# Patient Record
Sex: Female | Born: 1965
Health system: Southern US, Community
[De-identification: ages and names within clinical notes are randomized; demographics above are authoritative.]

## PROBLEM LIST (undated history)

## (undated) DIAGNOSIS — M25562 Pain in left knee: Secondary | ICD-10-CM

## (undated) DIAGNOSIS — F32A Depression, unspecified: Secondary | ICD-10-CM

## (undated) DIAGNOSIS — R519 Headache, unspecified: Secondary | ICD-10-CM

## (undated) DIAGNOSIS — R51 Headache: Secondary | ICD-10-CM

## (undated) DIAGNOSIS — E7800 Pure hypercholesterolemia, unspecified: Secondary | ICD-10-CM

## (undated) DIAGNOSIS — M25519 Pain in unspecified shoulder: Secondary | ICD-10-CM

## (undated) DIAGNOSIS — E559 Vitamin D deficiency, unspecified: Secondary | ICD-10-CM

## (undated) DIAGNOSIS — F419 Anxiety disorder, unspecified: Secondary | ICD-10-CM

## (undated) DIAGNOSIS — M797 Fibromyalgia: Secondary | ICD-10-CM

## (undated) DIAGNOSIS — I700 Atherosclerosis of aorta: Secondary | ICD-10-CM

## (undated) DIAGNOSIS — D649 Anemia, unspecified: Secondary | ICD-10-CM

## (undated) DIAGNOSIS — R300 Dysuria: Secondary | ICD-10-CM

## (undated) DIAGNOSIS — F329 Major depressive disorder, single episode, unspecified: Secondary | ICD-10-CM

## (undated) DIAGNOSIS — N946 Dysmenorrhea, unspecified: Secondary | ICD-10-CM

## (undated) DIAGNOSIS — Q210 Ventricular septal defect: Secondary | ICD-10-CM

## (undated) DIAGNOSIS — Z0000 Encounter for general adult medical examination without abnormal findings: Secondary | ICD-10-CM

## (undated) DIAGNOSIS — G43909 Migraine, unspecified, not intractable, without status migrainosus: Secondary | ICD-10-CM

## (undated) DIAGNOSIS — Z79899 Other long term (current) drug therapy: Secondary | ICD-10-CM

## (undated) DIAGNOSIS — Q211 Atrial septal defect: Secondary | ICD-10-CM

## (undated) DIAGNOSIS — R5381 Other malaise: Secondary | ICD-10-CM

## (undated) DIAGNOSIS — E781 Pure hyperglyceridemia: Secondary | ICD-10-CM

## (undated) DIAGNOSIS — R5383 Other fatigue: Principal | ICD-10-CM

## (undated) DIAGNOSIS — R319 Hematuria, unspecified: Secondary | ICD-10-CM

## (undated) HISTORY — DX: Vitamin D deficiency, unspecified: E55.9

## (undated) HISTORY — DX: Migraine, unspecified, not intractable, without status migrainosus: G43.909

## (undated) HISTORY — DX: Dysuria: R30.0

## (undated) HISTORY — DX: Ventricular septal defect: Q21.0

## (undated) HISTORY — DX: Pain in unspecified shoulder: M25.519

## (undated) HISTORY — DX: Depression, unspecified: F32.A

## (undated) HISTORY — DX: Headache, unspecified: R51.9

## (undated) HISTORY — DX: Pain in left knee: M25.562

## (undated) HISTORY — DX: Anemia, unspecified: D64.9

## (undated) HISTORY — DX: Headache: R51

## (undated) HISTORY — PX: OTHER SURGICAL HISTORY: SHX169

## (undated) HISTORY — DX: Other malaise: R53.81

## (undated) HISTORY — PX: CARDIAC SURGERY: SHX584

## (undated) HISTORY — DX: Major depressive disorder, single episode, unspecified: F32.9

## (undated) HISTORY — PX: KNEE ARTHROSCOPY W/ ACL RECONSTRUCTION AND HAMSTRING GRAFT: SHX1860

## (undated) HISTORY — DX: Hematuria, unspecified: R31.9

## (undated) HISTORY — DX: Encounter for general adult medical examination without abnormal findings: Z00.00

## (undated) HISTORY — DX: Dysmenorrhea, unspecified: N94.6

## (undated) HISTORY — DX: Atrial septal defect: Q21.1

## (undated) HISTORY — DX: Other fatigue: R53.83

## (undated) HISTORY — DX: Pure hyperglyceridemia: E78.1

## (undated) HISTORY — DX: Atherosclerosis of aorta: I70.0

## (undated) HISTORY — DX: Other long term (current) drug therapy: Z79.899

## (undated) HISTORY — DX: Anxiety disorder, unspecified: F41.9

---

## 1998-02-22 ENCOUNTER — Other Ambulatory Visit: Admission: RE | Admit: 1998-02-22 | Discharge: 1998-02-22 | Payer: Self-pay | Admitting: Obstetrics and Gynecology

## 1999-06-26 ENCOUNTER — Other Ambulatory Visit: Admission: RE | Admit: 1999-06-26 | Discharge: 1999-06-26 | Payer: Self-pay | Admitting: Obstetrics and Gynecology

## 2000-05-15 ENCOUNTER — Other Ambulatory Visit: Admission: RE | Admit: 2000-05-15 | Discharge: 2000-05-15 | Payer: Self-pay | Admitting: Obstetrics and Gynecology

## 2000-07-23 ENCOUNTER — Ambulatory Visit (HOSPITAL_COMMUNITY): Admission: RE | Admit: 2000-07-23 | Discharge: 2000-07-23 | Payer: Self-pay | Admitting: Obstetrics & Gynecology

## 2000-07-23 ENCOUNTER — Encounter: Payer: Self-pay | Admitting: Obstetrics & Gynecology

## 2000-11-22 ENCOUNTER — Inpatient Hospital Stay (HOSPITAL_COMMUNITY): Admission: AD | Admit: 2000-11-22 | Discharge: 2000-11-22 | Payer: Self-pay | Admitting: Obstetrics and Gynecology

## 2000-12-10 ENCOUNTER — Inpatient Hospital Stay (HOSPITAL_COMMUNITY): Admission: AD | Admit: 2000-12-10 | Discharge: 2000-12-10 | Payer: Self-pay | Admitting: Obstetrics and Gynecology

## 2000-12-13 ENCOUNTER — Inpatient Hospital Stay (HOSPITAL_COMMUNITY): Admission: AD | Admit: 2000-12-13 | Discharge: 2000-12-16 | Payer: Self-pay | Admitting: Obstetrics and Gynecology

## 2001-10-13 ENCOUNTER — Other Ambulatory Visit: Admission: RE | Admit: 2001-10-13 | Discharge: 2001-10-13 | Payer: Self-pay | Admitting: Obstetrics and Gynecology

## 2004-06-13 ENCOUNTER — Other Ambulatory Visit: Admission: RE | Admit: 2004-06-13 | Discharge: 2004-06-13 | Payer: Self-pay | Admitting: Obstetrics and Gynecology

## 2004-06-28 ENCOUNTER — Ambulatory Visit: Payer: Self-pay | Admitting: Family Medicine

## 2006-05-09 ENCOUNTER — Inpatient Hospital Stay (HOSPITAL_COMMUNITY): Admission: AD | Admit: 2006-05-09 | Discharge: 2006-05-09 | Payer: Self-pay | Admitting: Obstetrics and Gynecology

## 2006-05-29 ENCOUNTER — Inpatient Hospital Stay (HOSPITAL_COMMUNITY): Admission: AD | Admit: 2006-05-29 | Discharge: 2006-05-30 | Payer: Self-pay | Admitting: Obstetrics and Gynecology

## 2008-11-11 ENCOUNTER — Encounter: Admission: RE | Admit: 2008-11-11 | Discharge: 2008-11-11 | Payer: Self-pay | Admitting: Family Medicine

## 2009-01-20 ENCOUNTER — Ambulatory Visit (HOSPITAL_COMMUNITY): Admission: RE | Admit: 2009-01-20 | Discharge: 2009-01-20 | Payer: Self-pay | Admitting: Family Medicine

## 2012-12-10 ENCOUNTER — Encounter: Payer: Self-pay | Admitting: Neurology

## 2012-12-10 DIAGNOSIS — R5381 Other malaise: Secondary | ICD-10-CM

## 2012-12-10 DIAGNOSIS — R319 Hematuria, unspecified: Secondary | ICD-10-CM

## 2012-12-10 DIAGNOSIS — M25519 Pain in unspecified shoulder: Secondary | ICD-10-CM | POA: Insufficient documentation

## 2012-12-10 DIAGNOSIS — F419 Anxiety disorder, unspecified: Secondary | ICD-10-CM

## 2012-12-10 DIAGNOSIS — E781 Pure hyperglyceridemia: Secondary | ICD-10-CM

## 2012-12-10 DIAGNOSIS — F329 Major depressive disorder, single episode, unspecified: Secondary | ICD-10-CM | POA: Insufficient documentation

## 2012-12-10 DIAGNOSIS — E559 Vitamin D deficiency, unspecified: Secondary | ICD-10-CM

## 2012-12-10 DIAGNOSIS — Z0000 Encounter for general adult medical examination without abnormal findings: Secondary | ICD-10-CM

## 2012-12-10 DIAGNOSIS — R519 Headache, unspecified: Secondary | ICD-10-CM

## 2012-12-10 DIAGNOSIS — G43909 Migraine, unspecified, not intractable, without status migrainosus: Secondary | ICD-10-CM

## 2012-12-10 DIAGNOSIS — F32A Depression, unspecified: Secondary | ICD-10-CM

## 2012-12-10 DIAGNOSIS — N946 Dysmenorrhea, unspecified: Secondary | ICD-10-CM

## 2012-12-10 DIAGNOSIS — Z79899 Other long term (current) drug therapy: Secondary | ICD-10-CM

## 2012-12-10 DIAGNOSIS — R300 Dysuria: Secondary | ICD-10-CM

## 2012-12-11 ENCOUNTER — Ambulatory Visit (INDEPENDENT_AMBULATORY_CARE_PROVIDER_SITE_OTHER): Payer: 59 | Admitting: Neurology

## 2012-12-11 ENCOUNTER — Encounter: Payer: Self-pay | Admitting: Neurology

## 2012-12-11 VITALS — BP 110/60 | HR 65 | Ht 65.5 in | Wt 152.0 lb

## 2012-12-11 DIAGNOSIS — F419 Anxiety disorder, unspecified: Secondary | ICD-10-CM

## 2012-12-11 DIAGNOSIS — F329 Major depressive disorder, single episode, unspecified: Secondary | ICD-10-CM

## 2012-12-11 DIAGNOSIS — G43909 Migraine, unspecified, not intractable, without status migrainosus: Secondary | ICD-10-CM

## 2012-12-11 DIAGNOSIS — F411 Generalized anxiety disorder: Secondary | ICD-10-CM

## 2012-12-11 MED ORDER — RIZATRIPTAN BENZOATE 10 MG PO TBDP: 10.0000 mg | ORAL_TABLET | ORAL | Status: DC | PRN

## 2012-12-11 MED ORDER — TOPIRAMATE 100 MG PO TABS: 100.0000 mg | ORAL_TABLET | Freq: Two times a day (BID) | ORAL | Status: DC

## 2012-12-11 NOTE — Progress Notes (Signed)
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 She used to have migraine headaches in her twenties, clustered around her menstruation period of time, Imitrex has been very helpful.  She underwent left knee arthroscopic surgery in November 2013, ever since then, she was no longer do same, she began to complain more frequent headaches, at the right parietal region, sometimes at the left side, severe pounding headaches, with associated light noise sensitivity, lasting for days to weeks, almost daily basis,   Over the past few months, she has tried different medications, Prozac, nortriptyline, mood swing, sleepy next morning, she has been taking topiramate 25 mg twice a day since July, which has helped her headache mildly  But she also complains of memory loss, difficulty focusing, 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   Most recent laboratory evaluation normal CMP, CBC, TSH, mild elevated LDL 141, negative HIV, A1c 5.1.   REVIEW OF SYSTEMS: Full 14 system review of systems performed and notable only for fatigue, ringing in ears, spinning sensation, blurred vision, double vision, eye pain, constipation, urination problems, feeling hot, memory loss, confusion, headaches, numbness, slurred speech, dizziness, tremor, insomnia, sleepiness, depression, anxiety, decreased energy.  ALLERGIES: Allergies  Allergen Reactions  . Latex   . Sulfa Antibiotics     HOME MEDICATIONS: Outpatient Prescriptions Prior to Visit  Medication Sig Dispense Refill  . FLUoxetine (PROZAC) 40 MG capsule Take 40 mg by mouth daily.      . SUMAtriptan (IMITREX) 50 MG tablet Take  50 mg by mouth every 2 (two) hours as needed for migraine.      . topiramate (TOPAMAX) 25 MG tablet Take 25 mg by mouth 2 (two) times daily.         PAST MEDICAL HISTORY: Past Medical History  Diagnosis Date  . Other malaise and fatigue   . Shoulder pain   . Dysmenorrhea   . Left knee pain   . Dysuria   . Hematuria   . Anxiety   . Vitamin D deficiency   . Pure hyperglyceridemia   . Vitamin D deficiency   . Headache   . Migraine   . Depression,      PAST SURGICAL HISTORY: Past Surgical History  Procedure Laterality Date  . C seaction    . Vetricular hole open heart surgery      FAMILY HISTORY: Family History  Problem Relation Age of Onset  . Depression Mother   . High blood pressure Mother   . Hyperlipidemia Mother   . Anxiety disorder Mother   . High blood pressure Father   . Hyperlipidemia Father     SOCIAL HISTORY:  History   Social History  . Marital Status: Married    Spouse Name: N/A    Number of Children: N/A  . Years of Education: N/A   Occupational History  . Register Nurse   Social History Main Topics  . Smoking status: Former Games developer  . Smokeless tobacco: Never Used  . Alcohol Use: No  . Drug Use: No  . Sexual Activity: Not on file    PHYSICAL EXAM    Filed Vitals:   12/11/12 0832  BP: 110/60  Pulse:  65  Height: 5' 5.5" (1.664 m)  Weight: 152 lb (68.947 kg)    Body mass index is 24.9 kg/(m^2).   Generalized: In no acute distress  Neck: Supple, no carotid bruits   Cardiac: Regular rate rhythm  Pulmonary: Clear to auscultation bilaterally  Musculoskeletal: No deformity  Neurological examination  Mentation: Alert oriented to time, place, history taking, and causual conversation  Cranial nerve II-XII: Pupils were equal round reactive to light extraocular movements were full, visual field were full on confrontational test. facial sensation and strength were normal. hearing was intact to finger rubbing bilaterally. Uvula  tongue midline.  head turning and shoulder shrug and were normal and symmetric.Tongue protrusion into cheek strength was normal.  Motor: normal tone, bulk and strength.  Sensory: Intact to fine touch, pinprick, preserved vibratory sensation, and proprioception at toes.  Coordination: Normal finger to nose, heel-to-shin bilaterally there was no truncal ataxia  Gait: Rising up from seated position without assistance, normal stance, without trunk ataxia, moderate stride, good arm swing, smooth turning, able to perform tiptoe, and heel walking without difficulty.   Romberg signs: Negative  Deep tendon reflexes: Brachioradialis 2/2, biceps 2/2, triceps 2/2, patellar 2/2, Achilles 2/2, plantar responses were flexor bilaterally.   DIAGNOSTIC DATA (LABS, IMAGING, TESTING) - I reviewed patient records, labs, notes, testing and imaging myself where available.    ASSESSMENT AND PLAN    47 years old right-handed Caucasian female, with past medical history of migraine headaches, now presenting with increased frequency almost daily headaches, and also difficulty concentrating she also complains of depression anxiety, confusion, memory loss, Mini-Mental Status Examination is 30 out of 30, exacerbated gait difficulty on examination, but with excellent balance.  1.  Differentiation diagnosis includes mood disorder, migraine headaches, 2.  Will increase her topiramate 100 mg twice a day 3.  MRI of the brain with and without contrast 4.  Return to clinic with Joanna Anderson in 3 months.  Joanna Anderson, M.D. Ph.D.  [facility] [address] [place], [place] [address] [phone]

## 2012-12-17 ENCOUNTER — Ambulatory Visit: Payer: 59 | Admitting: Neurology

## 2012-12-18 ENCOUNTER — Ambulatory Visit (INDEPENDENT_AMBULATORY_CARE_PROVIDER_SITE_OTHER): Payer: 59

## 2012-12-18 DIAGNOSIS — F419 Anxiety disorder, unspecified: Secondary | ICD-10-CM

## 2012-12-18 DIAGNOSIS — F411 Generalized anxiety disorder: Secondary | ICD-10-CM

## 2012-12-18 DIAGNOSIS — G43909 Migraine, unspecified, not intractable, without status migrainosus: Secondary | ICD-10-CM

## 2012-12-18 DIAGNOSIS — F329 Major depressive disorder, single episode, unspecified: Secondary | ICD-10-CM

## 2012-12-22 ENCOUNTER — Telehealth: Payer: Self-pay | Admitting: Neurology

## 2012-12-22 NOTE — Telephone Encounter (Signed)
Message copied by Demetrios Loll on Mon Dec 22, 2012  2:23 PM ------      Message from: Levert Feinstein      Created: Mon Dec 22, 2012 12:52 PM       Please call patient, mild abnormality related to her history of migraine, otherwise, normal. ------

## 2012-12-22 NOTE — Progress Notes (Signed)
Quick Note:  Please call patient, mild abnormality related to her history of migraine, otherwise, normal. ______

## 2012-12-22 NOTE — Telephone Encounter (Signed)
I have called Joanna Anderson at [phone] twice, trying to explain MRI findings to her, left message.  Mild changes related to migraine.

## 2012-12-22 NOTE — Telephone Encounter (Signed)
Called pt, no answer, left message to call back.

## 2012-12-22 NOTE — Telephone Encounter (Signed)
Pt called back and explain to the pt her results of the MRI. Pt had more questions and concerns. Pt wanted Dr. Terrace Arabia to return her phone call at [phone].

## 2013-04-08 ENCOUNTER — Ambulatory Visit: Payer: 59 | Admitting: Nurse Practitioner

## 2013-12-17 ENCOUNTER — Ambulatory Visit (INDEPENDENT_AMBULATORY_CARE_PROVIDER_SITE_OTHER): Payer: PRIVATE HEALTH INSURANCE | Admitting: Family Medicine

## 2013-12-17 VITALS — BP 142/78 | HR 71 | Temp 98.1°F | Resp 16 | Ht 66.25 in | Wt 159.4 lb

## 2013-12-17 DIAGNOSIS — R109 Unspecified abdominal pain: Secondary | ICD-10-CM

## 2013-12-17 LAB — POCT CBC
Granulocyte percent: 69.6 %G (ref 37–80)
HEMATOCRIT: 41.3 % (ref 37.7–47.9)
HEMOGLOBIN: 13.7 g/dL (ref 12.2–16.2)
LYMPH, POC: 2.9 (ref 0.6–3.4)
MCH, POC: 29.6 pg (ref 27–31.2)
MCHC: 33 g/dL (ref 31.8–35.4)
MCV: 89.6 fL (ref 80–97)
MID (cbc): 0.8 (ref 0–0.9)
MPV: 8.6 fL (ref 0–99.8)
POC GRANULOCYTE: 8.4 — AB (ref 2–6.9)
POC LYMPH PERCENT: 24.1 %L (ref 10–50)
POC MID %: 6.3 % (ref 0–12)
Platelet Count, POC: 186 10*3/uL (ref 142–424)
RBC: 4.61 M/uL (ref 4.04–5.48)
RDW, POC: 12.6 %
WBC: 12 10*3/uL — AB (ref 4.6–10.2)

## 2013-12-17 NOTE — Progress Notes (Addendum)
[facility] [address] [place] [address] [id number]  Date:  12/17/2013   Name:  Joanna Anderson   DOB:  04/10/1965   MRN:  [id number]  PCP:  Dennard Schaumann, MD    Chief Complaint: Abdominal Pain, Diarrhea, Nausea and Chills   History of Present Illness:  Joanna Anderson is a 48 y.o. very pleasant female patient who presents with the following:  2 days ago she felt quite nauseated, bloated, dull pain in her belly.  Yesterday she had more pain, today the pain continues. Her sx "really started about a month ago-" she had a period of "pooping straight mucus" for a couple of days.  She was still eating normally.  Then she had bad diarrhea for 5 days or so, but she was able to work and eat normally.  Since then her belly has not felt right, the mucus never totally resolved.  Over the last 5 days she has noted chills and felt tired. She has noted a low grade fever- she was 99.4 today. She has not noted any blood in her stool.   Yesterday she took miralax.   She has not been able to eat today.  She has drunk a little bit of fluid.     About 5 years ago she had a CT which showed diverticulitis and segmental sigmoid colonic wall thickening.   CT from 2010   IMPRESSION:  No acute intra-abdominal finding. Please see CT pelvis report  below.  CT PELVIS  Findings: There is short segmental sigmoid colonic wall thickening  with surrounding pericolonic stranding and trace fluid tracking  inferiorly to the pelvis. No definite pericolonic abscess  formation is seen. An exophytic nodule on the superior aspect of  this abnormal appearing loop of bowel most likely represents an  inflamed diverticulum. The uterus has a lobulated contour which  may suggest underlying fibroids. Ovaries are normal in appearance.  Small bowel is unremarkable. No acute osseous finding.  IMPRESSION:  Short segmental sigmoid colonic wall thickening, pericolonic  stranding, and free fluid,  likely indicative of diverticulitis,  much less likely mass lesion, without free air or definite  pericolonic abscess formation.  She has not yet seen GI.  She will feel fine and then forget about her sx, but knows that she likely needs to see GI soon.   Her father had maybe Crohns disease?   Patient Active Problem List   Diagnosis Date Noted  . Other malaise and fatigue   . Routine general medical examination at a health care facility   . Shoulder pain   . Dysmenorrhea   . Dysuria   . Hematuria   . Anxiety   . Encounter for long-term (current) use of other medications   . Vitamin D deficiency   . Pure hyperglyceridemia   . Headache   . Migraine   . Depression     Past Medical History  Diagnosis Date  . Other malaise and fatigue   . Routine general medical examination at a health care facility   . Shoulder pain   . Dysmenorrhea   . Left knee pain   . Dysuria   . Hematuria   . Anxiety   . Encounter for long-term (current) use of other medications   . Vitamin D deficiency   . Pure hyperglyceridemia   . Vitamin D deficiency   . Headache   . Migraine   . Depression   . Anemia     Past  Surgical History  Procedure Laterality Date  . C seaction    . Vetricular hole      History  Substance Use Topics  . Smoking status: Former Games developer  . Smokeless tobacco: Never Used  . Alcohol Use: No    Family History  Problem Relation Age of Onset  . Depression Mother   . High blood pressure Mother   . Hyperlipidemia Mother   . Anxiety disorder Mother   . Hypertension Mother   . High blood pressure Father   . Hyperlipidemia Father   . Hypertension Father     Allergies  Allergen Reactions  . Latex   . Sulfa Antibiotics     Medication list has been reviewed and updated.  Current Outpatient Prescriptions on File Prior to Visit  Medication Sig Dispense Refill  . FLUoxetine (PROZAC) 40 MG capsule Take 40 mg by mouth daily.      . rizatriptan (MAXALT-MLT) 10 MG  disintegrating tablet Take 1 tablet (10 mg total) by mouth as needed for migraine. May repeat in 2 hours if needed  16 tablet  12  . SUMAtriptan (IMITREX) 50 MG tablet Take 50 mg by mouth every 2 (two) hours as needed for migraine.      . topiramate (TOPAMAX) 100 MG tablet Take 1 tablet (100 mg total) by mouth 2 (two) times daily.  60 tablet  12   No current facility-administered medications on file prior to visit.    Review of Systems:  As per HPI- otherwise negative.   Physical Examination: Filed Vitals:   12/17/13 1737  BP: 142/78  Pulse: 71  Temp: 98.1 F (36.7 C)  Resp: 16   Filed Vitals:   12/17/13 1737  Height: 5' 6.25" (1.683 m)  Weight: 159 lb 6.4 oz (72.303 kg)   Body mass index is 25.53 kg/(m^2). Ideal Body Weight: Weight in (lb) to have BMI = 25: 155.7  GEN: WDWN, NAD, Non-toxic, A & O x 3 HEENT: Atraumatic, Normocephalic. Neck supple. No masses, No LAD. Ears and Nose: No external deformity. CV: RRR, No M/G/R. No JVD. No thrill. No extra heart sounds. PULM: CTA B, no wheezes, crackles, rhonchi. No retractions. No resp. distress. No accessory muscle use. ABD: S,  ND, +BS. No rebound. No HSM.  Diffuse abdominal tenderness worse over the lower quadrants  EXTR: No c/c/e NEURO Normal gait.  PSYCH: Normally interactive. Conversant. Not depressed or anxious appearing.  Calm demeanor.    Assessment and Plan: Abdominal pain, other specified site - Plan: POCT CBC, POCT UA - Microscopic Only, POCT urinalysis dipstick, POCT urine pregnancy, Comprehensive metabolic panel  As it turns out she is a [employer] employee, she would like to go there as she likely needs a CT scan and this will be less expensive for her.  She will go to the [facility] ED now; she feels able to driver herself. Given a copy of her records and called ahead to ED for her.    Signed Abbe Amsterdam, MD

## 2013-12-18 LAB — COMPREHENSIVE METABOLIC PANEL
ALBUMIN: 4.3 g/dL (ref 3.5–5.2)
ALT: 19 U/L (ref 0–35)
AST: 15 U/L (ref 0–37)
Alkaline Phosphatase: 58 U/L (ref 39–117)
BUN: 9 mg/dL (ref 6–23)
CALCIUM: 9.2 mg/dL (ref 8.4–10.5)
CHLORIDE: 101 meq/L (ref 96–112)
CO2: 27 meq/L (ref 19–32)
Creat: 0.69 mg/dL (ref 0.50–1.10)
GLUCOSE: 83 mg/dL (ref 70–99)
POTASSIUM: 3.8 meq/L (ref 3.5–5.3)
SODIUM: 138 meq/L (ref 135–145)
TOTAL PROTEIN: 7.2 g/dL (ref 6.0–8.3)
Total Bilirubin: 1.1 mg/dL (ref 0.2–1.2)

## 2013-12-20 ENCOUNTER — Encounter: Payer: Self-pay | Admitting: Family Medicine

## 2014-03-11 ENCOUNTER — Ambulatory Visit (INDEPENDENT_AMBULATORY_CARE_PROVIDER_SITE_OTHER): Payer: Commercial Managed Care - PPO | Admitting: Family Medicine

## 2014-03-11 VITALS — BP 120/70 | HR 78 | Temp 98.4°F | Resp 16 | Ht 66.5 in | Wt 164.2 lb

## 2014-03-11 DIAGNOSIS — K279 Peptic ulcer, site unspecified, unspecified as acute or chronic, without hemorrhage or perforation: Secondary | ICD-10-CM

## 2014-03-11 DIAGNOSIS — F411 Generalized anxiety disorder: Secondary | ICD-10-CM

## 2014-03-11 DIAGNOSIS — G43101 Migraine with aura, not intractable, with status migrainosus: Secondary | ICD-10-CM

## 2014-03-11 MED ORDER — CLONAZEPAM 0.5 MG PO TABS: 0.5000 mg | ORAL_TABLET | Freq: Two times a day (BID) | ORAL | Status: DC | PRN

## 2014-03-11 MED ORDER — RIZATRIPTAN BENZOATE 10 MG PO TBDP: 10.0000 mg | ORAL_TABLET | ORAL | Status: DC | PRN

## 2014-03-11 MED ORDER — TIZANIDINE HCL 4 MG PO TABS: 2.0000 mg | ORAL_TABLET | Freq: Three times a day (TID) | ORAL | Status: DC | PRN

## 2014-03-11 NOTE — Patient Instructions (Signed)
Use the Klonopin when needed  Use the Maxalt when an obvious migraine is coming on.  If for a migraine sets in and is persisting, try the tizanidine one half tablet every 8 hours. May cause drowsiness. Use initially when not working.  Return if worse at anytime, otherwise in about 3 months please return so we can review how you are doing.

## 2014-03-11 NOTE — Progress Notes (Signed)
Subjective: 48 year old lady here because of her migraines. She had a bad one last night with a lot of aura associated with it. She took some promethazine today for it. She has a history of having used Maxalt successfully in the past, but has not had any of late. She is seeing a neurologist in the past, but did not feel like the continued increase of various prophylactic medications was helping her. She has a history of chronic GI problems for which she's been evaluated at [facility]. Her reports are available through the care everywhere network. She has a history of some sleep disturbance for which she took some Klonopin, and is using some of it for sleep and anxiety. Her medicine list is in the chart, with the exception of Protonix which did not get listed. She takes 40 mg daily. She has an ulcer, so cannot take NSAIDs. She has had a complete GI workup. H. pylori and stool cultures were negative except first finding a strain of Escherichia coli from her well water which may have been contributing to this. She was treated for that and did better. C. difficile negative.  Currently she takes the Klonopin intermittently, dicyclomine intermittently, Protonix routinely, sucralfate occasionally, and Phenergan fairly often. She has tried tramadol unsuccessfully. Cannot take NSAIDs due to the ulcer.  Objective: Healthy-appearing lady. TMs normal. Eyes PERRLA. EOMs intact. Throat clear. Neck supple without nodes. Chest clear to auscultation. Heart regular without murmurs. Abdomen soft without mass or tenderness.  Assessment: Migraines History of peptic ulcer disease History of anxiety History of sleep disturbance History of chronic intermittent GI distress, in part secondary to a pathogenic Escherichia coli infection  Plan: Discussed her desire to have a regular physician. Explained that she can come in here and ask for me on a routine basis. She can call and find out when I'm here  before coming. We will see how that works for her.  Continue current medications. Give her some Maxalt for when necessary use. Try tizanidine when she has a bad migraine  Return as needed

## 2014-05-17 ENCOUNTER — Ambulatory Visit (INDEPENDENT_AMBULATORY_CARE_PROVIDER_SITE_OTHER): Payer: Commercial Managed Care - PPO | Admitting: Family Medicine

## 2014-05-17 VITALS — BP 82/60 | HR 63 | Temp 98.3°F | Resp 16 | Ht 65.5 in | Wt 162.0 lb

## 2014-05-17 DIAGNOSIS — G43009 Migraine without aura, not intractable, without status migrainosus: Secondary | ICD-10-CM

## 2014-05-17 DIAGNOSIS — R110 Nausea: Secondary | ICD-10-CM

## 2014-05-17 DIAGNOSIS — M1712 Unilateral primary osteoarthritis, left knee: Secondary | ICD-10-CM

## 2014-05-17 DIAGNOSIS — R21 Rash and other nonspecific skin eruption: Secondary | ICD-10-CM

## 2014-05-17 DIAGNOSIS — K279 Peptic ulcer, site unspecified, unspecified as acute or chronic, without hemorrhage or perforation: Secondary | ICD-10-CM

## 2014-05-17 DIAGNOSIS — M25562 Pain in left knee: Secondary | ICD-10-CM

## 2014-05-17 DIAGNOSIS — F411 Generalized anxiety disorder: Secondary | ICD-10-CM

## 2014-05-17 DIAGNOSIS — R1084 Generalized abdominal pain: Secondary | ICD-10-CM

## 2014-05-17 DIAGNOSIS — M179 Osteoarthritis of knee, unspecified: Secondary | ICD-10-CM

## 2014-05-17 DIAGNOSIS — Z0000 Encounter for general adult medical examination without abnormal findings: Secondary | ICD-10-CM

## 2014-05-17 DIAGNOSIS — G8929 Other chronic pain: Secondary | ICD-10-CM

## 2014-05-17 LAB — POCT CBC
GRANULOCYTE PERCENT: 66.1 % (ref 37–80)
HCT, POC: 38.9 % (ref 37.7–47.9)
Hemoglobin: 12.6 g/dL (ref 12.2–16.2)
Lymph, poc: 2.3 (ref 0.6–3.4)
MCH, POC: 29.2 pg (ref 27–31.2)
MCHC: 32.4 g/dL (ref 31.8–35.4)
MCV: 90.2 fL (ref 80–97)
MID (CBC): 0.6 (ref 0–0.9)
MPV: 7.9 fL (ref 0–99.8)
POC GRANULOCYTE: 5.7 (ref 2–6.9)
POC LYMPH PERCENT: 26.8 %L (ref 10–50)
POC MID %: 7.1 %M (ref 0–12)
Platelet Count, POC: 185 10*3/uL (ref 142–424)
RBC: 4.31 M/uL (ref 4.04–5.48)
RDW, POC: 13.6 %
WBC: 8.6 10*3/uL (ref 4.6–10.2)

## 2014-05-17 LAB — POCT GLYCOSYLATED HEMOGLOBIN (HGB A1C): Hemoglobin A1C: 5.2

## 2014-05-17 MED ORDER — CLINDAMYCIN PHOSPHATE 1 % EX GEL: Freq: Two times a day (BID) | CUTANEOUS | Status: DC

## 2014-05-17 MED ORDER — ONDANSETRON 8 MG PO TBDP: 8.0000 mg | ORAL_TABLET | Freq: Three times a day (TID) | ORAL | Status: DC | PRN

## 2014-05-17 MED ORDER — CLONAZEPAM 0.5 MG PO TABS
0.5000 mg | ORAL_TABLET | Freq: Two times a day (BID) | ORAL | Status: DC | PRN
Start: 2014-05-17 — End: 2014-07-19

## 2014-05-17 MED ORDER — DICLOFENAC SODIUM 1 % TD GEL: 2.0000 g | Freq: Four times a day (QID) | TRANSDERMAL | Status: DC

## 2014-05-17 NOTE — Progress Notes (Signed)
Physical examination:  History: Patient is here for a physical examination. She has no major acute complaints  Past medical history: Surgeries: Had surgery for a congenital heart atrial septal ventricular defect when she was a child. Has had surgery left knee  major illnesses: Chronic migraines, peptic ulcer disease, anxiety disorder Medications: See chart Allergies: Sulfa and latex  Family history: No major congenital problems  Social history: Not using substances  Review of systems: HEENT: Unremarkable Constitutional: Unremarkable Does not get a lot of regular physical exercise Cardiovascular: Unremarkable, no problems from her heart, has been followed up with a cardiologist a couple of years ago Respiratory: Unremarkable Gastrointestinal: Chronic abdominal pain, has the ulcer. Consideration was given to getting a HIDA scan but it was never done Genitourinary: Unremarkable Muscular skeletal: Unremarkable Endocrine: Unremarkable Dermatologic: Unremarkable Neurologic: Migraines as discussed above  Physical exam: Healthy-appearing lady in no major distress. TMs normal. Eyes PERRLA. Throat clear. Neck supple without nodes. Chest clear. Heart regular without murmurs. Abdomen soft without mass or tenderness. Breasts are symmetrical without any nodules. Small breast. No axillary nodes. Extremities normal. Skin normal.  Assessment: Normal physical examination Migraines, chronic Peptic ulcer disease Chronic abdominal pain History of congenital heart disease Rash on forehead, DJD and chronic pain left knee  Plan: Diclofenac gel for the knee Clindamycin gel for the forehead See someone at the [facility] for evaluation of the migraines. She did have some mild abnormality on the MRI she needs to discuss with them also Probably does not need a mammogram until she is age 49. No family history Wait another year or 2 for a Pap smear     Results for orders placed or  performed in visit on 05/17/14  POCT CBC  Result Value Ref Range   WBC 8.6 4.6 - 10.2 K/uL   Lymph, poc 2.3 0.6 - 3.4   POC LYMPH PERCENT 26.8 10 - 50 %L   MID (cbc) 0.6 0 - 0.9   POC MID % 7.1 0 - 12 %M   POC Granulocyte 5.7 2 - 6.9   Granulocyte percent 66.1 37 - 80 %G   RBC 4.31 4.04 - 5.48 M/uL   Hemoglobin 12.6 12.2 - 16.2 g/dL   HCT, POC 40.938.9 81.137.7 - 47.9 %   MCV 90.2 80 - 97 fL   MCH, POC 29.2 27 - 31.2 pg   MCHC 32.4 31.8 - 35.4 g/dL   RDW, POC 91.413.6 %   Platelet Count, POC 185 142 - 424 K/uL   MPV 7.9 0 - 99.8 fL  POCT glycosylated hemoglobin (Hb A1C)  Result Value Ref Range   Hemoglobin A1C 5.2    Other labs are all pending

## 2014-05-17 NOTE — Patient Instructions (Addendum)
Use the diclofenac gel 2-4 times daily rubbed into the knee  Tried to get the exercise that you safely can without impacting your knee badly  Use the clindamycin gel on your face once or twice daily after washing face  Referral is being made to a neurologist  Return back to your gastroenterologist for further assessment of the ulcer and possible additional workup  Return in one year for physical

## 2014-05-18 LAB — LIPID PANEL
Cholesterol: 207 mg/dL — ABNORMAL HIGH (ref 0–200)
HDL: 46 mg/dL (ref >39)
LDL Cholesterol: 133 mg/dL — ABNORMAL HIGH (ref 0–99)
TRIGLYCERIDES: 141 mg/dL (ref <150)
Total CHOL/HDL Ratio: 4.5 Ratio
VLDL: 28 mg/dL (ref 0–40)

## 2014-05-18 LAB — TSH: TSH: 1.012 u[IU]/mL (ref 0.350–4.500)

## 2014-05-28 ENCOUNTER — Telehealth: Payer: Self-pay

## 2014-05-28 DIAGNOSIS — G43009 Migraine without aura, not intractable, without status migrainosus: Secondary | ICD-10-CM

## 2014-05-28 NOTE — Telephone Encounter (Signed)
Pt is thinking that r hopper was referring her to the neurologist that his wife goes to   Pt is having headaches daily

## 2014-05-28 NOTE — Telephone Encounter (Signed)
Can we refer for headaches? Please advise.

## 2014-05-29 NOTE — Telephone Encounter (Signed)
Call I had intended for her to be referred to [facility] re: headaches.  Please do so for me. Apologize to her that this did not get done last week.

## 2014-05-31 NOTE — Telephone Encounter (Signed)
Referral placed. Called to let her know. Left message.

## 2014-06-03 ENCOUNTER — Telehealth: Payer: Self-pay | Admitting: Family Medicine

## 2014-06-03 NOTE — Telephone Encounter (Signed)
Patient is requesting Tramadol to help with her knee and back pain. States that she and Dr. Alwyn RenHopper discussed her getting on this medication to help ease her pain. [facility].  [phone]

## 2014-06-04 NOTE — Telephone Encounter (Signed)
Patient is calling back asking for pain relief.  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   [facility]   [phone]  Call patient when it is called in.    [facility] [address]

## 2014-06-04 NOTE — Telephone Encounter (Signed)
Please send in a prescription for her for Tramadol 50 mg  One bid prn severe pain only   #40   One refill.  She needs to come in after that for further refills.  Recommended for only when needed for bad pain, should try to minimize use.

## 2014-06-05 ENCOUNTER — Emergency Department (HOSPITAL_COMMUNITY)
Admission: EM | Admit: 2014-06-05 | Discharge: 2014-06-05 | Disposition: A | Discharge status: Home / Self Care | Payer: 59 | Attending: Emergency Medicine | Admitting: Emergency Medicine

## 2014-06-05 ENCOUNTER — Emergency Department (HOSPITAL_COMMUNITY): Payer: 59

## 2014-06-05 ENCOUNTER — Encounter (HOSPITAL_COMMUNITY): Payer: Self-pay | Admitting: *Deleted

## 2014-06-05 DIAGNOSIS — R079 Chest pain, unspecified: Secondary | ICD-10-CM | POA: Diagnosis present

## 2014-06-05 DIAGNOSIS — Z87891 Personal history of nicotine dependence: Secondary | ICD-10-CM | POA: Diagnosis not present

## 2014-06-05 DIAGNOSIS — Z8742 Personal history of other diseases of the female genital tract: Secondary | ICD-10-CM | POA: Insufficient documentation

## 2014-06-05 DIAGNOSIS — M542 Cervicalgia: Secondary | ICD-10-CM | POA: Insufficient documentation

## 2014-06-05 DIAGNOSIS — F419 Anxiety disorder, unspecified: Secondary | ICD-10-CM | POA: Diagnosis not present

## 2014-06-05 DIAGNOSIS — Z791 Long term (current) use of non-steroidal anti-inflammatories (NSAID): Secondary | ICD-10-CM | POA: Diagnosis not present

## 2014-06-05 DIAGNOSIS — R011 Cardiac murmur, unspecified: Secondary | ICD-10-CM | POA: Diagnosis not present

## 2014-06-05 DIAGNOSIS — G43909 Migraine, unspecified, not intractable, without status migrainosus: Secondary | ICD-10-CM | POA: Diagnosis not present

## 2014-06-05 DIAGNOSIS — Z79899 Other long term (current) drug therapy: Secondary | ICD-10-CM | POA: Insufficient documentation

## 2014-06-05 DIAGNOSIS — Z792 Long term (current) use of antibiotics: Secondary | ICD-10-CM | POA: Insufficient documentation

## 2014-06-05 DIAGNOSIS — Z862 Personal history of diseases of the blood and blood-forming organs and certain disorders involving the immune mechanism: Secondary | ICD-10-CM | POA: Insufficient documentation

## 2014-06-05 DIAGNOSIS — Z8639 Personal history of other endocrine, nutritional and metabolic disease: Secondary | ICD-10-CM | POA: Diagnosis not present

## 2014-06-05 HISTORY — DX: Pure hypercholesterolemia, unspecified: E78.00

## 2014-06-05 LAB — CBC WITH DIFFERENTIAL/PLATELET
BASOS ABS: 0 10*3/uL (ref 0.0–0.1)
BASOS PCT: 0 % (ref 0–1)
EOS ABS: 0.2 10*3/uL (ref 0.0–0.7)
EOS PCT: 3 % (ref 0–5)
HCT: 40.5 % (ref 36.0–46.0)
Hemoglobin: 13.9 g/dL (ref 12.0–15.0)
LYMPHS ABS: 2.5 10*3/uL (ref 0.7–4.0)
Lymphocytes Relative: 40 % (ref 12–46)
MCH: 30.1 pg (ref 26.0–34.0)
MCHC: 34.3 g/dL (ref 30.0–36.0)
MCV: 87.7 fL (ref 78.0–100.0)
Monocytes Absolute: 0.5 10*3/uL (ref 0.1–1.0)
Monocytes Relative: 8 % (ref 3–12)
NEUTROS PCT: 49 % (ref 43–77)
Neutro Abs: 3.1 10*3/uL (ref 1.7–7.7)
PLATELETS: 195 10*3/uL (ref 150–400)
RBC: 4.62 MIL/uL (ref 3.87–5.11)
RDW: 12.4 % (ref 11.5–15.5)
WBC: 6.2 10*3/uL (ref 4.0–10.5)

## 2014-06-05 LAB — BASIC METABOLIC PANEL
Anion gap: 9 (ref 5–15)
BUN: 9 mg/dL (ref 6–23)
CO2: 28 mmol/L (ref 19–32)
Calcium: 8.8 mg/dL (ref 8.4–10.5)
Chloride: 102 mmol/L (ref 96–112)
Creatinine, Ser: 0.63 mg/dL (ref 0.50–1.10)
GFR calc Af Amer: >90 mL/min (ref >90)
GFR calc non Af Amer: >90 mL/min (ref >90)
GLUCOSE: 89 mg/dL (ref 70–99)
Potassium: 4 mmol/L (ref 3.5–5.1)
Sodium: 139 mmol/L (ref 135–145)

## 2014-06-05 LAB — I-STAT TROPONIN, ED: Troponin i, poc: 0 ng/mL (ref 0.00–0.08)

## 2014-06-05 MED ORDER — SODIUM CHLORIDE 0.9 % IV SOLN
Freq: Once | INTRAVENOUS | Status: AC
  Administered 2014-06-05: 11:00:00 via INTRAVENOUS

## 2014-06-05 MED ORDER — TRAMADOL HCL 50 MG PO TABS: 50.0000 mg | ORAL_TABLET | Freq: Two times a day (BID) | ORAL | Status: DC | PRN

## 2014-06-05 MED ORDER — IOHEXOL 350 MG/ML SOLN
100.0000 mL | Freq: Once | INTRAVENOUS | Status: AC | PRN
  Administered 2014-06-05: 100 mL via INTRAVENOUS

## 2014-06-05 NOTE — Discharge Instructions (Signed)
Chest Pain (Nonspecific) It is often hard to give a specific diagnosis for the cause of chest pain. There is always a chance that your pain could be related to something serious, such as a heart attack or a blood clot in the lungs. You need to follow up with your health care provider for further evaluation. CAUSES   Heartburn.  Pneumonia or bronchitis.  Anxiety or stress.  Inflammation around your heart (pericarditis) or lung (pleuritis or pleurisy).  A blood clot in the lung.  A collapsed lung (pneumothorax). It can develop suddenly on its own (spontaneous pneumothorax) or from trauma to the chest.  Shingles infection (herpes zoster virus). The chest wall is composed of bones, muscles, and cartilage. Any of these can be the source of the pain.  The bones can be bruised by injury.  The muscles or cartilage can be strained by coughing or overwork.  The cartilage can be affected by inflammation and become sore (costochondritis). DIAGNOSIS  Lab tests or other studies may be needed to find the cause of your pain. Your health care provider may have you take a test called an ambulatory electrocardiogram (ECG). An ECG records your heartbeat patterns over a 24-hour period. You may also have other tests, such as:  Transthoracic echocardiogram (TTE). During echocardiography, sound waves are used to evaluate how blood flows through your heart.  Transesophageal echocardiogram (TEE).  Cardiac monitoring. This allows your health care provider to monitor your heart rate and rhythm in real time.  Holter monitor. This is a portable device that records your heartbeat and can help diagnose heart arrhythmias. It allows your health care provider to track your heart activity for several days, if needed.  Stress tests by exercise or by giving medicine that makes the heart beat faster. TREATMENT   Treatment depends on what may be causing your chest pain. Treatment may include:  Acid blockers for  heartburn.  Anti-inflammatory medicine.  Pain medicine for inflammatory conditions.  Antibiotics if an infection is present.  You may be advised to change lifestyle habits. This includes stopping smoking and avoiding alcohol, caffeine, and chocolate.  You may be advised to keep your head raised (elevated) when sleeping. This reduces the chance of acid going backward from your stomach into your esophagus. Most of the time, nonspecific chest pain will improve within 2-3 days with rest and mild pain medicine.  HOME CARE INSTRUCTIONS   If antibiotics were prescribed, take them as directed. Finish them even if you start to feel better.  For the next few days, avoid physical activities that bring on chest pain. Continue physical activities as directed.  Do not use any tobacco products, including cigarettes, chewing tobacco, or electronic cigarettes.  Avoid drinking alcohol.  Only take medicine as directed by your health care provider.  Follow your health care provider's suggestions for further testing if your chest pain does not go away.  Keep any follow-up appointments you made. If you do not go to an appointment, you could develop lasting (chronic) problems with pain. If there is any problem keeping an appointment, call to reschedule. SEEK MEDICAL CARE IF:   Your chest pain does not go away, even after treatment.  You have a rash with blisters on your chest.  You have a fever. SEEK IMMEDIATE MEDICAL CARE IF:   You have increased chest pain or pain that spreads to your arm, neck, jaw, back, or abdomen.  You have shortness of breath.  You have an increasing cough, or you cough   up blood.  You have severe back or abdominal pain.  You feel nauseous or vomit.  You have severe weakness.  You faint.  You have chills. This is an emergency. Do not wait to see if the pain will go away. Get medical help at once. Call your local emergency services (911 in [place] Do not drive  yourself to the hospital. MAKE SURE YOU:   Understand these instructions.  Will watch your condition.  Will get help right away if you are not doing well or get worse. Document Released: 12/27/2004 Document Revised: 03/24/2013 Document Reviewed: 10/23/2007 Exit[employer] Patient Information 2015 [employer]. This information is not intended to replace advice given to you by your health care provider. Make sure you discuss any questions you have with your health care provider. Heart Murmur A heart murmur is an extra sound heard by your health care provider when listening to your heart with a device called a stethoscope. The sound comes from turbulence when blood flows through the heart and may be a "hum" or "whoosh" sound heard when the heart beats. There are two types of heart murmurs:  Innocent murmurs. Most people with this type of heart murmur do not have a heart problem. Many children have innocent heart murmurs. Your health care provider may suggest some basic testing to know whether your murmur is an innocent murmur. If an innocent heart murmur is found, there is no need for further tests or treatment and no need to restrict activities or stop playing sports.  Abnormal murmurs. These types of murmurs can occur in children and adults. In children, abnormal heart murmurs are typically caused from heart defects that are present at birth (congenital). In adults, abnormal murmurs are usually from heart valve problems caused by disease, infection, or aging. CAUSES  All heart murmurs are a result of an issue with your heart valves. Normally, these valves open to let blood flow through or out of your heart and then shut to keep it from flowing backward. If they do not work properly, you could have:  Regurgitation--When blood leaks back through the valve in the wrong direction.  Mitral valve prolapse--When the mitral valve of the heart has a loose flap and does not close tightly.  Stenosis--When  the valve does not open enough and blocks blood flow. SIGNS AND SYMPTOMS  Innocent murmurs do not cause symptoms, and many people with abnormal murmurs may or may not have symptoms. If symptoms do develop, they may include:  Shortness of breath.  Blue coloring of the skin, especially on the fingertips.  Chest pain.  Palpitations, or feeling a fluttering or skipped heartbeat.  Fainting.  Persistent cough.  Getting tired much faster than expected. DIAGNOSIS  A heart murmur might be heard during a sports physical or during any type of examination. When a murmur is heard, it may suggest a possible problem. When this happens, your health care provider may ask you to see a heart specialist (cardiologist). You may also be asked to have one or more heart tests. In these cases, testing may vary depending on what your health care provider heard. Tests for a heart murmur may include:  Electrocardiogram.  Echocardiogram.  MRI. For children and adults who have an abnormal heart murmur and want to play sports, it is important to complete testing, review test results, and receive recommendations from your health care provider. If heart disease is present, it may not be safe to play. TREATMENT  Innocent murmurs require no treatment or   activity restriction. If an abnormal murmur represents a problem with the heart, treatment will depend on the exact nature of the problem. In these cases, medicine or surgery may be needed to treat the problem. HOME CARE INSTRUCTIONS If you want to participate in sports or other types of strenuous physical activity, it is important to discuss this first with your health care provider. If the murmur represents a problem with the heart and you choose to participate in sports, there is a small chance that a serious problem (including sudden death) could result.  SEEK MEDICAL CARE IF:   You feel that your symptoms are slowly worsening.  You develop any new symptoms that  cause concern.  You feel that you are having side effects from any medicines prescribed. SEEK IMMEDIATE MEDICAL CARE IF:   You develop chest pain.  You have shortness of breath.  You notice that your heart beats irregularly often enough to cause you to worry.  You have fainting spells.  Your symptoms suddenly get worse. Document Released: 04/26/2004 Document Revised: 03/24/2013 Document Reviewed: 11/24/2012 Exit[employer] Patient Information 2015 [employer]. This information is not intended to replace advice given to you by your health care provider. Make sure you discuss any questions you have with your health care provider.  

## 2014-06-05 NOTE — Telephone Encounter (Signed)
Pt notified and rx faxed 

## 2014-06-05 NOTE — ED Notes (Signed)
Pt reports onset this am of left side chest pain that radiates into her back. Described as tightness, denies sob or n/v.

## 2014-06-05 NOTE — ED Provider Notes (Addendum)
CSN: [id number]     Arrival date & time 06/05/14  1033 History   First MD Initiated Contact with Patient 06/05/14 1046     Chief Complaint  Patient presents with  . Chest Pain      HPI  Patient presents evaluation of "chest pain". States she awakened early this morning and had pain in her back. He was to the right of her mid back near her right scapula. Has some pain in her right arm as well. Then states that she developed some pain in the left side of her neck. This is all been intermittent and only a few minutes at a time. Is not short of breath. Pain is not pleuretic.  She is a nonsmoker. No history of hypertension diabetes or hypercholesterolemia. He does get occasional heartburn symptoms but takes Protonix and Carafate for this. This does not feel similar. Has not had a cough or shortness of breath or recent illness.  No history of heart disease or cardiac risk factors no family history of heart disease or PE. No recent prolonged immobilization cast splints fractures surgeries malignancies or leg swelling.  History of atrial/ventricular septal defect treated with patch repair at age 62. No known murmur to her report.  No exertional pain or symptoms recently.  Past Medical History  Diagnosis Date  . Other malaise and fatigue   . Routine general medical examination at a health care facility   . Shoulder pain   . Dysmenorrhea   . Left knee pain   . Dysuria   . Hematuria   . Anxiety   . Encounter for long-term (current) use of other medications   . Vitamin D deficiency   . Pure hyperglyceridemia   . Vitamin D deficiency   . Headache   . Migraine   . Depression   . Anemia   . High cholesterol    Past Surgical History  Procedure Laterality Date  . C seaction    . Vetricular hole    . Cesarean section    . Knee arthroscopy w/ acl reconstruction and hamstring graft Left    Family History  Problem Relation Age of Onset  . Depression Mother   . High blood pressure Mother     . Hyperlipidemia Mother   . Anxiety disorder Mother   . Hypertension Mother   . High blood pressure Father   . Hyperlipidemia Father   . Hypertension Father   . Crohn's disease Father   . Kidney disease Father    History  Substance Use Topics  . Smoking status: Former Games developer  . Smokeless tobacco: Never Used  . Alcohol Use: No   OB History    No data available     Review of Systems  Constitutional: Negative for fever, chills, diaphoresis, appetite change and fatigue.  HENT: Negative for mouth sores, sore throat and trouble swallowing.   Eyes: Negative for visual disturbance.  Respiratory: Negative for cough, chest tightness, shortness of breath and wheezing.   Cardiovascular: Positive for chest pain.  Gastrointestinal: Negative for nausea, vomiting, abdominal pain, diarrhea and abdominal distention.  Endocrine: Negative for polydipsia, polyphagia and polyuria.  Genitourinary: Negative for dysuria, frequency and hematuria.  Musculoskeletal: Positive for neck pain. Negative for gait problem.  Skin: Negative for color change, pallor and rash.  Neurological: Negative for dizziness, syncope, light-headedness and headaches.  Hematological: Does not bruise/bleed easily.  Psychiatric/Behavioral: Negative for behavioral problems and confusion.      Allergies  Sulfa antibiotics  Home Medications  Prior to Admission medications   Medication Sig Start Date End Date Taking? Authorizing Provider  clindamycin (CLINDAGEL) 1 % gel Apply topically 2 (two) times daily. 05/17/14  Yes Peyton Najjaravid H Hopper, MD  clonazePAM (KLONOPIN) 0.5 MG tablet Take 1 tablet (0.5 mg total) by mouth 2 (two) times daily as needed for anxiety. 05/17/14  Yes Peyton Najjaravid H Hopper, MD  diclofenac sodium (VOLTAREN) 1 % GEL Apply 2 g topically 4 (four) times daily. 05/17/14  Yes Peyton Najjaravid H Hopper, MD  dicyclomine (BENTYL) 20 MG tablet Take 20 mg by mouth 4 (four) times daily as needed for spasms.   Yes Historical Provider, MD   ondansetron (ZOFRAN-ODT) 8 MG disintegrating tablet Take 1 tablet (8 mg total) by mouth every 8 (eight) hours as needed for nausea or vomiting. 05/17/14  Yes Peyton Najjaravid H Hopper, MD  pantoprazole (PROTONIX) 40 MG tablet Take 40 mg by mouth daily.   Yes Historical Provider, MD  promethazine (PHENERGAN) 25 MG tablet Take 25 mg by mouth every 6 (six) hours as needed for nausea or vomiting.   Yes Historical Provider, MD  rizatriptan (MAXALT-MLT) 10 MG disintegrating tablet Take 1 tablet (10 mg total) by mouth as needed for migraine. May repeat in 2 hours if needed 03/11/14  Yes Peyton Najjaravid H Hopper, MD  sucralfate (CARAFATE) 1 G tablet Take 1 g by mouth 4 (four) times daily -  with meals and at bedtime.   Yes Historical Provider, MD  tiZANidine (ZANAFLEX) 4 MG tablet Take 0.5 tablets (2 mg total) by mouth every 8 (eight) hours as needed for muscle spasms. 03/11/14  Yes Peyton Najjaravid H Hopper, MD  traMADol (ULTRAM) 50 MG tablet Take 1 tablet (50 mg total) by mouth 2 (two) times daily as needed. 06/05/14   Peyton Najjaravid H Hopper, MD   BP 130/56 mmHg  Pulse 72  Temp(Src) 98.2 F (36.8 C) (Oral)  Resp 18  SpO2 98%  LMP 06/02/2014 Physical Exam  Constitutional: She is oriented to person, place, and time. She appears well-developed and well-nourished. No distress.  HENT:  Head: Normocephalic.  Eyes: Conjunctivae are normal. Pupils are equal, round, and reactive to light. No scleral icterus.  Neck: Normal range of motion. Neck supple. No thyromegaly present.  Cardiovascular: Normal rate and regular rhythm.  Exam reveals no gallop and no friction rub.   No murmur heard. Pulmonary/Chest: Effort normal and breath sounds normal. No respiratory distress. She has no wheezes. She has no rales.    Abdominal: Soft. Bowel sounds are normal. She exhibits no distension. There is no tenderness. There is no rebound.  Musculoskeletal: Normal range of motion.  Neurological: She is alert and oriented to person, place, and time.  Skin: Skin is  warm and dry. No rash noted.  Psychiatric: She has a normal mood and affect. Her behavior is normal.    ED Course  Procedures (including critical care time) Labs Review Labs Reviewed  CBC WITH DIFFERENTIAL/PLATELET  BASIC METABOLIC PANEL  I-STAT TROPOININ, ED    Imaging Review Ct Angio Chest Aorta W/cm &/or Wo/cm  06/05/2014   CLINICAL DATA:  Left chest pain for 2 days. Extension into the neck and right scapula. Pediatric ventricular septal defect repair.  EXAM: CT ANGIOGRAPHY CHEST WITH CONTRAST  TECHNIQUE: Multidetector CT imaging of the chest was performed using the standard protocol during bolus administration of intravenous contrast. Multiplanar CT image reconstructions and MIPs were obtained to evaluate the vascular anatomy.  CONTRAST:  100mL OMNIPAQUE IOHEXOL 350 MG/ML SOLN  COMPARISON:  None.  FINDINGS: Mediastinum/Nodes: Normal appearance of the great vessels. Normal thoracic aorta, without dissection or aneurysm. Mild cardiomegaly, without pericardial effusion. No pulmonary embolism, on this nondedicated study. No mediastinal or definite hilar adenopathy, given limitations of unenhanced CT.  Lungs/Pleura: No pleural fluid. Mild motion degradation. Mosaic attenuation, favored to be related to low lung volumes and mild air trapping.  4 mm subpleural right lower lobe pulmonary nodule on image 83 of series 5.  Upper abdomen: Normal imaged portions of the liver, spleen, stomach, pancreas, gallbladder, biliary tract, adrenal glands, kidneys. Normal abdominal aortic caliber without dissection in its imaged portions. There is atherosclerosis in the infrarenal aorta which is age advanced.  Musculoskeletal: S-shaped thoracolumbar spine curvature.  Review of the MIP images confirms the above findings.  IMPRESSION: 1. No evidence of aortic dissection or aneurysm. Mild but age advanced infrarenal aortic atherosclerosis. 2. No other explanation for chest pain. 3. 4 mm right middle lobe lung nodule. If the  patient is at high risk for bronchogenic carcinoma, follow-up chest CT at 1 year is recommended. If the patient is at low risk, no follow-up is needed. This recommendation follows the consensus statement: "Guidelines for Management of Small Pulmonary Nodules Detected on CT Scans: A Statement from the Fleischner Society" as published in Radiology 2005; 237:395-400. Available online at: DietDisorder.cz. 4. Mosaic attenuation throughout the lungs, likely related to diminished volumes and air trapping. 5. Mild motion degradation.   Electronically Signed   By: Jeronimo Greaves M.D.   On: 06/05/2014 14:20     EKG Interpretation   Date/Time:  Saturday June 05 2014 10:34:50 EST Ventricular Rate:  63 PR Interval:  140 QRS Duration: 98 QT Interval:  428 QTC Calculation: 437 R Axis:   104 Text Interpretation:  Normal sinus rhythm Rightward axis Incomplete right  bundle branch block No old tracing to compare Confirmed by Fayrene Fearing  MD, Chena Chohan  [phone]) on 06/05/2014 10:47:07 AM      MDM   Final diagnoses:  Chest pain    PERC negative.  Heart score of 2.  EKG without acute changes. Troponin negative. CT angiogram of the aorta shows no sign of dissection. Does have a pulmonary nodule. Is a nonsmoker low risk for cancer, thus no follow-up required. This point I think she is appropriate for discharge home. Tylenol. Recheck additional symptoms. Regarding her new murmur I have given her referral to cardiology as an outpatient.    Rolland Porter, MD 06/05/14 1435  Rolland Porter, MD 06/05/14 [phone]

## 2014-06-07 ENCOUNTER — Telehealth (HOSPITAL_COMMUNITY): Payer: Self-pay

## 2014-06-11 ENCOUNTER — Ambulatory Visit (INDEPENDENT_AMBULATORY_CARE_PROVIDER_SITE_OTHER): Payer: 59 | Admitting: Cardiology

## 2014-06-11 ENCOUNTER — Encounter: Payer: Self-pay | Admitting: Cardiology

## 2014-06-11 VITALS — BP 110/62 | HR 66 | Ht 66.0 in | Wt 162.8 lb

## 2014-06-11 DIAGNOSIS — Q210 Ventricular septal defect: Secondary | ICD-10-CM

## 2014-06-11 DIAGNOSIS — I700 Atherosclerosis of aorta: Secondary | ICD-10-CM

## 2014-06-11 DIAGNOSIS — Q211 Atrial septal defect, unspecified: Secondary | ICD-10-CM

## 2014-06-11 DIAGNOSIS — R079 Chest pain, unspecified: Secondary | ICD-10-CM

## 2014-06-11 DIAGNOSIS — E781 Pure hyperglyceridemia: Secondary | ICD-10-CM

## 2014-06-11 HISTORY — DX: Atrial septal defect, unspecified: Q21.10

## 2014-06-11 HISTORY — DX: Ventricular septal defect: Q21.0

## 2014-06-11 HISTORY — DX: Atherosclerosis of aorta: I70.0

## 2014-06-11 HISTORY — DX: Atrial septal defect: Q21.1

## 2014-06-11 NOTE — Progress Notes (Addendum)
Cardiology Office Note   Date:  06/11/2014   ID:  Joanna Anderson, DOB 1966/03/25, MRN [id number]  PCP:  Quentin MullingHOOPER,JEFFREY C, MD  Cardiologist:   Quintella ReichertURNER,TRACI R, MD   No chief complaint on file.     History of Present Illness: Joanna Anderson is a 49 y.o. female with history of  ASD and 2 VSDs s/p patch repairs at age 89 [facility] who presents for evaluation of chest pain.  She recently was in the ER on 3/5 with complaints of chest pain. She apparently awakened in the am and had pain in her back on the right side and right arm.  She then developed pain on the left side of her neck and chest and was intermittent lasting a few minutes at a time for several hours.   In the ER her workup was normal and she was sent home.  She described it as a pressure.  There was no associated SOB but did have diaphoresis and nausea.   She denied any SOB and pain was not pleuritic.  She is a nonsmoker.  She does have a history of GERD and had PUD.  She has frequent nausea with her ulcer and takes Protonix. Chest CT angio showed no evidence of PE but a pulmonary nodule was noted.  EKG was normal other than an IRBBB.  She was told that she has a heart murmur in the ER.  Since the ER visit she has not had any further chest discomfort since the ER visit.      Past Medical History  Diagnosis Date  . Other malaise and fatigue   . Routine general medical examination at a health care facility   . Shoulder pain   . Dysmenorrhea   . Left knee pain   . Dysuria   . Hematuria   . Anxiety   . Encounter for long-term (current) use of other medications   . Vitamin D deficiency   . Pure hyperglyceridemia   . Vitamin D deficiency   . Headache   . Migraine   . Depression   . Anemia   . High cholesterol   . ASD (atrial septal defect) 06/11/2014    S/p patch repair  . VSD (ventricular septal defect), multiple 06/11/2014    S/p patch repair  . Atherosclerosis of abdominal aorta 06/11/2014    Age advanced  atherosclerosis of the infrarenal aorta    Past Surgical History  Procedure Laterality Date  . C seaction    . Vetricular hole    . Cesarean section    . Knee arthroscopy w/ acl reconstruction and hamstring graft Left      Current Outpatient Prescriptions  Medication Sig Dispense Refill  . clindamycin (CLINDAGEL) 1 % gel Apply topically 2 (two) times daily. 30 g 0  . clonazePAM (KLONOPIN) 0.5 MG tablet Take 1 tablet (0.5 mg total) by mouth 2 (two) times daily as needed for anxiety. 60 tablet 1  . diclofenac sodium (VOLTAREN) 1 % GEL Apply 2 g topically 4 (four) times daily. 100 g 3  . dicyclomine (BENTYL) 20 MG tablet Take 20 mg by mouth 4 (four) times daily as needed for spasms.    . ondansetron (ZOFRAN-ODT) 8 MG disintegrating tablet Take 1 tablet (8 mg total) by mouth every 8 (eight) hours as needed for nausea or vomiting. 20 tablet 2  . pantoprazole (PROTONIX) 40 MG tablet Take 40 mg by mouth daily.    . promethazine (PHENERGAN) 25 MG  tablet Take 25 mg by mouth every 6 (six) hours as needed for nausea or vomiting.    . rizatriptan (MAXALT-MLT) 10 MG disintegrating tablet Take 1 tablet (10 mg total) by mouth as needed for migraine. May repeat in 2 hours if needed 16 tablet 12  . sucralfate (CARAFATE) 1 G tablet Take 1 g by mouth 4 (four) times daily -  with meals and at bedtime.    Marland Kitchen tiZANidine (ZANAFLEX) 4 MG tablet Take 0.5 tablets (2 mg total) by mouth every 8 (eight) hours as needed for muscle spasms. 30 tablet 0  . traMADol (ULTRAM) 50 MG tablet Take 1 tablet (50 mg total) by mouth 2 (two) times daily as needed. 40 tablet 1   No current facility-administered medications for this visit.    Allergies:   Sulfa antibiotics    Social History:  The patient  reports that she has quit smoking. She has never used smokeless tobacco. She reports that she does not drink alcohol or use illicit drugs.   Family History:  The patient's family history includes Anxiety disorder in her mother;  Crohn's disease in her father; Depression in her mother; High blood pressure in her father and mother; Hyperlipidemia in her father and mother; Hypertension in her father and mother; Kidney disease in her father; Peripheral vascular disease in her father and paternal grandfather.    ROS:  Please see the history of present illness.   Otherwise, review of systems are positive for nausea and headaches.   All other systems are reviewed and negative.    PHYSICAL EXAM: VS:  BP 110/62 mmHg  Pulse 66  Ht  (1.676 m)  Wt 162 lb 12.8 oz (73.846 kg)  BMI 26.29 kg/m2  LMP 06/02/2014 , BMI Body mass index is 26.29 kg/(m^2). GEN: Well nourished, well developed, in no acute distress HEENT: normal Neck: no JVD, carotid bruits, or masses Cardiac: RRR; no murmurs, rubs, or gallops,no edema  Respiratory:  clear to auscultation bilaterally, normal work of breathing GI: soft, nontender, nondistended, + BS MS: no deformity or atrophy Skin: warm and dry, no rash Neuro:  Strength and sensation are intact Psych: euthymic mood, full affect   EKG:  EKG is not ordered today.    Recent Labs: 12/17/2013: ALT 19 05/17/2014: TSH 1.012 06/05/2014: BUN 9; Creatinine 0.63; Hemoglobin 13.9; Platelets 195; Potassium 4.0; Sodium 139    Lipid Panel    Component Value Date/Time   CHOL 207* 05/17/2014 1144   TRIG 141 05/17/2014 1144   HDL 46 05/17/2014 1144   CHOLHDL 4.5 05/17/2014 1144   VLDL 28 05/17/2014 1144   LDLCALC 133* 05/17/2014 1144      Wt Readings from Last 3 Encounters:  06/11/14 162 lb 12.8 oz (73.846 kg)  05/17/14 162 lb (73.483 kg)  03/11/14 164 lb 3.2 oz (74.481 kg)      Other studies Reviewed: Additional studies/ records that were reviewed today include: ER records and EKG and labs. Review of the above records demonstrates: normal labs, NSR with IRBBB with nonspecific T wave changes    ASSESSMENT AND PLAN:  1.  Chest pain - atypical in nature and may be GI in etiology given her  history of PUD and GERD.  Her EKG is nonischemic.  I will get an ETT to assess.   2.  Age advanced infra renal atherosclerosis by CT scan.  I have recommended that she follow a strict diet and exercise once we know that her ETT is ok and  recheck lipids in 6 months.  She needs aggressive risk factor modification.  She has a strong family history of PVF on her Dad's side of the family. 3.  History ASD and 2 VSDs s/p patch repair at age 26. I will get a 2D echo to assess.     Current medicines are reviewed at length with the patient today.  The patient does not have concerns regarding medicines.  The following changes have been made:  no change  Labs/ tests ordered today include: 2D echo, ETT   Orders Placed This Encounter  Procedures  . Hepatic function panel  . Lipid Profile  . EKG 12-Lead  . Exercise tolerance test  . 2D Echocardiogram without contrast     Disposition:   FU with me in 6 months   Signed, Quintella Reichert, MD  06/11/2014 4:33 PM    [facility] [address] [place], [place], [place]  [address] Phone: [phone]; Fax: [phone]

## 2014-06-11 NOTE — Patient Instructions (Addendum)
Your physician recommends that you continue on your current medications as directed. Please refer to the Current Medication list given to you today.   Your physician wants you to follow-up in: 6 month Dr. Mayford Knifeurner.  You will receive a reminder letter in the mail two months in advance. If you don't receive a letter, please call our office to schedule the follow-up appointment @ [phone]    Your physician recommends that you return for lab work 6 months/ lft/lipid   Your physician has requested that you have an echocardiogram. DX : Ventricular Septal Defect. Echocardiography is a painless test that uses sound waves to create images of your heart. It provides your doctor with information about the size and shape of your heart and how well your heart's chambers and valves are working. This procedure takes approximately one hour. There are no restrictions for this procedure.   Your physician has requested that you have an exercise tolerance test.DX: CHEST PAIN For further information please visit [email or web address]. Please also follow instruction sheet, as given.

## 2014-06-14 ENCOUNTER — Ambulatory Visit (HOSPITAL_COMMUNITY): Payer: 59 | Attending: Cardiology | Admitting: Radiology

## 2014-06-14 DIAGNOSIS — Q211 Atrial septal defect, unspecified: Secondary | ICD-10-CM

## 2014-06-14 DIAGNOSIS — R079 Chest pain, unspecified: Secondary | ICD-10-CM | POA: Diagnosis not present

## 2014-06-14 DIAGNOSIS — E781 Pure hyperglyceridemia: Secondary | ICD-10-CM | POA: Insufficient documentation

## 2014-06-14 DIAGNOSIS — Q210 Ventricular septal defect: Secondary | ICD-10-CM | POA: Diagnosis not present

## 2014-06-14 DIAGNOSIS — I700 Atherosclerosis of aorta: Secondary | ICD-10-CM | POA: Diagnosis not present

## 2014-06-14 NOTE — Progress Notes (Signed)
Echocardiogram performed.  

## 2014-06-17 ENCOUNTER — Ambulatory Visit (HOSPITAL_COMMUNITY)
Admission: RE | Admit: 2014-06-17 | Discharge: 2014-06-17 | Disposition: A | Discharge status: Home / Self Care | Payer: 59 | Source: Ambulatory Visit | Attending: Cardiology | Admitting: Cardiology

## 2014-06-17 DIAGNOSIS — R079 Chest pain, unspecified: Secondary | ICD-10-CM

## 2014-06-17 DIAGNOSIS — Q211 Atrial septal defect, unspecified: Secondary | ICD-10-CM

## 2014-06-17 DIAGNOSIS — R0789 Other chest pain: Secondary | ICD-10-CM | POA: Diagnosis not present

## 2014-06-17 DIAGNOSIS — E781 Pure hyperglyceridemia: Secondary | ICD-10-CM

## 2014-06-17 DIAGNOSIS — I700 Atherosclerosis of aorta: Secondary | ICD-10-CM

## 2014-06-17 DIAGNOSIS — Q210 Ventricular septal defect: Secondary | ICD-10-CM

## 2014-06-23 ENCOUNTER — Ambulatory Visit: Payer: 59 | Admitting: Internal Medicine

## 2014-06-25 ENCOUNTER — Encounter (HOSPITAL_COMMUNITY): Payer: 59

## 2014-07-14 ENCOUNTER — Ambulatory Visit: Payer: 59 | Admitting: Cardiology

## 2014-07-19 ENCOUNTER — Ambulatory Visit (INDEPENDENT_AMBULATORY_CARE_PROVIDER_SITE_OTHER): Payer: 59 | Admitting: Internal Medicine

## 2014-07-19 VITALS — BP 122/84 | HR 73 | Temp 98.3°F | Resp 16 | Ht 67.0 in | Wt 160.0 lb

## 2014-07-19 DIAGNOSIS — IMO0001 Reserved for inherently not codable concepts without codable children: Secondary | ICD-10-CM

## 2014-07-19 DIAGNOSIS — F411 Generalized anxiety disorder: Secondary | ICD-10-CM

## 2014-07-19 DIAGNOSIS — R300 Dysuria: Secondary | ICD-10-CM

## 2014-07-19 DIAGNOSIS — R350 Frequency of micturition: Secondary | ICD-10-CM

## 2014-07-19 LAB — POCT UA - MICROSCOPIC ONLY
BACTERIA, U MICROSCOPIC: NEGATIVE
CASTS, UR, LPF, POC: NEGATIVE
Crystals, Ur, HPF, POC: NEGATIVE
Mucus, UA: NEGATIVE
YEAST UA: NEGATIVE

## 2014-07-19 LAB — POCT URINALYSIS DIPSTICK
Bilirubin, UA: NEGATIVE
Glucose, UA: NEGATIVE
Ketones, UA: NEGATIVE
Leukocytes, UA: NEGATIVE
NITRITE UA: NEGATIVE
PH UA: 6
Protein, UA: NEGATIVE
Urobilinogen, UA: 0.2

## 2014-07-19 LAB — POCT URINE PREGNANCY: Preg Test, Ur: NEGATIVE

## 2014-07-19 MED ORDER — CLONAZEPAM 0.5 MG PO TABS: 0.5000 mg | ORAL_TABLET | Freq: Two times a day (BID) | ORAL | Status: DC | PRN

## 2014-07-19 MED ORDER — CIPROFLOXACIN HCL 250 MG PO TABS: 250.0000 mg | ORAL_TABLET | Freq: Two times a day (BID) | ORAL | Status: DC

## 2014-07-19 NOTE — Progress Notes (Signed)
Subjective:  This chart was scribed for Tonye Pearsonobert P Doolittle, MD by Charline BillsEssence Howell, ED Scribe. The patient was seen in room 10. Patient's care was started at 9:57 AM.   Patient ID: Joanna Buffyeborah S Tipping, female    DOB: [birth year], 49 y.o.   MRN: [id number]  Chief Complaint  Patient presents with  . Dysuria    x 2 weeks   HPI HPI Comments: Joanna BuffyDeborah S Anderson is a 49 y.o. female, with a h/o ASD, VSD, hyperglyceridemia, high cholesterol, anemia, anxiety, depression, who presents to the [facility] complaining of persistent dysuria for the past 2 weeks. Pt states that she went to the beach 2.5 weeks ago in which she went swimming and got in the hot tub several times. She noticed intermittent itching and dysuria after returning from the beach. She describes dysuria as a burning sensation towards the end of her stream. Pt reports associated urinary frequency, urinary retention, nausea, lower abdominal pressure, mild back pain, chills, night sweats. She denies rash, vaginal discharge, urinary urgency, loss of appetite, vomiting, constipation, genitalia swelling. Pt also denies sexual intercourse in the past 3 weeks; she does not use contraception. Her LNMP was on 06/28/14; she uses tampons and pads without irritation. Pt reports similar symptoms that she has seen an urologist for in the past. She has been treating with Lotrimin spray, switched from Dial to [employer] soap, drinking plenty fluids and Tylenol with minimal relief.   She also would like to refill her Klonopin which issues for her generalized anxiety disorder. She mainly has symptoms at bedtime with her mind racing. This is been a lifelong problem and she probably inherited this from the mother who needs daily Xanax and a father who was an alcoholic and had an anxiety disorder. She has been in therapy and has had psychiatry visits in the past although it was never thought that she needed another medicine that a benzodiazepine. Married happily  to a Education officer, environmentalpastor with 4 kids ages 8-22. 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 Also administration was not good. No history of depression other than briefly postpartum one time. Happy in general but with frequent issues feeling stressed out.  Past Medical History  Diagnosis Date  . Other malaise and fatigue   . Routine general medical examination at a health care facility   . Shoulder pain   . Dysmenorrhea   . Left knee pain   . Dysuria   . Hematuria   . Anxiety   . Encounter for long-term (current) use of other medications   . Vitamin D deficiency   . Pure hyperglyceridemia   . Vitamin D deficiency   . Headache   . Migraine   . Depression   . Anemia   . High cholesterol   . ASD (atrial septal defect) 06/11/2014    S/p patch repair  . VSD (ventricular septal defect), multiple 06/11/2014    S/p patch repair  . Atherosclerosis of abdominal aorta 06/11/2014    Age advanced atherosclerosis of the infrarenal aorta   Current Outpatient Prescriptions on File Prior to Visit  Medication Sig Dispense Refill  . clindamycin (CLINDAGEL) 1 % gel Apply topically 2 (two) times daily. 30 g 0  . clonazePAM (KLONOPIN) 0.5 MG tablet Take 1 tablet (0.5 mg total) by mouth 2 (two) times daily as needed for anxiety. 60 tablet 1  . diclofenac sodium (  VOLTAREN) 1 % GEL Apply 2 g topically 4 (four) times daily. 100 g 3  . pantoprazole (PROTONIX) 40 MG tablet Take 40 mg by mouth daily.    . rizatriptan (MAXALT-MLT) 10 MG disintegrating tablet Take 1 tablet (10 mg total) by mouth as needed for migraine. May repeat in 2 hours if needed 16 tablet 12  . tiZANidine (ZANAFLEX) 4 MG tablet Take 0.5 tablets (2 mg total) by mouth every 8 (eight) hours as needed for muscle spasms. 30 tablet 0  . traMADol (ULTRAM) 50 MG tablet Take 1 tablet (50 mg total) by mouth 2 (two) times daily as needed. 40 tablet 1  . dicyclomine (BENTYL) 20  MG tablet Take 20 mg by mouth 4 (four) times daily as needed for spasms.    . ondansetron (ZOFRAN-ODT) 8 MG disintegrating tablet Take 1 tablet (8 mg total) by mouth every 8 (eight) hours as needed for nausea or vomiting. (Patient not taking: Reported on 07/19/2014) 20 tablet 2  . promethazine (PHENERGAN) 25 MG tablet Take 25 mg by mouth every 6 (six) hours as needed for nausea or vomiting.    . sucralfate (CARAFATE) 1 G tablet Take 1 g by mouth 4 (four) times daily -  with meals and at bedtime.     No current facility-administered medications on file prior to visit.   Allergies  Allergen Reactions  . Sulfa Antibiotics Hives    Review of Systems  Constitutional: Positive for chills and diaphoresis. Negative for appetite change.  Gastrointestinal: Positive for nausea and abdominal pain. Negative for vomiting and constipation.  Genitourinary: Positive for dysuria and frequency. Negative for urgency and vaginal discharge.  Musculoskeletal: Positive for back pain.  Skin: Negative for rash.   BP 122/84 mmHg  Pulse 73  Temp(Src) 98.3 F (36.8 C) (Oral)  Resp 16  Ht  (1.702 m)  Wt 160 lb (72.576 kg)  BMI 25.05 kg/m2  SpO2 97%  LMP 06/28/2014    Objective:   Physical Exam  Constitutional: She is oriented to person, place, and time. She appears well-developed and well-nourished. No distress.  HENT:  Head: Normocephalic and atraumatic.  Eyes: Conjunctivae and EOM are normal.  Neck: Neck supple. No tracheal deviation present.  Cardiovascular: Normal rate.   Pulmonary/Chest: Effort normal. No respiratory distress.  Abdominal: There is tenderness (mild; to palpation) in the suprapubic area. There is no CVA tenderness (to percussion).  Musculoskeletal: Normal range of motion.  Neurological: She is alert and oriented to person, place, and time.  Skin: Skin is warm and dry.  Psychiatric: She has a normal mood and affect. Her behavior is normal.  Nursing note and vitals  reviewed.  Results for orders placed or performed in visit on 07/19/14  POCT UA - Microscopic Only  Result Value Ref Range   WBC, Ur, HPF, POC 0-2    RBC, urine, microscopic 0-2    Bacteria, U Microscopic neg    Mucus, UA neg    Epithelial cells, urine per micros 0-5    Crystals, Ur, HPF, POC neg    Casts, Ur, LPF, POC neg    Yeast, UA neg   POCT urinalysis dipstick  Result Value Ref Range   Color, UA light yellow    Clarity, UA clear    Glucose, UA neg    Bilirubin, UA neg    Ketones, UA neg    Spec Grav, UA <=1.005    Blood, UA tr-intact    pH, UA 6.0    Protein,  UA neg    Urobilinogen, UA 0.2    Nitrite, UA neg    Leukocytes, UA Negative   POCT urine pregnancy  Result Value Ref Range   Preg Test, Ur Negative        Assessment & Plan:   I have completed the patient encounter in its entirety as documented by the scribe, with editing by me where necessary. Robert P. Merla Riches, M.D.  Dysuria - Frequency with suprapubic pressure   Plan:  Urine culture--- start Cipro----close follow-up   Generalized anxiety disorder- We discussed cognitive behavioral therapy as a way of solving or at least discovering the issues that prompted her symptoms the most Okay for refill of Klonopin use mainly for sleeping

## 2014-07-20 LAB — URINE CULTURE
COLONY COUNT: NO GROWTH
Organism ID, Bacteria: NO GROWTH

## 2014-07-23 ENCOUNTER — Telehealth: Payer: Self-pay

## 2014-07-23 NOTE — Telephone Encounter (Signed)
With negative culture and no response to antibiotics we don't have an etiology that we can depend on here and I think we need to do a follow-up exam with more testing before deciding what else to do

## 2014-07-23 NOTE — Telephone Encounter (Signed)
Pt called about urine culture results. Says she is still very symptomatic, dysuria, frequency, chills. Wants to know what the next step would be and if she needs to continue the abx. Please advise. Thanks

## 2014-07-29 NOTE — Telephone Encounter (Signed)
See labs 

## 2014-08-03 ENCOUNTER — Ambulatory Visit (INDEPENDENT_AMBULATORY_CARE_PROVIDER_SITE_OTHER): Payer: 59 | Admitting: Internal Medicine

## 2014-08-03 VITALS — BP 132/76 | HR 86 | Temp 97.9°F | Resp 16 | Ht 65.0 in | Wt 156.1 lb

## 2014-08-03 DIAGNOSIS — R350 Frequency of micturition: Secondary | ICD-10-CM | POA: Diagnosis not present

## 2014-08-03 DIAGNOSIS — Z124 Encounter for screening for malignant neoplasm of cervix: Secondary | ICD-10-CM

## 2014-08-03 DIAGNOSIS — R300 Dysuria: Secondary | ICD-10-CM

## 2014-08-03 DIAGNOSIS — Z113 Encounter for screening for infections with a predominantly sexual mode of transmission: Secondary | ICD-10-CM | POA: Diagnosis not present

## 2014-08-03 LAB — POCT URINALYSIS DIPSTICK
Bilirubin, UA: NEGATIVE
Glucose, UA: NEGATIVE
KETONES UA: 15
Leukocytes, UA: NEGATIVE
Nitrite, UA: NEGATIVE
PH UA: 7
Protein, UA: NEGATIVE
Spec Grav, UA: 1.02
Urobilinogen, UA: 0.2

## 2014-08-03 LAB — POCT UA - MICROSCOPIC ONLY
CASTS, UR, LPF, POC: NEGATIVE
Crystals, Ur, HPF, POC: NEGATIVE
MUCUS UA: NEGATIVE
WBC, UR, HPF, POC: NEGATIVE
YEAST UA: NEGATIVE

## 2014-08-03 LAB — POCT WET PREP WITH KOH
Clue Cells Wet Prep HPF POC: NEGATIVE
KOH Prep POC: NEGATIVE
TRICHOMONAS UA: NEGATIVE
Yeast Wet Prep HPF POC: NEGATIVE

## 2014-08-03 NOTE — Patient Instructions (Signed)
I will call you with the results of your lab tests. If all negative, well refer you to urology for further evaluation. Drink plenty of water in the meantime. AZO could possible help your symptoms.

## 2014-08-03 NOTE — Progress Notes (Signed)
Subjective:    Patient ID: Joanna Anderson, female    DOB: 1965-06-09, 49 y.o.   MRN: [id number]  HPI  This is a 49 year old female who is presenting for follow up of UTI symptoms. She was seen two weeks ago - UA was negative but she was started on cipro d/t symptoms. Culture produced no growth. She was instructed to stop the cipro after 7 days d/t no growth on culture. She states her symptoms went away completely while on the cipro but came back shortly after stopping. She continues to have dysuria, frequency and chills. Having some mild lower abdominal pressure. No vaginal discharge, back pain, N/V, hematuria. LMP 07/26/14. She is sexually active with her husband. She states she is worried that he has been "unfaithful" by the "things he has said and done lately". She reports he has more free-time than her and that worries her. She reports they split up 2 years ago and they both had other sexual partners at that time. Since being back together, she has only had her husband as her sexual partner. She has not had STD testing since that time. She is very worried about the STD testing, she has agreed to do G/C testing but refused all others. She reports it has been over 5 years since she had a pap. She has had one abnormal pap 18 years ago but no abnormal since. She reports 10 years ago she saw a urologist for symptoms similar to now. She reports they had to dilate her bladder neck d/t urinary retention and incomplete voidance. She has not been back to a urologist since.  Review of Systems  Constitutional: Positive for chills. Negative for fever.  Gastrointestinal: Positive for abdominal pain. Negative for nausea and vomiting.  Genitourinary: Positive for dysuria and frequency. Negative for hematuria, vaginal bleeding and vaginal discharge.  Musculoskeletal: Negative for back pain.  Skin: Negative for rash.  Hematological: Negative for adenopathy.   Patient Active Problem List   Diagnosis Date Noted    . Chest pain 06/11/2014  . ASD (atrial septal defect) 06/11/2014  . VSD (ventricular septal defect), multiple 06/11/2014  . Atherosclerosis of abdominal aorta 06/11/2014  . Other malaise and fatigue   . Shoulder pain   . Dysmenorrhea   . Dysuria   . Hematuria   . Anxiety   . Encounter for long-term (current) use of other medications   . Vitamin D deficiency   . Pure hyperglyceridemia   . Migraine   . Depression    Prior to Admission medications   Medication Sig Start Date End Date Taking? Authorizing Provider  baclofen (LIORESAL) 10 MG tablet Take 10 mg by mouth 2 (two) times daily. Limit use to 1 to 2 days per week   Yes Historical Provider, MD  clindamycin (CLINDAGEL) 1 % gel Apply topically 2 (two) times daily. 05/17/14  Yes Peyton Najjaravid H Hopper, MD  clonazePAM (KLONOPIN) 0.5 MG tablet Take 1 tablet (0.5 mg total) by mouth 2 (two) times daily as needed for anxiety. 07/19/14  Yes Tonye Pearsonobert P Doolittle, MD  diclofenac sodium (VOLTAREN) 1 % GEL Apply 2 g topically 4 (four) times daily. 05/17/14  Yes Peyton Najjaravid H Hopper, MD  dicyclomine (BENTYL) 20 MG tablet Take 20 mg by mouth 4 (four) times daily as needed for spasms.   Yes Historical Provider, MD  imipramine (TOFRANIL) 25 MG tablet Take 50 mg by mouth at bedtime.   Yes Historical Provider, MD  ondansetron (ZOFRAN-ODT) 8 MG disintegrating tablet  Take 1 tablet (8 mg total) by mouth every 8 (eight) hours as needed for nausea or vomiting. 05/17/14  Yes Peyton Najjar, MD  pantoprazole (PROTONIX) 40 MG tablet Take 40 mg by mouth daily.   Yes Historical Provider, MD  promethazine (PHENERGAN) 25 MG tablet Take 25 mg by mouth every 6 (six) hours as needed for nausea or vomiting.   Yes Historical Provider, MD  sucralfate (CARAFATE) 1 G tablet Take 1 g by mouth 4 (four) times daily -  with meals and at bedtime.   Yes Historical Provider, MD  tiZANidine (ZANAFLEX) 4 MG tablet Take 0.5 tablets (2 mg total) by mouth every 8 (eight) hours as needed for muscle  spasms. 03/11/14  Yes Peyton Najjar, MD                        Allergies  Allergen Reactions  . Sulfa Antibiotics Hives   Patient's social and family history were reviewed.     Objective:   Physical Exam  Constitutional: She is oriented to person, place, and time. She appears well-developed and well-nourished. No distress.  HENT:  Head: Normocephalic and atraumatic.  Right Ear: Hearing normal.  Left Ear: Hearing normal.  Nose: Nose normal.  Eyes: Conjunctivae and lids are normal. Right eye exhibits no discharge. Left eye exhibits no discharge. No scleral icterus.  Cardiovascular: Normal rate, regular rhythm, intact distal pulses and normal pulses.   Pulmonary/Chest: Effort normal and breath sounds normal. No respiratory distress.  Abdominal: Soft. Normal appearance. There is no tenderness. There is no CVA tenderness.  Genitourinary: Vagina normal. There is no lesion on the right labia. There is no lesion on the left labia. Uterus is not tender. Cervix exhibits no motion tenderness, no discharge and no friability. Right adnexum displays no tenderness and no fullness. Left adnexum displays no tenderness and no fullness. No vaginal discharge found.  Cervix anteflexed, uterus retroverted and unable to be palpated.  Musculoskeletal: Normal range of motion.  Neurological: She is alert and oriented to person, place, and time.  Skin: Skin is warm, dry and intact. No lesion and no rash noted.  Psychiatric: She has a normal mood and affect. Her speech is normal and behavior is normal. Thought content normal.   BP 132/76 mmHg  Pulse 86  Temp(Src) 97.9 F (36.6 C) (Oral)  Resp 16  Ht  (1.651 m)  Wt 156 lb 2 oz (70.818 kg)  BMI 25.98 kg/m2  SpO2 97%  LMP 07/26/2014  Results for orders placed or performed in visit on 08/03/14  POCT UA - Microscopic Only  Result Value Ref Range   WBC, Ur, HPF, POC neg    RBC, urine, microscopic 0-1    Bacteria, U Microscopic trace    Mucus, UA  neg    Epithelial cells, urine per micros 0-4    Crystals, Ur, HPF, POC neg    Casts, Ur, LPF, POC neg    Yeast, UA neg   POCT urinalysis dipstick  Result Value Ref Range   Color, UA yellow    Clarity, UA clear    Glucose, UA neg    Bilirubin, UA neg    Ketones, UA 15    Spec Grav, UA 1.020    Blood, UA trace-lysed    pH, UA 7.0    Protein, UA neg    Urobilinogen, UA 0.2    Nitrite, UA neg    Leukocytes, UA Negative   POCT Wet  Prep with KOH  Result Value Ref Range   Trichomonas, UA Negative    Clue Cells Wet Prep HPF POC neg    Epithelial Wet Prep HPF POC 3-5    Yeast Wet Prep HPF POC neg    Bacteria Wet Prep HPF POC trace    RBC Wet Prep HPF POC 0-1    WBC Wet Prep HPF POC 0-1    KOH Prep POC Negative       Assessment & Plan:  1. Dysuria 2. Urinary frequency 3. Screen for STD UA again negative. Wet prep negative. G/C, CBC, CMP and urine culture pending. Advised in the meantime she can take AZO OTC for her symptoms. If all negative, will refer to urology. Possible that she is having a similar problem to 10 years ago when she was having incomplete voidance. Interstitial cystitis also a possibility. - CBC - POCT UA - Microscopic Only - POCT urinalysis dipstick - POCT Wet Prep with KOH - Comprehensive metabolic panel - Urine culture - GC/Chlamydia Probe Amp  4. Cervical cancer screening - Pap IG w/ reflex to HPV when ASC-U   [place]. Dyke Brackett, MHS [facility] I have participated in the care of this patient with the Advanced Practice Provider and agree with Diagnosis and Plan as documented. Robert P. Merla Riches, M.D.  08/05/2014

## 2014-08-04 ENCOUNTER — Telehealth: Payer: Self-pay

## 2014-08-04 LAB — COMPREHENSIVE METABOLIC PANEL
ALBUMIN: 4.4 g/dL (ref 3.5–5.2)
ALT: 14 U/L (ref 0–35)
AST: 16 U/L (ref 0–37)
Alkaline Phosphatase: 58 U/L (ref 39–117)
BUN: 12 mg/dL (ref 6–23)
CALCIUM: 9.3 mg/dL (ref 8.4–10.5)
CHLORIDE: 100 meq/L (ref 96–112)
CO2: 30 mEq/L (ref 19–32)
Creat: 0.53 mg/dL (ref 0.50–1.10)
Glucose, Bld: 88 mg/dL (ref 70–99)
Potassium: 4.5 mEq/L (ref 3.5–5.3)
SODIUM: 138 meq/L (ref 135–145)
TOTAL PROTEIN: 7.3 g/dL (ref 6.0–8.3)
Total Bilirubin: 0.8 mg/dL (ref 0.2–1.2)

## 2014-08-04 LAB — CBC
HCT: 39 % (ref 36.0–46.0)
Hemoglobin: 13.5 g/dL (ref 12.0–15.0)
MCH: 29.9 pg (ref 26.0–34.0)
MCHC: 34.6 g/dL (ref 30.0–36.0)
MCV: 86.3 fL (ref 78.0–100.0)
MPV: 10.7 fL (ref 8.6–12.4)
Platelets: 218 10*3/uL (ref 150–400)
RBC: 4.52 MIL/uL (ref 3.87–5.11)
RDW: 13.1 % (ref 11.5–15.5)
WBC: 10.4 10*3/uL (ref 4.0–10.5)

## 2014-08-04 NOTE — Telephone Encounter (Signed)
Pt would like to know her lab results asap.  She doesn't care who looks at the results as long as she can find out.  Says she feels like she has a bladder infection. [phone]

## 2014-08-05 LAB — GC/CHLAMYDIA PROBE AMP
CT PROBE, AMP APTIMA: NEGATIVE
GC Probe RNA: NEGATIVE

## 2014-08-05 LAB — URINE CULTURE
COLONY COUNT: NO GROWTH
ORGANISM ID, BACTERIA: NO GROWTH

## 2014-08-05 NOTE — Telephone Encounter (Signed)
Gave pt. results

## 2014-08-05 NOTE — Telephone Encounter (Signed)
Pt calling on status of this message. Please advise at [phone]

## 2014-08-05 NOTE — Telephone Encounter (Signed)
Pt is anxious to get labs reviewed. Dr Merla Richesdoolittle won't be back until tomorrow. Can someone else review them?

## 2014-08-05 NOTE — Telephone Encounter (Signed)
GC/CT both negative. Kidney and liver tests are NORMAL. Pap test and Urine Culture remain in process.

## 2014-08-06 ENCOUNTER — Other Ambulatory Visit: Payer: Self-pay | Admitting: Physician Assistant

## 2014-08-06 ENCOUNTER — Telehealth: Payer: Self-pay

## 2014-08-06 DIAGNOSIS — R300 Dysuria: Secondary | ICD-10-CM

## 2014-08-06 LAB — PAP IG W/ RFLX HPV ASCU

## 2014-08-06 NOTE — Telephone Encounter (Signed)
Patient called in regards to labs. Please contact as soon as possible. She's concerned with all the pain she's having and she's also having chills now.

## 2014-08-10 LAB — HUMAN PAPILLOMAVIRUS, HIGH RISK: HPV DNA High Risk: NOT DETECTED

## 2014-09-10 ENCOUNTER — Encounter: Payer: Self-pay | Admitting: Cardiology

## 2014-09-20 ENCOUNTER — Telehealth: Payer: Self-pay | Admitting: Radiology

## 2014-09-20 NOTE — Telephone Encounter (Signed)
Patient called to report that she is feeling better now, does not need urology referral.  Saw gynecologist and diagnosed with endometritis.   Sick for 10 weeks; came to [place] twice.   Patient is disappointed that we did not catch this.

## 2014-09-29 ENCOUNTER — Other Ambulatory Visit: Payer: Self-pay | Admitting: Family Medicine

## 2014-09-30 ENCOUNTER — Telehealth: Payer: Self-pay

## 2014-09-30 NOTE — Telephone Encounter (Signed)
Patient wants to know why she can't get a refill for tramadol if she's been seen several time. Please call patient! [phone]

## 2014-09-30 NOTE — Telephone Encounter (Signed)
Left message for pt to call back.   Peyton Najjaravid H Hopper, MD at 06/04/2014 9:24 PM     Status: Signed       Expand All Collapse All   Please send in a prescription for her for Tramadol 50 mg One bid prn severe pain only #40 One refill.  She needs to come in after that for further refills. Recommended for only when needed for bad pain, should try to minimize use.

## 2014-09-30 NOTE — Telephone Encounter (Signed)
I cannot refill.  Needs re-visit and assessment.

## 2014-09-30 NOTE — Telephone Encounter (Signed)
Spoke with pt, advised message below. She states she does not take Ibuprofen because of her stomach ulcers. She states she cannot take Tylenol because it doesn't work. She states she does use this with caution and it has been over 4 months since she asked for a refill. She is limited to what she can take but if you have any other ideas, she is open to them. Please advise.

## 2014-10-02 ENCOUNTER — Ambulatory Visit (INDEPENDENT_AMBULATORY_CARE_PROVIDER_SITE_OTHER): Payer: 59 | Admitting: Emergency Medicine

## 2014-10-02 VITALS — BP 122/68 | HR 84 | Temp 98.6°F | Resp 17 | Ht 65.5 in | Wt 154.0 lb

## 2014-10-02 DIAGNOSIS — R102 Pelvic and perineal pain: Secondary | ICD-10-CM | POA: Diagnosis not present

## 2014-10-02 DIAGNOSIS — R6883 Chills (without fever): Secondary | ICD-10-CM | POA: Diagnosis not present

## 2014-10-02 DIAGNOSIS — R5381 Other malaise: Secondary | ICD-10-CM | POA: Diagnosis not present

## 2014-10-02 DIAGNOSIS — R5383 Other fatigue: Secondary | ICD-10-CM | POA: Diagnosis not present

## 2014-10-02 LAB — POCT CBC
GRANULOCYTE PERCENT: 63.6 % (ref 37–80)
HCT, POC: 39.1 % (ref 37.7–47.9)
Hemoglobin: 12.9 g/dL (ref 12.2–16.2)
Lymph, poc: 2.3 (ref 0.6–3.4)
MCH, POC: 29 pg (ref 27–31.2)
MCHC: 33 g/dL (ref 31.8–35.4)
MCV: 87.9 fL (ref 80–97)
MID (cbc): 0.5 (ref 0–0.9)
MPV: 8.1 fL (ref 0–99.8)
POC Granulocyte: 4.9 (ref 2–6.9)
POC LYMPH PERCENT: 30.1 %L (ref 10–50)
POC MID %: 6.3 %M (ref 0–12)
Platelet Count, POC: 206 10*3/uL (ref 142–424)
RBC: 4.44 M/uL (ref 4.04–5.48)
RDW, POC: 12.8 %
WBC: 7.7 10*3/uL (ref 4.6–10.2)

## 2014-10-02 LAB — POCT UA - MICROSCOPIC ONLY
CASTS, UR, LPF, POC: NEGATIVE
Crystals, Ur, HPF, POC: NEGATIVE
YEAST UA: NEGATIVE

## 2014-10-02 LAB — POCT URINALYSIS DIPSTICK
Bilirubin, UA: NEGATIVE
Blood, UA: NEGATIVE
Glucose, UA: NEGATIVE
Ketones, UA: NEGATIVE
LEUKOCYTES UA: NEGATIVE
NITRITE UA: NEGATIVE
PH UA: 6
Protein, UA: NEGATIVE
Spec Grav, UA: 1.025
UROBILINOGEN UA: 0.2

## 2014-10-02 MED ORDER — MELOXICAM 7.5 MG PO TABS: 7.5000 mg | ORAL_TABLET | Freq: Every day | ORAL | Status: DC

## 2014-10-02 MED ORDER — IMIPRAMINE HCL 25 MG PO TABS: 50.0000 mg | ORAL_TABLET | Freq: Every day | ORAL | Status: DC

## 2014-10-02 NOTE — Progress Notes (Signed)
MRN: [id number] DOB: 05/09/1965  Subjective:   Joanna Anderson is a 49 y.o. female presenting for chief complaint of Chills; Abdominal Pain; and Excessive Sweating  Reports 3 month history of intermittent chills, lower abdominal pain, pelvic pain, mood swings, malaise. Patient has been seen here, [facility], by multiple providers, has been treated with Cipro for possible UTI. Her gynecologist also cleared her and prescribed doxycycline for an "uterus infection". Her gynecologist also told patient she had 2 cysts on left and 1 on right ovary, pelvic pain might be due to broad ligament. Patient was concerned that she may be undergoing hormonal changes/menopause so her gynecologist started her on Nuva Ring. She has also seen her cardiologist who cleared her. Her primary concern today is that she continues to have intermittent chills and malaise. She denies fevers, weight loss, decreased appetite, bloody stool, constipation, dysuria, hematuria, urinary frequency, urgency, abdominal pain. Denies any other aggravating or relieving factors, no other questions or concerns.  Khloie has a current medication list which includes the following prescription(s): baclofen, clindamycin, clonazepam, diclofenac sodium, imipramine, ondansetron, promethazine, tizanidine, and tramadol. She is allergic to sulfa antibiotics.  Raeana  has a past medical history of Other malaise and fatigue; Routine general medical examination at a health care facility; Shoulder pain; Dysmenorrhea; Left knee pain; Dysuria; Hematuria; Anxiety; Encounter for long-term (current) use of other medications; Vitamin D deficiency; Pure hyperglyceridemia; Vitamin D deficiency; Headache; Migraine; Depression; Anemia; High cholesterol; ASD (atrial septal defect) (06/11/2014); VSD (ventricular septal defect), multiple (06/11/2014); and Atherosclerosis of abdominal aorta (06/11/2014). Also  has past surgical history that includes c  seaction; vetricular hole; Cesarean section; and Knee arthroscopy w/ ACL reconstruction and hamstring graft (Left).  ROS As in subjective.  Objective:   Vitals: BP 122/68 mmHg  Pulse 84  Temp(Src) 98.6 F (37 C) (Oral)  Resp 17  Ht 5' 5.5" (1.664 m)  Wt 154 lb (69.854 kg)  BMI 25.23 kg/m2  SpO2 99%  LMP 09/20/2014  Physical Exam  Constitutional: She is oriented to person, place, and time. She appears well-developed and well-nourished.  HENT:  Mouth/Throat: Oropharynx is clear and moist.  Eyes: Conjunctivae are normal. Pupils are equal, round, and reactive to light. Right eye exhibits no discharge. Left eye exhibits no discharge. No scleral icterus.  Neck: Normal range of motion. Neck supple. No thyromegaly present.  Cardiovascular: Normal rate, regular rhythm and intact distal pulses.  Exam reveals no gallop and no friction rub.   No murmur heard. Pulmonary/Chest: No stridor. No respiratory distress. She has no wheezes. She has no rales.  Abdominal: Soft. Bowel sounds are normal. She exhibits no distension and no mass. There is tenderness (lower abdominal/pelvic).  Musculoskeletal: She exhibits no edema.  Lymphadenopathy:    She has no cervical adenopathy.  Neurological: She is alert and oriented to person, place, and time.  Skin: Skin is warm and dry. No rash noted. No erythema. No pallor.  Psychiatric: Her mood appears anxious (especially when discussing differential).   Results for orders placed or performed in visit on 10/02/14 (from the past 24 hour(s))  POCT CBC     Status: None   Collection Time: 10/02/14  3:53 PM  Result Value Ref Range   WBC 7.7 4.6 - 10.2 K/uL   Lymph, poc 2.3 0.6 - 3.4   POC LYMPH PERCENT 30.1 10 - 50 %L   MID (cbc) 0.5 0 - 0.9   POC MID % 6.3 0 - 12 %M  POC Granulocyte 4.9 2 - 6.9   Granulocyte percent 63.6 37 - 80 %G   RBC 4.44 4.04 - 5.48 M/uL   Hemoglobin 12.9 12.2 - 16.2 g/dL   HCT, POC 16.139.1 09.637.7 - 47.9 %   MCV 87.9 80 - 97 fL    MCH, POC 29.0 27 - 31.2 pg   MCHC 33.0 31.8 - 35.4 g/dL   RDW, POC 04.512.8 %   Platelet Count, POC 206 142 - 424 K/uL   MPV 8.1 0 - 99.8 fL  POCT UA - Microscopic Only     Status: None   Collection Time: 10/02/14  3:53 PM  Result Value Ref Range   WBC, Ur, HPF, POC 0-1    RBC, urine, microscopic 1-3    Bacteria, U Microscopic trace    Mucus, UA trace    Epithelial cells, urine per micros 0-2    Crystals, Ur, HPF, POC neg    Casts, Ur, LPF, POC neg    Yeast, UA neg   POCT urinalysis dipstick     Status: None   Collection Time: 10/02/14  3:53 PM  Result Value Ref Range   Color, UA yellow    Clarity, UA clear    Glucose, UA neg    Bilirubin, UA neg    Ketones, UA neg    Spec Grav, UA 1.025    Blood, UA neg    pH, UA 6.0    Protein, UA neg    Urobilinogen, UA 0.2    Nitrite, UA neg    Leukocytes, UA Negative Negative   Assessment and Plan :   1. Chills (without fever) 2. Pelvic pain in female 3. Malaise and fatigue - Labs pending, unclear etiology, may have interstitial cystitis, will start referral to Urologist. Also, upon further questioning, patient admitted that her symptoms were closely associated with starting Nuva Ring. Discussed possibility of peri-menopausal symptoms, follow up with gynecologist to discuss this possibility. This does not appear to be infectious process. Will follow up with patient regarding results.  Wallis BambergMario Ry Moody, PA-C [facility] [phone] 10/02/2014 2:31 PM

## 2014-10-03 LAB — C-REACTIVE PROTEIN: CRP: 0.6 mg/dL — ABNORMAL HIGH (ref <0.60)

## 2014-10-03 LAB — SEDIMENTATION RATE: Sed Rate: 18 mm/hr (ref 0–20)

## 2014-10-07 NOTE — Telephone Encounter (Signed)
I have not seen her since February, and do not recall details on her enough to make further suggestions. She needs to come back in and be reexamined and talk with one of us about her options.

## 2014-10-08 NOTE — Telephone Encounter (Signed)
Pt came in after this message on 10/02/2014.

## 2014-12-04 ENCOUNTER — Encounter: Payer: Self-pay | Admitting: Family Medicine

## 2014-12-04 ENCOUNTER — Ambulatory Visit (INDEPENDENT_AMBULATORY_CARE_PROVIDER_SITE_OTHER): Payer: 59 | Admitting: Family Medicine

## 2014-12-04 VITALS — BP 128/84 | HR 81 | Temp 98.7°F | Resp 16 | Ht 65.5 in | Wt 155.0 lb

## 2014-12-04 DIAGNOSIS — G8929 Other chronic pain: Secondary | ICD-10-CM

## 2014-12-04 DIAGNOSIS — G43901 Migraine, unspecified, not intractable, with status migrainosus: Secondary | ICD-10-CM | POA: Diagnosis not present

## 2014-12-04 DIAGNOSIS — M791 Myalgia: Secondary | ICD-10-CM | POA: Diagnosis not present

## 2014-12-04 DIAGNOSIS — F411 Generalized anxiety disorder: Secondary | ICD-10-CM | POA: Diagnosis not present

## 2014-12-04 DIAGNOSIS — M7918 Myalgia, other site: Secondary | ICD-10-CM

## 2014-12-04 MED ORDER — CLONAZEPAM 0.5 MG PO TABS: 0.5000 mg | ORAL_TABLET | Freq: Two times a day (BID) | ORAL | Status: DC | PRN

## 2014-12-04 MED ORDER — DULOXETINE HCL 30 MG PO CPEP: ORAL_CAPSULE | ORAL | Status: DC

## 2014-12-04 MED ORDER — BACLOFEN 10 MG PO TABS: 10.0000 mg | ORAL_TABLET | Freq: Two times a day (BID) | ORAL | Status: DC

## 2014-12-04 NOTE — Patient Instructions (Signed)
Takes Cymbalta 30 mg every morning, and after 1 week increase to twice daily  Try to increase exercise  Try to see me back in 3 weeks.  You may feel Cymbalta is making you little bit spacey initially, but give it a couple of weeks. People usually adapt well to it. You will not feel like you're gotten benefit immediately initially.

## 2014-12-04 NOTE — Progress Notes (Addendum)
Musculoskeletal pain Subjective:  Patient ID: Joanna Anderson, female    DOB: 1966-02-14  Age: 49 y.o. MRN: [id number]  Patient well-known to me who is stressed out and frustrated with chronic pain. Over the last 6 months, especially more recently. She has been hurting all over. Actually her pains go back to a couple of years ago when she had a left knee anterior cruciate ligament repair. She used to be active and running before then. She still stays busy and active, working and with family and church. However she just hurts all the time. Especially when she goes home and tries to relax she hurts terribly. It can change positions. Hurts in her shoulders and back and arms and thighs and calves. She always hurts in the left knee. She has been worked up with sedimentation rate which was normal and CPK which was high normal. She says it somewhere, probably in [facility], she had a low vitamin D level. She does not smoke or drink or use substances. She does take her various medications on a when necessary basis. She has a long history of migraine problems and is seen a migraine specialist in the past. She has been tried on imipramine and other medications for the headaches and aches. She's been to multiple doctors. She doesn't like the way she has felt on several medications. She gets feeling so bad she cries easily about this, and feels like people probably think she is going crazy.     Objective:   Healthy-appearing lady. She's got some tenderness of the general muscle groups. Major exam not done today.  Assessment & Plan:   Assessment:  Musculoskeletal pain disorder, chronic intermittent History of migraines Anxiety and depression, much of it situational from the pain.  Plan:  Try Cymbalta. Do a CPK, ANA, and vitamin D level.  Make referral to a rheumatologist for further assessment.  Plan to return after about 3 weeks of taking the Cymbalta.  Patient Instructions  Takes Cymbalta 30 mg  every morning, and after 1 week increase to twice daily  Try to increase exercise  Try to see me back in 3 weeks.  You may feel Cymbalta is making you little bit spacey initially, but give it a couple of weeks. People usually adapt well to it. You will not feel like you're gotten benefit immediately initially.       HOPPER,DAVID, MD 12/07/2014

## 2014-12-05 ENCOUNTER — Telehealth: Payer: Self-pay

## 2014-12-05 LAB — CK: Total CK: 71 U/L (ref 7–177)

## 2014-12-05 NOTE — Telephone Encounter (Signed)
Pt. Was seen by Dr. Alwyn Ren [phone] and was prescribed Cymbalta. Her insurance is not willing to pay for the Cymbalta because of the way the Rx is written; it currently says after a week of takin one  pill a day to take two 30 mg pills a day. However insurance is requiring that the dosage be written as either  a day or 60 mg a day; they will only pay ONE pill a day. Her pharmacy is giving her the first seven days pills, but she will need a new Rx written that meets her insurance company's standards. Pt is requesting that we right the Rx for one  pill a day.

## 2014-12-06 ENCOUNTER — Encounter: Payer: Self-pay | Admitting: Family Medicine

## 2014-12-06 MED ORDER — DULOXETINE HCL 60 MG PO CPEP: 60.0000 mg | ORAL_CAPSULE | Freq: Every day | ORAL | Status: DC

## 2014-12-06 NOTE — Telephone Encounter (Signed)
Sent for  Cymbalta daily. This should be one pill per day. Let me know if there are any issues.

## 2014-12-07 ENCOUNTER — Telehealth: Payer: Self-pay

## 2014-12-07 LAB — VITAMIN D 25 HYDROXY (VIT D DEFICIENCY, FRACTURES): Vit D, 25-Hydroxy: 50 ng/mL (ref 30–100)

## 2014-12-07 LAB — ANA: Anti Nuclear Antibody(ANA): NEGATIVE

## 2014-12-07 NOTE — Telephone Encounter (Signed)
See other note I sent.  ANA also normal

## 2014-12-07 NOTE — Telephone Encounter (Signed)
Pt called. Received letter about labs but only one lab was resulted. Let her know Vit D was normal and that we would give her a call when her ANA was back

## 2014-12-16 ENCOUNTER — Telehealth: Payer: Self-pay | Admitting: Family Medicine

## 2014-12-16 DIAGNOSIS — G43901 Migraine, unspecified, not intractable, with status migrainosus: Secondary | ICD-10-CM

## 2014-12-16 NOTE — Telephone Encounter (Signed)
Is this ok?

## 2014-12-16 NOTE — Telephone Encounter (Signed)
Patient wants to increase her Baclofen to 3 pills a day. She will need a refill by next week.  [phone]

## 2014-12-18 NOTE — Telephone Encounter (Signed)
Give one refill  Do not increase to 3 daily  If still having this much symptoms needs to be sent to headache clinic if she doesn't already have an appointment

## 2014-12-20 ENCOUNTER — Other Ambulatory Visit: Payer: Self-pay | Admitting: Family Medicine

## 2014-12-20 DIAGNOSIS — G43901 Migraine, unspecified, not intractable, with status migrainosus: Secondary | ICD-10-CM

## 2014-12-20 MED ORDER — BACLOFEN 10 MG PO TABS: 10.0000 mg | ORAL_TABLET | Freq: Three times a day (TID) | ORAL | Status: DC

## 2014-12-20 NOTE — Telephone Encounter (Signed)
Spoke with pt, she states she uses this for muscle spasms not headaches. She states she would like to be able to take this 2-3 times a day. She insists there is some confusion. Please advise.

## 2014-12-20 NOTE — Telephone Encounter (Signed)
I increased her to 3 times daily. Do not take it continuously. If he is continuing to need this on a regular basis she needs to come back to further discuss.  Prescription sent in.

## 2014-12-21 DIAGNOSIS — R51 Headache: Secondary | ICD-10-CM | POA: Diagnosis present

## 2014-12-21 DIAGNOSIS — Z791 Long term (current) use of non-steroidal anti-inflammatories (NSAID): Secondary | ICD-10-CM | POA: Insufficient documentation

## 2014-12-21 DIAGNOSIS — F419 Anxiety disorder, unspecified: Secondary | ICD-10-CM | POA: Diagnosis not present

## 2014-12-21 DIAGNOSIS — Z79899 Other long term (current) drug therapy: Secondary | ICD-10-CM | POA: Diagnosis not present

## 2014-12-21 DIAGNOSIS — G4489 Other headache syndrome: Secondary | ICD-10-CM | POA: Insufficient documentation

## 2014-12-21 DIAGNOSIS — Z862 Personal history of diseases of the blood and blood-forming organs and certain disorders involving the immune mechanism: Secondary | ICD-10-CM | POA: Diagnosis not present

## 2014-12-21 DIAGNOSIS — Z87891 Personal history of nicotine dependence: Secondary | ICD-10-CM | POA: Diagnosis not present

## 2014-12-21 DIAGNOSIS — F329 Major depressive disorder, single episode, unspecified: Secondary | ICD-10-CM | POA: Diagnosis not present

## 2014-12-21 DIAGNOSIS — E782 Mixed hyperlipidemia: Secondary | ICD-10-CM | POA: Insufficient documentation

## 2014-12-21 NOTE — Telephone Encounter (Signed)
Spoke with pt, advised pt of message from Dr. Alwyn Ren. Pt understood.

## 2014-12-21 NOTE — Telephone Encounter (Signed)
Left message for pt to call back  °

## 2014-12-22 ENCOUNTER — Encounter (HOSPITAL_COMMUNITY): Payer: Self-pay | Admitting: Emergency Medicine

## 2014-12-22 ENCOUNTER — Emergency Department (HOSPITAL_COMMUNITY)
Admission: EM | Admit: 2014-12-22 | Discharge: 2014-12-22 | Disposition: A | Discharge status: Home / Self Care | Payer: 59 | Attending: Emergency Medicine | Admitting: Emergency Medicine

## 2014-12-22 DIAGNOSIS — G4489 Other headache syndrome: Secondary | ICD-10-CM

## 2014-12-22 DIAGNOSIS — R51 Headache: Secondary | ICD-10-CM

## 2014-12-22 DIAGNOSIS — R519 Headache, unspecified: Secondary | ICD-10-CM

## 2014-12-22 MED ORDER — OXYCODONE-ACETAMINOPHEN 5-325 MG PO TABS
ORAL_TABLET | ORAL | Status: AC
  Filled 2014-12-22: qty 1

## 2014-12-22 MED ORDER — METOCLOPRAMIDE HCL 5 MG/ML IJ SOLN
10.0000 mg | Freq: Once | INTRAMUSCULAR | Status: AC
  Administered 2014-12-22: 10 mg via INTRAVENOUS
  Filled 2014-12-22: qty 2

## 2014-12-22 MED ORDER — OXYCODONE-ACETAMINOPHEN 5-325 MG PO TABS
1.0000 | ORAL_TABLET | Freq: Once | ORAL | Status: AC
  Administered 2014-12-22: 1 via ORAL

## 2014-12-22 MED ORDER — DIPHENHYDRAMINE HCL 50 MG/ML IJ SOLN
25.0000 mg | Freq: Once | INTRAMUSCULAR | Status: AC
  Administered 2014-12-22: 25 mg via INTRAVENOUS
  Filled 2014-12-22: qty 1

## 2014-12-22 NOTE — ED Provider Notes (Signed)
CSN: [id number]   Arrival date & time 12/21/14 2357  History  This chart was scribed for Zadie Rhine, MD by Bethel Born, ED Scribe. This patient was seen in room D36C/D36C and the patient's care was started at 3:51 AM.  Chief Complaint  Patient presents with  . Facial Pain    HPI Patient is a 49 y.o. female presenting with headaches. The history is provided by the patient. No language interpreter was used.  Headache Location: Facial. Severity currently:  8/10 Onset quality:  Sudden Duration:  7 hours Timing:  Constant Progression:  Unchanged Chronicity:  New Similar to prior headaches: no   Relieved by:  Nothing Worsened by:  Nothing Ineffective treatments:  None tried Associated symptoms: blurred vision and facial pain   Associated symptoms: no abdominal pain, no back pain, no fever, no focal weakness, no hearing loss, no nausea, no neck pain, no seizures, no vomiting and no weakness    Joanna Anderson is a 49 y.o. female with PMHx of migraines who presents to the Emergency Department complaining of atraumatic and constant right-sided facial pain with sudden onset around 9:30 PM while getting ready for bed. The pain is rated 6/10 in severity but was worse at initial onset. She took nothing for pain PTA. Associated symptoms include headache typical of her migraines, blurred vision, and facial twitching. Pt denies fever, vomiting, vision loss, dental pain, chest pain, abdominal pain, back pain, extremity weakness or numbness. No history of stroke or aneurysm.  Past Medical History  Diagnosis Date  . Other malaise and fatigue   . Routine general medical examination at a health care facility   . Shoulder pain   . Dysmenorrhea   . Left knee pain   . Dysuria   . Hematuria   . Anxiety   . Encounter for long-term (current) use of other medications   . Vitamin D deficiency   . Pure hyperglyceridemia   . Vitamin D deficiency   . Headache   . Migraine   . Depression   .  Anemia   . High cholesterol   . ASD (atrial septal defect) 06/11/2014    S/p patch repair  . VSD (ventricular septal defect), multiple 06/11/2014    S/p patch repair  . Atherosclerosis of abdominal aorta 06/11/2014    Age advanced atherosclerosis of the infrarenal aorta    Past Surgical History  Procedure Laterality Date  . C seaction    . Vetricular hole    . Cesarean section    . Knee arthroscopy w/ acl reconstruction and hamstring graft Left     Family History  Problem Relation Age of Onset  . Depression Mother   . High blood pressure Mother   . Hyperlipidemia Mother   . Anxiety disorder Mother   . Hypertension Mother   . High blood pressure Father   . Hyperlipidemia Father   . Hypertension Father   . Crohn's disease Father   . Kidney disease Father   . Peripheral vascular disease Father   . Peripheral vascular disease Paternal Grandfather     Social History  Substance Use Topics  . Smoking status: Former Games developer  . Smokeless tobacco: Never Used  . Alcohol Use: No     Review of Systems  Constitutional: Negative for fever.  HENT: Negative for hearing loss.        Facial pain  Eyes: Positive for blurred vision.       Blurred vision  Gastrointestinal: Negative for nausea, vomiting and  abdominal pain.  Musculoskeletal: Negative for back pain and neck pain.  Neurological: Positive for headaches. Negative for focal weakness, seizures and weakness.       Facial twitching  All other systems reviewed and are negative.  Home Medications   Prior to Admission medications   Medication Sig Start Date End Date Taking? Authorizing Provider  baclofen (LIORESAL) 10 MG tablet Take 1 tablet (10 mg total) by mouth 3 (three) times daily. Limit use to 1 to 2 days per week 12/20/14   Peyton Najjar, MD  clindamycin (CLINDAGEL) 1 % gel Apply topically 2 (two) times daily. 05/17/14   Peyton Najjar, MD  clonazePAM (KLONOPIN) 0.5 MG tablet Take 1 tablet (0.5 mg total) by mouth 2 (two) times  daily as needed for anxiety. 12/04/14   Peyton Najjar, MD  diclofenac sodium (VOLTAREN) 1 % GEL Apply 2 g topically 4 (four) times daily. 05/17/14   Peyton Najjar, MD  DULoxetine (CYMBALTA) 60 MG capsule Take 1 capsule (60 mg total) by mouth daily. 12/06/14   Wallis Bamberg, PA-C  meloxicam (MOBIC) 7.5 MG tablet Take 1-2 tablets (7.5-15 mg total) by mouth daily. 10/02/14   Wallis Bamberg, PA-C  ondansetron (ZOFRAN-ODT) 8 MG disintegrating tablet Take 1 tablet (8 mg total) by mouth every 8 (eight) hours as needed for nausea or vomiting. Patient not taking: Reported on 12/04/2014 05/17/14   Peyton Najjar, MD  promethazine (PHENERGAN) 25 MG tablet Take 25 mg by mouth every 6 (six) hours as needed for nausea or vomiting.    Historical Provider, MD  tiZANidine (ZANAFLEX) 4 MG tablet Take 0.5 tablets (2 mg total) by mouth every 8 (eight) hours as needed for muscle spasms. 03/11/14   Peyton Najjar, MD    Allergies  Sulfa antibiotics  Triage Vitals: BP 122/60 mmHg  Pulse 71  Temp(Src) 97.5 F (36.4 C) (Oral)  Resp 16  Ht  (1.676 m)  Wt 159 lb 7 oz (72.32 kg)  BMI 25.75 kg/m2  SpO2 99%  Physical Exam  Nursing note and vitals reviewed. CONSTITUTIONAL: Well developed/well nourished HEAD: Normocephalic/atraumatic EYES: EOMI/PERRL, no nystagmus, no ptosis, normal fundoscopic exam (no papilledema)  ENMT: Mucous membranes moist, no gingival swelling,  no signs of facial swelling, no bruising or crepitus to face NECK: supple no meningeal signs, no bruits SPINE/BACK:entire spine nontender CV: S1/S2 noted, no murmurs/rubs/gallops noted LUNGS: Lungs are clear to auscultation bilaterally, no apparent distress ABDOMEN: soft, nontender, no rebound or guarding GU:no cva tenderness NEURO:Awake/alert, facies symmetric, no arm or leg drift is noted Equal 5/5 strength with shoulder abduction, elbow flex/extension, wrist flex/extension in upper extremities and equal hand grips bilaterally Equal 5/5 strength with hip  flexion,knee flex/extension, foot dorsi/plantar flexion Cranial nerves 3/4/5/6/10/08/08/11/12 tested and intact Gait normal without ataxia No past pointing Sensation to light touch intact in all extremities EXTREMITIES: pulses normal, full ROM SKIN: warm, color normal PSYCH: no abnormalities of mood noted, alert and oriented to situation    ED Course  Procedures   Pt well appearing Reports HA similar to prior She report right facial pain was new She was in no distress, watching TV She was improved in the ED No neuro deficits to suggest acute CVA or other acute neurologic emergency Stable for d/c home   Medications  oxyCODONE-acetaminophen (PERCOCET/ROXICET) 5-325 MG per tablet 1 tablet (1 tablet Oral Given 12/22/14 0010)  metoCLOPramide (REGLAN) injection 10 mg (10 mg Intravenous Given 12/22/14 0427)  diphenhydrAMINE (BENADRYL) injection 25 mg (25  mg Intravenous Given 12/22/14 0427)      DIAGNOSTIC STUDIES: Oxygen Saturation is 99% on RA, normal by my interpretation.    COORDINATION OF CARE: 3:59 AM Discussed treatment plan which includes pain management with pt at bedside and pt agreed to plan.    MDM   Final diagnoses:  Other headache syndrome  Facial pain     Nursing notes including past medical history and social history reviewed and considered in documentation   I personally performed the services described in this documentation, which was scribed in my presence. The recorded information has been reviewed and is accurate.     Zadie Rhine, MD 12/22/14 [phone]

## 2014-12-22 NOTE — ED Notes (Signed)
Pt. reports right facial pain and right side headache onset this evening , denies injury , no fever or chills.

## 2014-12-22 NOTE — ED Notes (Signed)
Discharge instructions reviewed - voiced understanding 

## 2014-12-22 NOTE — ED Notes (Signed)
Pt states that she had the worst pain of her life at 2130 yesterday on the left side of her face, starting in her lower jaw and radiating up through her temple. Pt states that he family said that her face didn't "look right" when she was having the pain, but this RN sees no sign of facial droop or slurred speech.

## 2014-12-22 NOTE — Discharge Instructions (Signed)

## 2015-01-04 ENCOUNTER — Ambulatory Visit (INDEPENDENT_AMBULATORY_CARE_PROVIDER_SITE_OTHER): Payer: 59 | Admitting: Family Medicine

## 2015-01-04 VITALS — BP 122/80 | HR 94 | Temp 98.3°F | Resp 18 | Ht 66.75 in | Wt 160.8 lb

## 2015-01-04 DIAGNOSIS — R519 Headache, unspecified: Secondary | ICD-10-CM

## 2015-01-04 DIAGNOSIS — M797 Fibromyalgia: Secondary | ICD-10-CM | POA: Diagnosis not present

## 2015-01-04 DIAGNOSIS — R51 Headache: Secondary | ICD-10-CM

## 2015-01-04 MED ORDER — DULOXETINE HCL 30 MG PO CPEP: ORAL_CAPSULE | ORAL | Status: DC

## 2015-01-04 NOTE — Progress Notes (Signed)
Patient ID: Joanna Anderson, female    DOB: Dec 22, 1965  Age: 49 y.o. MRN: [id number]  Chief Complaint  Patient presents with  . Follow-up    for fibromyalgia. Cymbalta is not helping. Still having facial pain.     Subjective:   Patient here for a follow-up with regard to her fibromyalgia. The Cymbalta does not seem to be helping a lot. She has been having facial pain. She has episodes of shooting pain with rapid fire pain sensation going through the right side of her face. It goes into her temples also. No nausea or vomiting. She has been evaluated in the past for headaches by the headache clinic. I reviewed her old reports there. She has seen the rheumatologist who is not pinpoint any specific diagnosis specks things are most consistent with fibromyalgia. The latter he said to me is in the your records to be scanned. Do not have it with me today. No other major new complaints.  Current allergies, medications, problem list, past/family and social histories reviewed.  Objective:  BP 122/80 mmHg  Pulse 94  Temp(Src) 98.3 F (36.8 C) (Oral)  Resp 18  Ht 5' 6.75" (1.695 m)  Wt 160 lb 12.8 oz (72.938 kg)  BMI 25.39 kg/m2  SpO2 98%  No acute distress.  Her throat is. Neck supple without nodes. TMJ nontender. Eyes PERRLA. Fundi benign. Mild tenderness of the upper arms. No excessive tenderness of the muscles.  Assessment & Plan:   Assessment: 1. Nonintractable episodic headache, unspecified headache type   2. Fibromyalgia       Plan: Extensive somatic type complaints without good objective evidence. However the MRI a couple of years ago was a little abnormal. I believe we need to repeat that. This could be trigeminal neuralgia. However first we will up the dose of the Cymbalta. It was not quite classic for trigeminal neuralgia starches not begin carbamazepine. May need to refer back to neurology. She is to keep her appointment with the rheumatologist. Cymbalta will be given 60 in the  morning, 30 in the evening. See her back in about 5 weeks.  Orders Placed This Encounter  Procedures  . MR Brain W Wo Contrast    Standing Status: Future     Number of Occurrences:      Standing Expiration Date: 03/05/2016    Order Specific Question:  Reason for Exam (SYMPTOM  OR DIAGNOSIS REQUIRED)    Answer:  headaches, right facial pain; previous abnormal MRI with scattered white matter lesions    Order Specific Question:  Preferred imaging location?    Answer:  [facility]    Order Specific Question:  Does the patient have a pacemaker or implanted devices?    Answer:  No    Order Specific Question:  What is the patient's sedation requirement?    Answer:  No Sedation    Meds ordered this encounter  Medications  . cyclobenzaprine (FLEXERIL) 10 MG tablet    Sig: Take 10 mg by mouth 3 (three) times daily as needed for muscle spasms.  . pantoprazole (PROTONIX) 40 MG tablet    Sig: Take 40 mg by mouth daily.  . DULoxetine (CYMBALTA) 30 MG capsule    Sig: Take Cymbalta 30 mg before bedtime    Dispense:  30 capsule    Refill:  3    Pharmacist please note: Patient is on 60 mg of Cymbalta every morning and we are increasing to 90 mg so therefore giving the 30 mg pills  at night. If there is problems with coverage of this please let me know.         Patient Instructions  MRI is being scheduled of your head  Increase his Cymbalta to 60 mg in the morning, 30 in the evening  Keep your follow-up appointment with Dr. Kellie Simmering  Return at anytime if worse, otherwise let me see you back in about 5 weeks     Return in about 5 weeks (around 02/08/2015).   [place] Stringfield, MD 01/04/2015

## 2015-01-04 NOTE — Patient Instructions (Signed)
MRI is being scheduled of your head  Increase his Cymbalta to 60 mg in the morning, 30 in the evening  Keep your follow-up appointment with Dr. Kellie Simmering  Return at anytime if worse, otherwise let me see you back in about 5 weeks

## 2015-01-07 ENCOUNTER — Ambulatory Visit (HOSPITAL_COMMUNITY)
Admission: RE | Admit: 2015-01-07 | Discharge: 2015-01-07 | Disposition: A | Discharge status: Home / Self Care | Payer: 59 | Source: Ambulatory Visit | Attending: Family Medicine | Admitting: Family Medicine

## 2015-01-07 DIAGNOSIS — R9402 Abnormal brain scan: Secondary | ICD-10-CM | POA: Diagnosis not present

## 2015-01-07 DIAGNOSIS — R51 Headache: Secondary | ICD-10-CM | POA: Diagnosis present

## 2015-01-07 DIAGNOSIS — R519 Headache, unspecified: Secondary | ICD-10-CM

## 2015-01-07 MED ORDER — GADOBENATE DIMEGLUMINE 529 MG/ML IV SOLN
15.0000 mL | Freq: Once | INTRAVENOUS | Status: AC | PRN
  Administered 2015-01-07: 14 mL via INTRAVENOUS

## 2015-01-11 ENCOUNTER — Telehealth: Payer: Self-pay

## 2015-01-11 DIAGNOSIS — M6281 Muscle weakness (generalized): Secondary | ICD-10-CM

## 2015-01-11 DIAGNOSIS — M62838 Other muscle spasm: Secondary | ICD-10-CM

## 2015-01-11 NOTE — Telephone Encounter (Signed)
Pt would like to speak with Dr Alwyn Ren or his asst about how she is doing. Please call [phone]

## 2015-01-11 NOTE — Telephone Encounter (Signed)
Patient called again requesting to "speak to a nurse." clinical TL was not at her desk so offered to put in a message. She states the muscle spasms are "off the chart today." They are all over and she has low back pain, along with pain in both legs (left more than right). Would like a call back at [phone].

## 2015-01-11 NOTE — Telephone Encounter (Signed)
I called and left a long message on her answering machine. We can refer her back to the neurologist if she desires. I'm not sure what else to offer immediately. She can come back if needed. She is to call the team leader back and let her know if it is okay to make a referral back to neurology.

## 2015-01-12 NOTE — Telephone Encounter (Addendum)
Called and spoke with pt and she stated that all of this started back in April.  She stated that the cymbalta that she was started on has helped some, but she still has some muscle spasms and sometimes these are intense.  She stated that she is not sure what is going on with her and she stated that she is very worried.  She stated that sometimes she is feeling good and sometimes she is ok.  She stated that sometimes she is waking up at night with these muscle cramps.  She stated that some days she is shaking so much she is having a hard time functioning at work.  Pt stated that she would be willing to be referred to neurology for further eval of these muscle spasms.  Dr. Alwyn RenHopper, I have placed the referral for neurology for the pt.  She is aware.  She stated that she did not get the long message that you left on her machine.

## 2015-01-12 NOTE — Telephone Encounter (Signed)
Noted patient was communicated with.

## 2015-01-12 NOTE — Addendum Note (Signed)
Addended by: Marcellus ScottADKINS, CONNIE L on: 01/12/2015 09:42 AM   Modules accepted: Orders

## 2015-01-14 ENCOUNTER — Telehealth: Payer: Self-pay

## 2015-01-14 NOTE — Telephone Encounter (Signed)
Dr. Alwyn RenHopper can we explain this diagnosis.

## 2015-01-14 NOTE — Telephone Encounter (Signed)
Patient is calling to speak to a team leader. She state she was just on the phone with a TL but when I asked no one said they spoke with her.

## 2015-01-14 NOTE — Telephone Encounter (Signed)
Patient did not know she was diagnosed with Fibormyalgia, wants to talk with someone about this.    [phone]

## 2015-01-17 ENCOUNTER — Ambulatory Visit: Payer: 59 | Admitting: Neurology

## 2015-01-17 ENCOUNTER — Encounter: Payer: Self-pay | Admitting: Family Medicine

## 2015-01-20 ENCOUNTER — Encounter: Payer: Self-pay | Admitting: Family Medicine

## 2015-02-03 ENCOUNTER — Telehealth: Payer: Self-pay

## 2015-02-03 DIAGNOSIS — R51 Headache: Principal | ICD-10-CM

## 2015-02-03 DIAGNOSIS — R519 Headache, unspecified: Secondary | ICD-10-CM

## 2015-02-03 NOTE — Telephone Encounter (Signed)
Diagnoses     Nonintractable episodic headache, unspecified headache type - Primary    ICD-9-CM: 784.0 ICD-10-CM: R51    Fibromyalgia     ICD-9-CM: 729.1 ICD-10-CM: M79.7

## 2015-02-03 NOTE — Telephone Encounter (Signed)
Pt needs Dr. Alwyn RenHopper to re-write the pt's referral to neurology; the current referral states that the pt is being seen for Fibromyalgia and [facility] refuses to see her for this. The pt states that she has never been diagnosed for Fibromyalgia so she is confused.

## 2015-02-09 NOTE — Telephone Encounter (Signed)
A note was placed on the patient's referral on 02/08/15 by [facility] "Called and lm on vm for pt to call [facility] to sched apt w Dr. Gilman SchmidtYan/bws"

## 2015-02-15 ENCOUNTER — Telehealth: Payer: Self-pay | Admitting: Family Medicine

## 2015-02-15 NOTE — Telephone Encounter (Signed)
Received FMLA forms from [employer]. Patient's last OV was 01/04/2015. The visit notes state that the patient needs to RTC around 02/08/2015 if no improvement has been made. Left VM for patient to return my phone call so that I can explain this to her. I have scanned a blank copy of the patient's FMLA to her chart. I am placing the forms in Dr. Frederik PearHopper's box so that he will have when/if the patient decides to come in. Any questions, please see the Kinnie ScalesJazz or [place].

## 2015-02-16 ENCOUNTER — Ambulatory Visit (INDEPENDENT_AMBULATORY_CARE_PROVIDER_SITE_OTHER): Payer: 59 | Admitting: Family Medicine

## 2015-02-16 VITALS — BP 104/64 | HR 90 | Temp 98.5°F | Resp 18 | Ht 66.0 in

## 2015-02-16 DIAGNOSIS — G43809 Other migraine, not intractable, without status migrainosus: Secondary | ICD-10-CM | POA: Diagnosis not present

## 2015-02-16 DIAGNOSIS — T887XXA Unspecified adverse effect of drug or medicament, initial encounter: Secondary | ICD-10-CM | POA: Diagnosis not present

## 2015-02-16 DIAGNOSIS — T50905A Adverse effect of unspecified drugs, medicaments and biological substances, initial encounter: Secondary | ICD-10-CM

## 2015-02-16 NOTE — Telephone Encounter (Signed)
Patient returned phone call about her FMLA. She is going to come in to the office either today or tomorrow to see Dr. Alwyn RenHopper. Forms are in his box. Please complete during her OV. [facility].

## 2015-02-16 NOTE — Progress Notes (Signed)
Patient ID: Marcille BuffyDeborah S Pinette, female    DOB: [birth year]  Age: 49 y.o. MRN: [id number]  Chief Complaint  Patient presents with  . migranes    last x 48 hours, caugin facial pain  . Other    muscle spasms    Subjective:   Patient continues to have frequent problems with her headaches. She gets right-sided facial pain. She has taken the baclofen with some relief at times. She is scheduled to see the neurologist this week. I discussed her MRI report with her some. She has no other major complaints except for some abdominal pains. These been somewhat nonspecific Lonna CobbRomero in the left lower quadrant. Intermittent diarrhea and constipation. No weight loss or weight gain. She does not get a lot of regular exercise.  Current allergies, medications, problem list, past/family and social histories reviewed.  Objective:  BP 104/64 mmHg  Pulse 90  Temp(Src) 98.5 F (36.9 C) (Oral)  Resp 18  Ht 5\' 6"  (1.676 m)  SpO2 98%  No acute distress. Abdomen soft without mass or tenderness. Her long discussion about her headaches. Filled out a HML a form.  Assessment & Plan:   Assessment: 1. Other migraine without status migrainosus, not intractable   2. Medication adverse effect, initial encounter       Plan: HMLA  Orders Placed This Encounter  Procedures  . Basic metabolic panel   Patient was concern about interaction of Cymbalta and Mobic, which can cause hyponatremia, so check labs.    Patient Instructions  Plan to return in 2 months. Sooner if needed.  Keep the appointment with the neurologist.  I will let you know the results of your labs  B no when you need more of the Klonopin.    Return in about 2 months (around 04/18/2015).   HOPPER,DAVID, MD 02/16/2015

## 2015-02-16 NOTE — Patient Instructions (Signed)
Plan to return in 2 months. Sooner if needed.  Keep the appointment with the neurologist.  I will let you know the results of your labs  B no when you need more of the Klonopin.

## 2015-02-17 LAB — BASIC METABOLIC PANEL
BUN: 11 mg/dL (ref 7–25)
CALCIUM: 9.1 mg/dL (ref 8.6–10.2)
CO2: 28 mmol/L (ref 20–31)
CREATININE: 0.56 mg/dL (ref 0.50–1.10)
Chloride: 106 mmol/L (ref 98–110)
Glucose, Bld: 103 mg/dL — ABNORMAL HIGH (ref 65–99)
Potassium: 4.4 mmol/L (ref 3.5–5.3)
Sodium: 142 mmol/L (ref 135–146)

## 2015-02-17 NOTE — Telephone Encounter (Signed)
Received forms scanned them in and faxed them to [employer].

## 2015-02-22 ENCOUNTER — Ambulatory Visit (HOSPITAL_COMMUNITY)
Admission: RE | Admit: 2015-02-22 | Discharge: 2015-02-22 | Disposition: A | Discharge status: Home / Self Care | Payer: 59 | Source: Ambulatory Visit | Attending: Neurology | Admitting: Neurology

## 2015-02-22 ENCOUNTER — Ambulatory Visit (INDEPENDENT_AMBULATORY_CARE_PROVIDER_SITE_OTHER): Payer: 59 | Admitting: Neurology

## 2015-02-22 ENCOUNTER — Encounter: Payer: Self-pay | Admitting: Neurology

## 2015-02-22 ENCOUNTER — Telehealth: Payer: Self-pay | Admitting: Neurology

## 2015-02-22 VITALS — BP 121/71 | HR 89 | Ht 66.0 in | Wt 163.0 lb

## 2015-02-22 DIAGNOSIS — G43009 Migraine without aura, not intractable, without status migrainosus: Secondary | ICD-10-CM

## 2015-02-22 DIAGNOSIS — R531 Weakness: Secondary | ICD-10-CM | POA: Insufficient documentation

## 2015-02-22 DIAGNOSIS — R269 Unspecified abnormalities of gait and mobility: Secondary | ICD-10-CM

## 2015-02-22 DIAGNOSIS — Q210 Ventricular septal defect: Secondary | ICD-10-CM

## 2015-02-22 DIAGNOSIS — M47892 Other spondylosis, cervical region: Secondary | ICD-10-CM | POA: Diagnosis not present

## 2015-02-22 DIAGNOSIS — M542 Cervicalgia: Secondary | ICD-10-CM | POA: Insufficient documentation

## 2015-02-22 DIAGNOSIS — M62838 Other muscle spasm: Secondary | ICD-10-CM | POA: Insufficient documentation

## 2015-02-22 NOTE — Telephone Encounter (Signed)
Joanna Anderson, please call patient, no evidence of significant abnormality on MRI of cervical spine,   I have ordered MRI of thoracic and lumbar spine to further evaluate her complaints,   1. No acute findings or explanation for the patient's symptoms. 2. No evidence of demyelinating process or acute abnormality in the cervical cord. 3. Mild spondylosis as described, greatest at C5-6. No cord deformity, high-grade foraminal narrowing or definite nerve root encroachment.

## 2015-02-22 NOTE — Progress Notes (Signed)
PATIENT: Joanna Anderson DOB: 24-Nov-1965  Chief ComplaiMarcille Buffynt  Patient presents with  . Gait Problem    Says she has developed an unsteady gait and weakness in her lower extremities.  She also has full body muscle spasms and pain.  Concerned about her overactive bladder.  . Migraine    Her migraines have worsened.  She does not take daily medications but uses Maxalt.     HISTORICAL  Joanna BuffyDeborah S Anderson is a 49 years old right-handed female, seen in refer by her primary care  Doctor Davy PiqueJeffrey Cooper for evaluation of weakness, unsteady gait, body pain, muscle spasm.  I saw her previously for migraine headaches, she had past medical history of atrioseptal defect, open heart surgery at childhood. She used to have migraine headaches in her twenties, clustered around her menstruation period of time, Imitrex has been very helpful.  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, jewelleryregistered nurse at cardiac floor, she has been put off wintermittently   I have personally reviewed laboratory evaluation in November 2016, normal BMP CBC, ANA, CPK, vitamin D level   In this visit, she complains of subacute onset of gait difficulty, in April 2016, she felt low abdominal burning pain, then gradually noticed gait difficulty, especially when unloading grocery, her legs tends to give out underneath her while she holding object in her arms,  right leg worse than the left, in July  2016, she began to have frequent back muscle, bilateral calf muscle spasm, intermittent arm and leg muscle twitching,  her labs give out underneath her unexpectedly, she denies bowel and bladder incontinence, complains neck pain, low back pain  During the interview, she was noted to unappropriate emotional outburst, quick shift between sadness and laugh.  REVIEW OF SYSTEMS: Full 14 system review of systems performed and notable only for chill, trouble swallowing, abdominal pain, constipation, diarrhea, nausea, insomnia, eye pain, frequent urination, incontinence of bladder, back pain, achy muscles, muscle cramps, walking difficulty, neck pain, neck stiffness, dizziness, headaches, tremor  ALLERGIES: Allergies  Allergen Reactions  . Sulfa Antibiotics Hives    HOME MEDICATIONS: Current Outpatient Prescriptions  Medication Sig Dispense Refill  . baclofen (LIORESAL) 10 MG tablet Take 1 tablet (10 mg total) by mouth 3 (three) times daily. Limit use to 1 to 2 days per week 60 each 2  . clindamycin (CLINDAGEL) 1 % gel Apply topically 2 (two) times daily. 30 g 0  . clonazePAM (KLONOPIN) 0.5 MG tablet Take 1 tablet (0.5 mg total) by mouth 2 (two) times daily as needed for anxiety. 60 tablet 1  . cyclobenzaprine (FLEXERIL) 10 MG tablet Take 10 mg by mouth 3 (three) times daily as needed for muscle spasms.    . diclofenac sodium (VOLTAREN) 1 % GEL Apply 2 g topically 4 (four) times daily. 100 g 3  . DULoxetine (CYMBALTA) 30 MG capsule Take Cymbalta 30 mg before bedtime 30 capsule 3  . meloxicam (MOBIC) 7.5 MG tablet Take 1-2 tablets (7.5-15 mg  total) by mouth daily. 60 tablet 1  . NUVARING 0.12-0.015 MG/24HR vaginal ring   3  . ondansetron (ZOFRAN-ODT) 8 MG disintegrating tablet Take 1 tablet (8 mg total) by mouth every 8 (eight) hours as needed for nausea or vomiting. 20 tablet 2  . pantoprazole (PROTONIX) 40 MG tablet Take 40 mg by mouth daily.    . promethazine (PHENERGAN) 25 MG tablet Take 25 mg by  mouth every 6 (six) hours as needed for nausea or vomiting.    . rizatriptan (MAXALT-MLT) 10 MG disintegrating tablet   12  . tiZANidine (ZANAFLEX) 4 MG tablet Take 0.5 tablets (2 mg total) by mouth every 8 (eight) hours as needed for muscle spasms. 30 tablet 0   No current facility-administered medications for this visit.    PAST MEDICAL HISTORY: Past Medical History  Diagnosis Date  . Other malaise and fatigue   . Routine general medical examination at a health care facility   . Shoulder pain   . Dysmenorrhea   . Left knee pain   . Dysuria   . Hematuria   . Anxiety   . Encounter for long-term (current) use of other medications   . Vitamin D deficiency   . Pure hyperglyceridemia   . Vitamin D deficiency   . Headache   . Migraine   . Depression   . Anemia   . High cholesterol   . ASD (atrial septal defect) 06/11/2014    S/p patch repair  . VSD (ventricular septal defect), multiple 06/11/2014    S/p patch repair  . Atherosclerosis of abdominal aorta (HCC) 06/11/2014    Age advanced atherosclerosis of the infrarenal aorta    PAST SURGICAL HISTORY: Past Surgical History  Procedure Laterality Date  . C seaction    . Vetricular hole    . Cesarean section    . Knee arthroscopy w/ acl reconstruction and hamstring graft Left     FAMILY HISTORY: Family History  Problem Relation Age of Onset  . Depression Mother   . High blood pressure Mother   . Hyperlipidemia Mother   . Anxiety disorder Mother   . Hypertension Mother   . High blood pressure Father   . Hyperlipidemia Father   . Hypertension Father   . Crohn's disease Father   . Kidney disease Father   . Peripheral vascular disease Father   . Peripheral vascular disease Paternal Grandfather     SOCIAL HISTORY:  Social History   Social History  . Marital Status: Married    Spouse Name: N/A  . Number of Children: 4  . Years of Education: College   Occupational History  . RN     [employer]   Social History Main  Topics  . Smoking status: Former Games developer  . Smokeless tobacco: Never Used  . Alcohol Use: No  . Drug Use: No  . Sexual Activity: Not on file   Other Topics Concern  . Not on file   Social History Narrative   Lives at home with husband and children.   Right-handed.     PHYSICAL EXAM   Filed Vitals:   02/22/15 0829  BP: 121/71  Pulse: 89  Height:  (1.676 m)  Weight: 163 lb (73.936 kg)    Not recorded      Body mass index is 26.32 kg/(m^2).  PHYSICAL EXAMNIATION:  Gen: NAD, conversant, well nourised, obese, well groomed  Cardiovascular: Regular rate rhythm, no peripheral edema, warm, nontender. Eyes: Conjunctivae clear without exudates or hemorrhage Neck: Supple, no carotid bruise. Pulmonary: Clear to auscultation bilaterally   NEUROLOGICAL EXAM:  MENTAL STATUS: Speech:    Speech is normal; fluent and spontaneous with normal comprehension.  Cognition:     Orientation to time, place and person     Normal recent and remote memory     Normal Attention span and concentration     Normal Language, naming, repeating,spontaneous speech     Fund of knowledge   CRANIAL NERVES: CN II: Visual fields are full to confrontation. Fundoscopic exam is normal with sharp discs and no vascular changes. Pupils are round equal and briskly reactive to light. CN III, IV, VI: extraocular movement are normal. No ptosis. CN V: Facial sensation is intact to pinprick in all 3 divisions bilaterally. Corneal responses are intact.  CN VII: Face is symmetric with normal eye closure and smile. CN VIII: Hearing is normal to rubbing fingers CN IX, X: Palate elevates symmetrically. Phonation is normal. CN XI: Head turning and shoulder shrug are intact CN XII: Tongue is midline with normal movements and no atrophy.  MOTOR: There is no pronator drift of out-stretched arms. Muscle bulk and tone are normal. Muscle strength is normal.  REFLEXES: Reflexes are 2+ and symmetric  at the biceps, triceps, knees, and ankles. Plantar responses are flexor.  SENSORY: Intact to light touch, pinprick, position sense, and vibration sense are intact in fingers and toes.  COORDINATION: Rapid alternating movements and fine finger movements are intact. There is no dysmetria on finger-to-nose and heel-knee-shin.    GAIT/STANCE: She has variable effort during ambulate, mild unsteady stiff gait  DIAGNOSTIC DATA (LABS, IMAGING, TESTING) - I reviewed patient records, labs, notes, testing and imaging myself where available.   ASSESSMENT AND PLAN  Joanna Anderson is a 49 y.o. female  presenting with subacute onset of gait difficulty, bladder urgency, hyperreflexia on examinations  Need to rule out cervical spondylitic myelopathy  Proceed with stat MRI of cervical  Return to clinic in one week  Chronic migraines  Maxalt as needed  Levert Feinstein, M.D. Ph.D.  [facility] [address] [place], [place] [address] Ph: [phone] Fax: [phone]  CC: Quentin Mulling, MD

## 2015-02-25 ENCOUNTER — Other Ambulatory Visit: Payer: Self-pay | Admitting: Family Medicine

## 2015-02-25 ENCOUNTER — Telehealth: Payer: Self-pay

## 2015-02-25 DIAGNOSIS — F411 Generalized anxiety disorder: Secondary | ICD-10-CM

## 2015-02-25 MED ORDER — CLONAZEPAM 0.5 MG PO TABS: 0.5000 mg | ORAL_TABLET | Freq: Two times a day (BID) | ORAL | Status: DC | PRN

## 2015-02-25 NOTE — Telephone Encounter (Signed)
Pt is needing a refill on her klonopin and she has gone to a neurologist and she is following up as to why her leg went completely out on her while in the office   Best number [phone]

## 2015-03-01 ENCOUNTER — Encounter: Payer: Self-pay | Admitting: Neurology

## 2015-03-01 ENCOUNTER — Ambulatory Visit (INDEPENDENT_AMBULATORY_CARE_PROVIDER_SITE_OTHER): Payer: 59 | Admitting: Neurology

## 2015-03-01 VITALS — BP 104/72 | HR 84 | Ht 66.0 in | Wt 164.0 lb

## 2015-03-01 DIAGNOSIS — G43009 Migraine without aura, not intractable, without status migrainosus: Secondary | ICD-10-CM | POA: Diagnosis not present

## 2015-03-01 DIAGNOSIS — R269 Unspecified abnormalities of gait and mobility: Secondary | ICD-10-CM

## 2015-03-01 MED ORDER — GABAPENTIN 300 MG PO CAPS: 300.0000 mg | ORAL_CAPSULE | Freq: Three times a day (TID) | ORAL | Status: DC

## 2015-03-01 MED ORDER — SUMATRIPTAN SUCCINATE 50 MG PO TABS
50.0000 mg | ORAL_TABLET | ORAL | Status: DC | PRN
Start: 2015-03-01 — End: 2016-07-31

## 2015-03-01 NOTE — Progress Notes (Signed)
Chief Complaint  Patient presents with  . Gait Problem    She is still having gait difficulty due to her right leg weakness.  She has a fall at work yesterday.  She would like to discuss her cervical MRI.  She has not completed her lumbar or thoracic scans yet.      PATIENT: Joanna Anderson DOB: 1965/04/14  Chief Complaint  Patient presents with  . Gait Problem    She is still having gait difficulty due to her right leg weakness.  She has a fall at work yesterday.  She would like to discuss her cervical MRI.  She has not completed her lumbar or thoracic scans yet.     HISTORICAL  Joanna Anderson is a 49 years old right-handed female, seen in refer by her primary care  Doctor Davy PiqueJeffrey Cooper for evaluation of weakness, unsteady gait, body pain, muscle spasm.  I saw her previously for migraine headaches, she had past medical history of atrioseptal defect, open heart surgery at childhood. She used to have migraine headaches in her twenties, clustered around her menstruation period of time, Imitrex has been very helpful.  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, jewelleryregistered nurse at cardiac floor, she has been put off wintermittently   I have personally reviewed laboratory evaluation in November 2016, normal BMP CBC, ANA, CPK, vitamin D level   In this visit, she complains of subacute onset of gait difficulty, in April 2016, she felt low abdominal  burning pain, then gradually noticed gait difficulty, especially when unloading grocery, her legs tends to give out underneath her while she holding object in her arms,  right leg worse than the left, in July 2016, she began to have frequent back muscle, bilateral calf muscle spasm, intermittent arm and leg muscle twitching,  her labs give out underneath her unexpectedly, she denies bowel and bladder incontinence, complains neck pain, low back pain  During the interview, she was noted to unappropriate emotional outburst, quick shift between sadness and laugh.  UPDATE Mar 01 2015: She still has problem with her leg, right leg gave out underneath her sometimes,  "could not send signal to her right leg muscle", she complains of right above knee and right low back pain,   She also complains of right facial pain, intermittent right retro-orbital area headache with associated light noise sensitivity, nauseous.   We have reviewed MRI of the brain, mild supratentorium small gliosis, consistent with small vessel disease no acute lesions, MRI of cervical spine, multilevel cervical degenerative changes, there was no significant canal, foraminal stenosis. REVIEW OF SYSTEMS: Full 14 system review of systems performed and notable only for chill, fatigue, trouble swallowing, heat intolerance, abdominal pain, nausea, insomnia, frequent urination, joint pain, back pain, achy muscles, muscle cramps, walking difficulty, neck pain, neck stiffness, headaches, weakness, tremor, itching  ALLERGIES: Allergies  Allergen Reactions  . Sulfa Antibiotics Hives    HOME MEDICATIONS: Current Outpatient Prescriptions  Medication Sig Dispense Refill  .  baclofen (LIORESAL) 10 MG tablet Take 1 tablet (10 mg total) by mouth 3 (three) times daily. Limit use to 1 to 2 days per week 60 each 2  . clindamycin (CLINDAGEL) 1 % gel Apply topically 2 (two) times daily. 30 g 0  . clonazePAM (KLONOPIN) 0.5 MG tablet Take 1 tablet (0.5 mg  total) by mouth 2 (two) times daily as needed for anxiety. 60 tablet 0  . cyclobenzaprine (FLEXERIL) 10 MG tablet Take 10 mg by mouth 3 (three) times daily as needed for muscle spasms.    . diclofenac sodium (VOLTAREN) 1 % GEL Apply 2 g topically 4 (four) times daily. 100 g 3  . DULoxetine (CYMBALTA) 30 MG capsule Take Cymbalta 30 mg before bedtime 30 capsule 3  . meloxicam (MOBIC) 7.5 MG tablet Take 1-2 tablets (7.5-15 mg total) by mouth daily. 60 tablet 1  . NUVARING 0.12-0.015 MG/24HR vaginal ring   3  . ondansetron (ZOFRAN-ODT) 8 MG disintegrating tablet Take 1 tablet (8 mg total) by mouth every 8 (eight) hours as needed for nausea or vomiting. 20 tablet 2  . pantoprazole (PROTONIX) 40 MG tablet Take 40 mg by mouth daily.    . promethazine (PHENERGAN) 25 MG tablet Take 25 mg by mouth every 6 (six) hours as needed for nausea or vomiting.    . rizatriptan (MAXALT-MLT) 10 MG disintegrating tablet   12  . tiZANidine (ZANAFLEX) 4 MG tablet Take 0.5 tablets (2 mg total) by mouth every 8 (eight) hours as needed for muscle spasms. 30 tablet 0   No current facility-administered medications for this visit.    PAST MEDICAL HISTORY: Past Medical History  Diagnosis Date  . Other malaise and fatigue   . Routine general medical examination at a health care facility   . Shoulder pain   . Dysmenorrhea   . Left knee pain   . Dysuria   . Hematuria   . Anxiety   . Encounter for long-term (current) use of other medications   . Vitamin D deficiency   . Pure hyperglyceridemia   . Vitamin D deficiency   . Headache   . Migraine   . Depression   . Anemia   . High cholesterol   . ASD (atrial septal defect) 06/11/2014    S/p patch repair  . VSD (ventricular septal defect), multiple 06/11/2014    S/p patch repair  . Atherosclerosis of abdominal aorta (HCC) 06/11/2014    Age advanced atherosclerosis of the infrarenal aorta    PAST SURGICAL HISTORY: Past Surgical History  Procedure Laterality Date  .  C seaction    . Vetricular hole    . Cesarean section    . Knee arthroscopy w/ acl reconstruction and hamstring graft Left     FAMILY HISTORY: Family History  Problem Relation Age of Onset  . Depression Mother   . High blood pressure Mother   . Hyperlipidemia Mother   . Anxiety disorder Mother   . Hypertension Mother   . High blood pressure Father   . Hyperlipidemia Father   . Hypertension Father   . Crohn's disease Father   . Kidney disease Father   . Peripheral vascular disease Father   . Peripheral vascular disease Paternal Grandfather     SOCIAL HISTORY:  Social History   Social History  . Marital Status: Married    Spouse Name: N/A  . Number of Children: 4  . Years of Education: College   Occupational History  . RN  [employer]   Social History Main Topics  . Smoking status: Former Games developer  . Smokeless tobacco: Never Used  . Alcohol Use: No  . Drug Use: No  . Sexual Activity: Not on file   Other Topics Concern  . Not on file   Social History Narrative   Lives at home with husband and children.   Right-handed.     PHYSICAL EXAM   Filed Vitals:   03/01/15 0938  BP: 104/72  Pulse: 84  Height:  (1.676 m)  Weight: 164 lb (74.39 kg)    Not recorded      Body mass index is 26.48 kg/(m^2).  PHYSICAL EXAMNIATION:  Gen: NAD, conversant, well nourised, obese, well groomed                     Cardiovascular: Regular rate rhythm, no peripheral edema, warm, nontender. Eyes: Conjunctivae clear without exudates or hemorrhage Neck: Supple, no carotid bruise. Pulmonary: Clear to auscultation bilaterally   NEUROLOGICAL EXAM:  MENTAL STATUS: Speech:    Speech is normal; fluent and spontaneous with normal comprehension.  Cognition:     Orientation to time, place and person     Normal recent and remote memory     Normal Attention span and concentration     Normal Language, naming, repeating,spontaneous speech     Fund of knowledge   CRANIAL  NERVES: CN II: Visual fields are full to confrontation. Fundoscopic exam is normal with sharp discs and no vascular changes. Pupils are round equal and briskly reactive to light. CN III, IV, VI: extraocular movement are normal. No ptosis. CN V: Facial sensation is intact to pinprick in all 3 divisions bilaterally. Corneal responses are intact.  CN VII: Face is symmetric with normal eye closure and smile. CN VIII: Hearing is normal to rubbing fingers CN IX, X: Palate elevates symmetrically. Phonation is normal. CN XI: Head turning and shoulder shrug are intact CN XII: Tongue is midline with normal movements and no atrophy.  MOTOR: There is no pronator drift of out-stretched arms. Muscle bulk and tone are normal. Muscle strength is normal.  REFLEXES: Reflexes are 2+ and symmetric at the biceps, triceps, knees, and ankles. Plantar responses are flexor.  SENSORY: Intact to light touch, pinprick, position sense, and vibration sense are intact in fingers and toes.  COORDINATION: Rapid alternating movements and fine finger movements are intact. There is no dysmetria on finger-to-nose and heel-knee-shin.    GAIT/STANCE: She has variable effort during ambulate, mild unsteady stiff gait  DIAGNOSTIC DATA (LABS, IMAGING, TESTING) - I reviewed patient records, labs, notes, testing and imaging myself where available.   ASSESSMENT AND PLAN  Joanna Anderson is a 49 y.o. female  presenting with subacute onset of gait difficulty, bladder urgency, hyperreflexia on examinations  Gait difficulty  MRI of cervical: Showed mild degenerative changes, no evidence of nerve roots, or canal stenosis.  She continue complains of right leg give out underneath her intermittently, proceed with MRI of the thoracic, lumbar spine, I will call her report.    Chronic migraines  Imitrex as needed   Right facial pain  Gabapentin 300 mg 3 times a day  Levert Feinstein, M.D. Ph.D.  [facility] [address], [address] [place], [place] [address] Ph: [phone] Fax: [phone]  CC: Quentin Mulling, MD

## 2015-03-02 ENCOUNTER — Telehealth: Payer: Self-pay | Admitting: Neurology

## 2015-03-02 ENCOUNTER — Telehealth: Payer: Self-pay

## 2015-03-02 DIAGNOSIS — R269 Unspecified abnormalities of gait and mobility: Secondary | ICD-10-CM | POA: Diagnosis not present

## 2015-03-02 NOTE — Telephone Encounter (Signed)
Patient called back requesting to speak with nurse. Please call [phone].

## 2015-03-02 NOTE — Telephone Encounter (Signed)
Patient called to advise both legs are not working, she can't walk.

## 2015-03-02 NOTE — Telephone Encounter (Signed)
Spoke to Sun MicrosystemsDeborah - she had her MRI scans completed today at 4pm [facility]).  She would like a call with the results, once they are available.

## 2015-03-02 NOTE — Telephone Encounter (Signed)
Spoke to patient - she is able to walk but is having continued weakness, especially in her right leg.  She has still not called to scheduled her thoracic and lumbar MRI scans yet.  We have provided her the number multiple times and encouraged her to call.  I spoke with her about the importance of getting these tests completed and she stated she would call today for the appointments.

## 2015-03-02 NOTE — Telephone Encounter (Signed)
Patient is unable to walk.  [phone]

## 2015-03-02 NOTE — Telephone Encounter (Signed)
SHe has been to the neurologist and had a CT scan. She is having more scans done with her neurologist. I am not sure if this is an [employer]I message.

## 2015-03-03 ENCOUNTER — Ambulatory Visit (INDEPENDENT_AMBULATORY_CARE_PROVIDER_SITE_OTHER): Payer: 59

## 2015-03-03 ENCOUNTER — Ambulatory Visit (INDEPENDENT_AMBULATORY_CARE_PROVIDER_SITE_OTHER): Payer: Self-pay

## 2015-03-03 DIAGNOSIS — R269 Unspecified abnormalities of gait and mobility: Secondary | ICD-10-CM

## 2015-03-03 DIAGNOSIS — Z0289 Encounter for other administrative examinations: Secondary | ICD-10-CM

## 2015-03-03 NOTE — Telephone Encounter (Signed)
Patient called back, is looking for test results and doesn't understand why it's taking so long, she needs to know something, what's causing her symptoms.

## 2015-03-03 NOTE — Telephone Encounter (Addendum)
Pt called and says she does not feel any better and has another headache. She would also like a call back about her MRI results. Please call and advise [phone].

## 2015-03-03 NOTE — Telephone Encounter (Signed)
I called the patient and advised that her MRI is on the reading schedule for tomorrow.

## 2015-03-03 NOTE — Telephone Encounter (Signed)
I have called her.  Overall unremarkable MRI thoracic spine (without). Mild scoliosis convex left at T3 and convex right at T10-11 noted. No spinal stenosis or foraminal stenosis. No intrinsic spinal cord lesions.   Normal MRI lumbar spine (without).  She continue complains of frequent falling, legs give out underneath her  Her migraine has much improved with repeat dose of Imitrex

## 2015-03-03 NOTE — Telephone Encounter (Signed)
I called the patient. She c/o having a headache. She has not taken Imitrex today. She stated she took 1 tablet yesterday and it did not help. I advised that she should take another Imitrex. I also advised that she can take a 2nd Imitrex 2 hours after the first if it is not helping, but I cautioned that she should not take more than 2 Imitrex in a 24 hour period. I asked that she call us back if the Imitrex does not help today. I also advised that we do not have MRI results yet.

## 2015-03-04 ENCOUNTER — Ambulatory Visit (HOSPITAL_COMMUNITY): Payer: 59

## 2015-03-07 ENCOUNTER — Ambulatory Visit (INDEPENDENT_AMBULATORY_CARE_PROVIDER_SITE_OTHER): Payer: 59 | Admitting: Family Medicine

## 2015-03-07 VITALS — BP 118/78 | HR 93 | Temp 98.4°F | Resp 16 | Ht 66.0 in | Wt 165.2 lb

## 2015-03-07 DIAGNOSIS — F449 Dissociative and conversion disorder, unspecified: Secondary | ICD-10-CM

## 2015-03-07 DIAGNOSIS — R29898 Other symptoms and signs involving the musculoskeletal system: Secondary | ICD-10-CM

## 2015-03-07 DIAGNOSIS — G43909 Migraine, unspecified, not intractable, without status migrainosus: Secondary | ICD-10-CM

## 2015-03-07 NOTE — Patient Instructions (Signed)
Decrease the Cymbalta to 60 mg each a.m., discontinue the p.m. dose  Continue the gabapentin  Return in 10 days  Stay off from work  Physical therapy as planned

## 2015-03-07 NOTE — Progress Notes (Signed)
Patient ID: Joanna Anderson, female    DOB: 06/04/1965  Age: 49 y.o. MRN: [id number]  Chief Complaint  Patient presents with  . Hospitalization Follow-up    Conversion disorder, denies depression  . Leg Pain    Both legs  . Referral  . Other    FMLA Paper work. Patient states it has been faxed over this morning    Subjective:   Patient has gone through a good deal since I saw her last time. She has seen the neurologist on a couple occasions. She had some sudden severe weakness of the right lower extremity in the thigh region primarily. She stumbled and had trouble walking. This happened first when she was walking into the neurologist office. She has subsequently had similar problem in the left leg. She has been evaluated with MRIs of the neck, thoracic, and lumbar spines. She finally went over to [facility] where she went to the emergency room and was evaluated also by a neurologist. The neurologist agreed with Dr. Terrace Arabia that this was a psychological issue, stress-induced, in the conversion reaction type problem. The patient is very embarrassed about such labeling, says people think she is crazy. She is married, stable marriage. They struggled at earlier times in the marriage but seemed to be doing well now. Husband is a Education officer, environmental. She has 4 children. The second child wants to move out of the home to so her oats, which is a little stressful. Her youngest is 47 years old. She works full-time. Goes to school. She is always on the go. Her migraines require occasional Imitrex. The other doctors have talked about getting a physical therapy referral for her, but the doctors at [facility] did not know where to refer to over here. Current allergies, medications, problem list, past/family and social histories reviewed.  Objective:  BP 118/78 mmHg  Pulse 93  Temp(Src) 98.4 F (36.9 C) (Oral)  Resp 16  Ht  (1.676 m)  Wt 165 lb 3.2 oz (74.934 kg)  BMI 26.68 kg/m2  SpO2 96%  Leg strength  seems adequate today. She is walking with a walker, though on observation her strength is fairly good. She had difficulty moving in the exam room, but when she exited the building went down the hallway fairly well.  Assessment & Plan:   Assessment: 1. Leg weakness, bilateral   2. Migraine without status migrainosus, not intractable, unspecified migraine type   3. Conversion reaction       Plan: Agree with the neurology evaluations. Recommend follow-up with her neurologist here in [place]. Also recommended considering proceeding on with getting physical therapy. I don't know whether her medicines could be adding to problems or whether they're helping her problems suggested cutting back on the Cymbalta from 90 mg to 60 mg daily. We'll see her back in a 10 days. She said that FMLA paperwork and faxed to me today, but I could not locate it. I'll watch for that to come.   We'll refer her to psychiatry.  Orders Placed This Encounter  Procedures  . Ambulatory referral to Psychiatry    Referral Priority:  Routine    Referral Type:  Psychiatric    Referral Reason:  Specialty Services Required    Referred to Provider:  Milagros Evener, MD    Requested Specialty:  Psychiatry    Number of Visits Requested:  1    Meds ordered this encounter  Medications  . ibuprofen (ADVIL,MOTRIN) 200 MG tablet    Sig: Take  200 mg by mouth every 6 (six) hours as needed (take 800 mg PO q6h prn for pain).     Patient Instructions  Decrease the Cymbalta to 60 mg each a.m., discontinue the p.m. dose  Continue the gabapentin  Return in 10 days  Stay off from work  Physical therapy as planned     No Follow-up on file.   Sevan Mcbroom, MD 03/07/2015

## 2015-03-10 NOTE — Telephone Encounter (Signed)
Patient called regarding leg giving out on her, 1st it was Right leg in [employer] parking lot at appointment 02/22/15 then Left leg, woke up last Friday 03/04/15 unable to walk, went to [facility], was diagnosed with conversion disorder, patient can't believe that it is a psychogenic disorder, can't fathom it, is not able to cope with this diagnosis, unable to work, too embarrassed to tell friends this is what's wrong when they ask, she thinks it's another problem that's being missed.

## 2015-03-10 NOTE — Telephone Encounter (Signed)
Spoke to Dr. Terrace ArabiaYan - called patient and gently explained to her that the next step would be to offer a psychiatic referral.  States she is already established with [facility] [place] and will call them to make an appointment.

## 2015-03-14 ENCOUNTER — Telehealth: Payer: Self-pay

## 2015-03-14 NOTE — Telephone Encounter (Signed)
[employer] faxed over FMLA forms to be completed by Dr Alwyn RenHopper, I have filled out what I could from the OV notes and highlighted what needs to be completed. Please fill out forms and return to the FMLA box at the 102 checkout desk with in 5-7 business days. I will place them in your box on 03/14/15. Thank you!

## 2015-03-17 ENCOUNTER — Other Ambulatory Visit: Payer: Self-pay | Admitting: *Deleted

## 2015-03-17 ENCOUNTER — Telehealth: Payer: Self-pay | Admitting: Neurology

## 2015-03-17 NOTE — Telephone Encounter (Signed)
There is a current referral in Epic, placed by Dr. Alwyn RenHopper on 03/07/15.

## 2015-03-17 NOTE — Telephone Encounter (Signed)
Pt's husband called and state that his wife is in so much pain that she is screaming in pain. That " her head is about to explode". She can not see. Husband is very upset and wants to speak with someone "now". He would not finish the conversation with me to find out about stroke symptoms. He would like a call back as soon as possible. He was advised to take her to the ER.  [phone].

## 2015-03-17 NOTE — Telephone Encounter (Signed)
I have spoken with her husband.

## 2015-03-17 NOTE — Telephone Encounter (Signed)
I spoke with Lorin PicketScott (husband on HIPPA) earlier.  First, I encouragde him to take Gavin PoundDeborah to the ED. He stated these were similar symptoms to what she has been experiencing.  I still told him it would be safest to let a physician see her for a physical evaluation.  He told me she had an eye exam [facility] on 03/16/15 and it was abnormal.  I requested records and Dr. Terrace ArabiaYan reviewed. There was not anything neurologically concerning revealed on her exam.  I called Scott back to let him know she had reviewed the records.  I inquired about her psychiatric appointment and he told me she called [facility] and had not heard back from them (she is established with Barbette Merinoarolyn McDonald).  I called the provider's office and was told they did not have a message from her.  I ask them to call her to make an appointment and provided her phone number and Scott's phone number.  I called Lorin PicketScott again to let him know to expect a call and if he has not heard from them today, to call them back and provided the office number to him.  I again encourage him to take Gavin PoundDeborah to the ED.

## 2015-03-18 ENCOUNTER — Ambulatory Visit: Payer: 59 | Admitting: Physical Therapy

## 2015-03-18 ENCOUNTER — Ambulatory Visit (INDEPENDENT_AMBULATORY_CARE_PROVIDER_SITE_OTHER): Payer: 59 | Admitting: Family Medicine

## 2015-03-18 VITALS — BP 116/80 | HR 83 | Temp 98.8°F | Resp 16 | Ht 66.0 in | Wt 169.4 lb

## 2015-03-18 DIAGNOSIS — R29898 Other symptoms and signs involving the musculoskeletal system: Secondary | ICD-10-CM | POA: Diagnosis not present

## 2015-03-18 DIAGNOSIS — R51 Headache: Secondary | ICD-10-CM

## 2015-03-18 DIAGNOSIS — R519 Headache, unspecified: Secondary | ICD-10-CM

## 2015-03-18 NOTE — Progress Notes (Signed)
Patient ID: Joanna BuffyDeborah S Anderson, female    DOB: 1965/09/01  Age: 49 y.o. MRN: [id number]  Chief Complaint  Patient presents with  . Follow-up     leg weakness, and pat states she is not feeling better.    Subjective:   Patient continues to have a great deal of weakness in her legs. She has fallen a number of times. She has periodic episodes of severe headache. Yesterday she had one for about 4 hours where she was just screaming with pain. She continues to be on a number of medications. She did decrease the Cymbalta dose as directed to 30 mg daily. She is still not working. They do not have a sedentary job for her. She is very discouraged. She has the feeling that people do not listen to her. She doesn't think this could be a neuropsychiatric problem, does not believe this is any conversion reaction doing this.  We had a long discussion today. See notes below. Her husband was with her today.  Current allergies, medications, problem list, past/family and social histories reviewed.  Objective:  BP 116/80 mmHg  Pulse 83  Temp(Src) 98.8 F (37.1 C) (Oral)  Resp 16  Ht 5\' 6"  (1.676 m)  Wt 169 lb 6.4 oz (76.839 kg)  BMI 27.35 kg/m2  SpO2 96%  Continues to walk with a walker with some difficulty.  Assessment & Plan:   Assessment: 1. Weakness of both lower extremities   2. Frequent headaches       Plan: A long review of the medical records, looking at what other doctors have said. We reviewed her medications. I looked up side effects of various medications. It is my opinion that she still needs to be on some Cymbalta because of her anxiety and depression. I think she needs to get away from taking the baclofen since it can have some unusual side effects. They asked about Lyme disease, and although I was going to go ahead and draw a titer for them I forgot to do so before they left. She agreed to try and minimize her medications. She is only take the Neurontin about twice a day rather than 3  times a day because she doesn't tolerate more. She did agree with needing to see another neurologist, and will try to get in [facility]. Spent over 40 minutes talking with him.  I explained that I'm not an expert on neuromuscular diseases, really think she needs someone else making the diagnosis. I do think that an emotional etiology for this is still real possibility, but it also could be something else and so she needs to be seen by neurology. She also needs to be seen by psychiatry. She understands that.  Orders Placed This Encounter  Procedures  . Ambulatory referral to Neurology    Referral Priority:  Routine    Referral Type:  Consultation    Referral Reason:  Specialty Services Required    Requested Specialty:  Neurology    Number of Visits Requested:  1        Patient Instructions  Wean yourself off of the baclofen if possible  Referral will be [facility] to [facility] for another opinion  Return in 2 weeks      Return in about 2 weeks (around 04/01/2015).   HOPPER,DAVID, MD 03/18/2015

## 2015-03-18 NOTE — Patient Instructions (Signed)
Wean yourself off of the baclofen if possible  Referral will be [facility] for [facility]  Return in 2 weeks

## 2015-03-21 ENCOUNTER — Telehealth: Payer: Self-pay | Admitting: Neurology

## 2015-03-21 NOTE — Telephone Encounter (Signed)
Please advise patient that if she has any sudden neurological problems such as one-sided facial weakness or droopy eyelids or sudden vision change or one-sided numbness or tingling or weakness, she has to call 911 or go to the emergency room.

## 2015-03-21 NOTE — Telephone Encounter (Signed)
Joanna SessionsMichelle FYI: I spoke to patient and she is aware of recommendations. She states that she feels like it is not a stroke or TIA and says "it would be too late anyway". Patient voices understanding of the risks of not being evaluated. I spoke to patient about referral that PCP made for second opinion to [facility] and encouraged her to follow through with this. Patient also states that she is trying to get a sooner appt with psychiatrist.  She will also ask her PCP about getting Lyme Disease titer (as mentioned in last office note). I spoke to the patient for 17 minutes offering her support and councilling for her current concerns.

## 2015-03-21 NOTE — Telephone Encounter (Signed)
Dr. Terrace ArabiaYan patient: I spoke to patient and she reports that she woke up on 03/16/15 with drooping of her L eyelid and L eyebrow, and some vision change in that eye. This has improved overtime but will get worse when she is tired. She saw the eye doctor the same day Marcelino Duster(Michelle had received records of this exam), according to the patient the eye doctor did notice vision change in the L eye. She also saw PCP 03/18/15 who made no mention of the drooping. Patient reports that gabapentin is the only thing she takes regularly, her other medications she takes as needed. Looks like PCP made referral to [facility] for second opinion. Anything you would like for me to relay to the patient?

## 2015-03-21 NOTE — Telephone Encounter (Signed)
Pt called sts her eyelid is drooping and eyebrow and that section of face since 03/16/15, it has not affected that side of her mouth. She said it is not as bad this morning, seems it comes and goes. She sts she feels pain behind the eyeball. She saw a provider at [facility] and he suggested she let a provider know. Yesterday she saw a friend but felt she could not get her words out. She is concerned it is something neurological. Pt sts she off all meds except what Dr Terrace ArabiaYan prescribed except for clonopin.

## 2015-03-22 ENCOUNTER — Encounter: Payer: Self-pay | Admitting: Physical Therapy

## 2015-03-22 ENCOUNTER — Encounter (HOSPITAL_COMMUNITY): Payer: Self-pay | Admitting: Emergency Medicine

## 2015-03-22 ENCOUNTER — Encounter: Payer: Self-pay | Admitting: Neurology

## 2015-03-22 ENCOUNTER — Ambulatory Visit: Payer: 59 | Attending: Psychiatry | Admitting: Physical Therapy

## 2015-03-22 ENCOUNTER — Emergency Department (HOSPITAL_COMMUNITY)
Admission: EM | Admit: 2015-03-22 | Discharge: 2015-03-22 | Disposition: A | Discharge status: Home / Self Care | Payer: 59 | Attending: Emergency Medicine | Admitting: Emergency Medicine

## 2015-03-22 DIAGNOSIS — R531 Weakness: Secondary | ICD-10-CM | POA: Insufficient documentation

## 2015-03-22 DIAGNOSIS — Z8742 Personal history of other diseases of the female genital tract: Secondary | ICD-10-CM | POA: Diagnosis not present

## 2015-03-22 DIAGNOSIS — Z79899 Other long term (current) drug therapy: Secondary | ICD-10-CM | POA: Diagnosis not present

## 2015-03-22 DIAGNOSIS — R260 Ataxic gait: Secondary | ICD-10-CM | POA: Insufficient documentation

## 2015-03-22 DIAGNOSIS — Z8639 Personal history of other endocrine, nutritional and metabolic disease: Secondary | ICD-10-CM | POA: Diagnosis not present

## 2015-03-22 DIAGNOSIS — G43909 Migraine, unspecified, not intractable, without status migrainosus: Secondary | ICD-10-CM | POA: Diagnosis not present

## 2015-03-22 DIAGNOSIS — Z862 Personal history of diseases of the blood and blood-forming organs and certain disorders involving the immune mechanism: Secondary | ICD-10-CM | POA: Insufficient documentation

## 2015-03-22 DIAGNOSIS — R269 Unspecified abnormalities of gait and mobility: Secondary | ICD-10-CM | POA: Insufficient documentation

## 2015-03-22 DIAGNOSIS — F329 Major depressive disorder, single episode, unspecified: Secondary | ICD-10-CM | POA: Diagnosis not present

## 2015-03-22 DIAGNOSIS — R1310 Dysphagia, unspecified: Secondary | ICD-10-CM | POA: Diagnosis not present

## 2015-03-22 DIAGNOSIS — Z792 Long term (current) use of antibiotics: Secondary | ICD-10-CM | POA: Insufficient documentation

## 2015-03-22 DIAGNOSIS — Z87891 Personal history of nicotine dependence: Secondary | ICD-10-CM | POA: Diagnosis not present

## 2015-03-22 DIAGNOSIS — F419 Anxiety disorder, unspecified: Secondary | ICD-10-CM | POA: Diagnosis not present

## 2015-03-22 DIAGNOSIS — R29898 Other symptoms and signs involving the musculoskeletal system: Secondary | ICD-10-CM | POA: Insufficient documentation

## 2015-03-22 DIAGNOSIS — Z791 Long term (current) use of non-steroidal anti-inflammatories (NSAID): Secondary | ICD-10-CM | POA: Insufficient documentation

## 2015-03-22 DIAGNOSIS — M549 Dorsalgia, unspecified: Secondary | ICD-10-CM | POA: Diagnosis not present

## 2015-03-22 LAB — CBC WITH DIFFERENTIAL/PLATELET
BASOS ABS: 0 10*3/uL (ref 0.0–0.1)
BASOS PCT: 0 %
Eosinophils Absolute: 0.1 10*3/uL (ref 0.0–0.7)
Eosinophils Relative: 1 %
HEMATOCRIT: 40.2 % (ref 36.0–46.0)
HEMOGLOBIN: 13.6 g/dL (ref 12.0–15.0)
LYMPHS PCT: 25 %
Lymphs Abs: 2.7 10*3/uL (ref 0.7–4.0)
MCH: 30.4 pg (ref 26.0–34.0)
MCHC: 33.8 g/dL (ref 30.0–36.0)
MCV: 89.7 fL (ref 78.0–100.0)
Monocytes Absolute: 0.8 10*3/uL (ref 0.1–1.0)
Monocytes Relative: 7 %
NEUTROS ABS: 7.2 10*3/uL (ref 1.7–7.7)
NEUTROS PCT: 67 %
Platelets: 195 10*3/uL (ref 150–400)
RBC: 4.48 MIL/uL (ref 3.87–5.11)
RDW: 12.5 % (ref 11.5–15.5)
WBC: 10.8 10*3/uL — ABNORMAL HIGH (ref 4.0–10.5)

## 2015-03-22 LAB — BASIC METABOLIC PANEL
Anion gap: 10 (ref 5–15)
BUN: 6 mg/dL (ref 6–20)
CALCIUM: 8.8 mg/dL — AB (ref 8.9–10.3)
CHLORIDE: 104 mmol/L (ref 101–111)
CO2: 28 mmol/L (ref 22–32)
CREATININE: 0.61 mg/dL (ref 0.44–1.00)
GFR calc non Af Amer: >60 mL/min (ref >60)
GLUCOSE: 95 mg/dL (ref 65–99)
Potassium: 4.3 mmol/L (ref 3.5–5.1)
Sodium: 142 mmol/L (ref 135–145)

## 2015-03-22 NOTE — ED Provider Notes (Signed)
CSN: [id number]     Arrival date & time 03/22/15  1227 History   First MD Initiated Contact with Patient 03/22/15 1258     Chief Complaint  Patient presents with  . Weakness  . Anxiety     (Consider location/radiation/quality/duration/timing/severity/associated sxs/prior Treatment) HPI Comments: Patient with h/o headaches -- presents with continued bilateral LE weakness withdifficulty walking,  numbness/tingling in the thighs, as well as frequent falls, even with using a walker. Also it feels like she has difficulty swallowing, although is not regurgitating. Her left eye has been drooping on and off. States that she stopped by her neurologist office today after physical therapy appointments and she was told to go to the emergency department. She reports feeling extraordinarily fatigued.  She has been seeing neurologist for her headaches and new symptoms, as well as was hospitalized at [facility] early 03/2015 where she was diagnosed with conversion disorder. She has recently had MRI Brain and complete spine which show foci of white matter changes but no other abnormalities. States that her symptoms wax and wane but have been particularly severe over the past several days. She has followed up with her PCP who is arranging for neurologist follow-up at [facility]. Patient is tearful and states that she can no longer wait for additional appointments and is frustrated that no one can figure out what is wrong.    Patient is a 49 y.o. female presenting with weakness and anxiety. The history is provided by the patient and medical records.  Weakness Associated symptoms include fatigue and weakness. Pertinent negatives include no abdominal pain, chest pain, congestion, coughing, fever, headaches, myalgias, nausea, neck pain, numbness, rash, sore throat or vomiting.  Anxiety Associated symptoms include fatigue and weakness. Pertinent negatives include no abdominal pain, chest pain, congestion, coughing, fever,  headaches, myalgias, nausea, neck pain, numbness, rash, sore throat or vomiting.    Past Medical History  Diagnosis Date  . Other malaise and fatigue   . Routine general medical examination at a health care facility   . Shoulder pain   . Dysmenorrhea   . Left knee pain   . Dysuria   . Hematuria   . Anxiety   . Encounter for long-term (current) use of other medications   . Vitamin D deficiency   . Pure hyperglyceridemia   . Vitamin D deficiency   . Headache   . Migraine   . Depression   . Anemia   . High cholesterol   . ASD (atrial septal defect) 06/11/2014    S/p patch repair  . VSD (ventricular septal defect), multiple 06/11/2014    S/p patch repair  . Atherosclerosis of abdominal aorta (HCC) 06/11/2014    Age advanced atherosclerosis of the infrarenal aorta   Past Surgical History  Procedure Laterality Date  . C seaction    . Vetricular hole    . Cesarean section    . Knee arthroscopy w/ acl reconstruction and hamstring graft Left    Family History  Problem Relation Age of Onset  . Depression Mother   . High blood pressure Mother   . Hyperlipidemia Mother   . Anxiety disorder Mother   . Hypertension Mother   . High blood pressure Father   . Hyperlipidemia Father   . Hypertension Father   . Crohn's disease Father   . Kidney disease Father   . Peripheral vascular disease Father   . Peripheral vascular disease Paternal Grandfather    Social History  Substance Use Topics  . Smoking  status: Former Games developer  . Smokeless tobacco: Never Used  . Alcohol Use: No   OB History    No data available     Review of Systems  Constitutional: Positive for fatigue. Negative for fever.  HENT: Positive for trouble swallowing. Negative for congestion, dental problem, rhinorrhea, sinus pressure and sore throat.   Eyes: Negative for photophobia, discharge, redness and visual disturbance.  Respiratory: Negative for cough and shortness of breath.   Cardiovascular: Negative for  chest pain.  Gastrointestinal: Negative for nausea, vomiting, abdominal pain and diarrhea.  Genitourinary: Negative for dysuria.  Musculoskeletal: Positive for back pain and gait problem. Negative for myalgias, neck pain and neck stiffness.  Skin: Negative for rash.  Neurological: Positive for weakness. Negative for syncope, speech difficulty, light-headedness, numbness and headaches.  Psychiatric/Behavioral: Negative for confusion.    Allergies  Sulfa antibiotics  Home Medications   Prior to Admission medications   Medication Sig Start Date End Date Taking? Authorizing Provider  baclofen (LIORESAL) 10 MG tablet Take 1 tablet (10 mg total) by mouth 3 (three) times daily. Limit use to 1 to 2 days per week 12/20/14   Peyton Najjar, MD  clindamycin (CLINDAGEL) 1 % gel Apply topically 2 (two) times daily. 05/17/14   Peyton Najjar, MD  clonazePAM (KLONOPIN) 0.5 MG tablet Take 1 tablet (0.5 mg total) by mouth 2 (two) times daily as needed for anxiety. 02/25/15   Peyton Najjar, MD  DULoxetine (CYMBALTA) 30 MG capsule Take Cymbalta 30 mg before bedtime 01/04/15   Peyton Najjar, MD  gabapentin (NEURONTIN) 300 MG capsule Take 1 capsule (300 mg total) by mouth 3 (three) times daily. 03/01/15   Levert Feinstein, MD  ibuprofen (ADVIL,MOTRIN) 200 MG tablet Take 200 mg by mouth every 6 (six) hours as needed (take 800 mg PO q6h prn for pain).    Historical Provider, MD  meloxicam (MOBIC) 7.5 MG tablet Take 1-2 tablets (7.5-15 mg total) by mouth daily. 10/02/14   Wallis Bamberg, PA-C  pantoprazole (PROTONIX) 40 MG tablet Take 40 mg by mouth daily.    Historical Provider, MD  promethazine (PHENERGAN) 25 MG tablet Take 25 mg by mouth every 6 (six) hours as needed for nausea or vomiting.    Historical Provider, MD  SUMAtriptan (IMITREX) 50 MG tablet Take 1 tablet (50 mg total) by mouth every 2 (two) hours as needed for migraine. May repeat in 2 hours if headache persists or recurs. 03/01/15   Levert Feinstein, MD   BP 127/74  mmHg  Pulse 91  Temp(Src) 98.5 F (36.9 C) (Oral)  Resp 15  SpO2 97%   Physical Exam  Constitutional: She is oriented to person, place, and time. She appears well-developed and well-nourished.  HENT:  Head: Normocephalic and atraumatic.  Right Ear: Tympanic membrane, external ear and ear canal normal.  Left Ear: Tympanic membrane, external ear and ear canal normal.  Nose: Nose normal.  Mouth/Throat: Uvula is midline, oropharynx is clear and moist and mucous membranes are normal.  Eyes: Conjunctivae, EOM and lids are normal. Pupils are equal, round, and reactive to light. Right eye exhibits no nystagmus. Left eye exhibits no nystagmus.  Neck: Normal range of motion. Neck supple.  Cardiovascular: Normal rate and regular rhythm.   Pulmonary/Chest: Effort normal and breath sounds normal.  Abdominal: Soft. There is no tenderness.  Musculoskeletal:       Cervical back: She exhibits normal range of motion, no tenderness and no bony tenderness.  Neurological: She is alert  and oriented to person, place, and time. She has normal strength and normal reflexes. No cranial nerve deficit or sensory deficit. She displays a negative Romberg sign. Coordination and gait normal. GCS eye subscore is 4. GCS verbal subscore is 5. GCS motor subscore is 6.  Skin: Skin is warm and dry.  Psychiatric: Her affect is labile. She exhibits a depressed mood.  Nursing note and vitals reviewed.   ED Course  Procedures (including critical care time) Labs Review Labs Reviewed  CBC WITH DIFFERENTIAL/PLATELET - Abnormal; Notable for the following:    WBC 10.8 (*)    All other components within normal limits  BASIC METABOLIC PANEL - Abnormal; Notable for the following:    Calcium 8.8 (*)    All other components within normal limits  ACETYLCHOLINE RECEPTOR AB, ALL  ACETYLCHOLINE RECEPTOR, BINDING    Imaging Review No results found. I have personally reviewed and evaluated these images and lab results as part of  my medical decision-making.   EKG Interpretation None       2:00 PM Patient seen and examined. Work-up initiated.   Vital signs reviewed and are as follows: BP 127/74 mmHg  Pulse 91  Temp(Src) 98.5 F (36.9 C) (Oral)  Resp 15  SpO2 97%  3:45 PM Patient discussed with Dr. Hyacinth MeekerMiller. I requested consult with neuro hospitalist who has seen patient.   Reccs are for myasthenia gravis panel, follow-up with local neurologist and [facility] as [facility].      MDM   Final diagnoses:  Weakness   Patient with chronic weakness, subjective neuro deficits, no clear diagnosis at this point. She is currently under the care of neurology. She is understandably frustrated. Tearful but does not voice any SI. Myasthenia panel sent today. F/u with neuro. PCP arranging for psychiatric eval. She has upcoming [facility]nd opinion [facility] scheduled. Appreciate neuro reccs. Will d/c.     Renne CriglerJoshua Maury Bamba, PA-C 03/22/15 1627  Eber HongBrian Miller, MD 03/23/15 2132

## 2015-03-22 NOTE — ED Notes (Signed)
Pt sts started not being able to walk 2 weeks ago; pt sts her eyes are twitching; pt tearful and appears anxious; pt sts "feels like she is going crazy"

## 2015-03-22 NOTE — Consult Note (Signed)
NEURO HOSPITALIST CONSULT NOTE   Requestig physician: Dr. Hyacinth Meeker   Reason for Consult: Bilateral LE weakness  HPI:                                                                                                                                          Joanna Anderson is an 49 y.o. female who is followed closely by Dr. Terrace Arabia.  Patient states she has had 3 weeks of LE weakness that is on and off.  She has had a full neuro axis MRI imaging within the past month which showed no acute changes and no spinal stenosis. ANA, CK, Vit D, CRP all WNL. Patient drove to Dr Catalina Gravel office and requested to be seen.  Patient was not examined and told to go to ED. Patient is currently very tearful and has many complaints including, HA, difficulty swallowing "a feeling she has poison in her body".  She was swallowing her during while talking without difficulty.   Past Medical History  Diagnosis Date  . Other malaise and fatigue   . Routine general medical examination at a health care facility   . Shoulder pain   . Dysmenorrhea   . Left knee pain   . Dysuria   . Hematuria   . Anxiety   . Encounter for long-term (current) use of other medications   . Vitamin D deficiency   . Pure hyperglyceridemia   . Vitamin D deficiency   . Headache   . Migraine   . Depression   . Anemia   . High cholesterol   . ASD (atrial septal defect) 06/11/2014    S/p patch repair  . VSD (ventricular septal defect), multiple 06/11/2014    S/p patch repair  . Atherosclerosis of abdominal aorta (HCC) 06/11/2014    Age advanced atherosclerosis of the infrarenal aorta    Past Surgical History  Procedure Laterality Date  . C seaction    . Vetricular hole    . Cesarean section    . Knee arthroscopy w/ acl reconstruction and hamstring graft Left     Family History  Problem Relation Age of Onset  . Depression Mother   . High blood pressure Mother   . Hyperlipidemia Mother   . Anxiety disorder Mother   .  Hypertension Mother   . High blood pressure Father   . Hyperlipidemia Father   . Hypertension Father   . Crohn's disease Father   . Kidney disease Father   . Peripheral vascular disease Father   . Peripheral vascular disease Paternal Grandfather      Social History:  reports that she has quit smoking. She has never used smokeless tobacco. She reports that she does not drink alcohol or use illicit drugs.  Allergies  Allergen Reactions  .  Sulfa Antibiotics Hives    MEDICATIONS:                                                                                                                     No current facility-administered medications for this encounter.   Current Outpatient Prescriptions  Medication Sig Dispense Refill  . acetaminophen (TYLENOL) 650 MG CR tablet Take 650 mg by mouth every 8 (eight) hours as needed for pain.    . baclofen (LIORESAL) 10 MG tablet Take 1 tablet (10 mg total) by mouth 3 (three) times daily. Limit use to 1 to 2 days per week 60 each 2  . clonazePAM (KLONOPIN) 0.5 MG tablet Take 1 tablet (0.5 mg total) by mouth 2 (two) times daily as needed for anxiety. 60 tablet 0  . gabapentin (NEURONTIN) 300 MG capsule Take 1 capsule (300 mg total) by mouth 3 (three) times daily. 90 capsule 11  . ibuprofen (ADVIL,MOTRIN) 200 MG tablet Take 200 mg by mouth every 6 (six) hours as needed for moderate pain.     . meloxicam (MOBIC) 7.5 MG tablet Take 1-2 tablets (7.5-15 mg total) by mouth daily. (Patient taking differently: Take 7.5 mg by mouth daily as needed for pain. ) 60 tablet 1  . pantoprazole (PROTONIX) 40 MG tablet Take 40 mg by mouth daily.    . promethazine (PHENERGAN) 25 MG tablet Take 25 mg by mouth every 6 (six) hours as needed for nausea or vomiting.    . SUMAtriptan (IMITREX) 50 MG tablet Take 1 tablet (50 mg total) by mouth every 2 (two) hours as needed for migraine. May repeat in 2 hours if headache persists or recurs. 15 tablet 6  . clindamycin (CLINDAGEL) 1  % gel Apply topically 2 (two) times daily. (Patient not taking: Reported on 03/22/2015) 30 g 0  . DULoxetine (CYMBALTA) 30 MG capsule Take Cymbalta 30 mg before bedtime (Patient not taking: Reported on 03/22/2015) 30 capsule 3      ROS:                                                                                                                                       History obtained from the patient  General ROS: negative for - chills, fatigue, fever, night sweats, weight gain or weight loss Psychological ROS: negative for - behavioral disorder, hallucinations, memory difficulties, mood swings or suicidal ideation Ophthalmic ROS: negative for -  blurry vision, double vision, eye pain or loss of vision ENT ROS: negative for - epistaxis, nasal discharge, oral lesions, sore throat, tinnitus or vertigo Allergy and Immunology ROS: negative for - hives or itchy/watery eyes Hematological and Lymphatic ROS: negative for - bleeding problems, bruising or swollen lymph nodes Endocrine ROS: negative for - galactorrhea, hair pattern changes, polydipsia/polyuria or temperature intolerance Respiratory ROS: negative for - cough, hemoptysis, shortness of breath or wheezing Cardiovascular ROS: negative for - chest pain, dyspnea on exertion, edema or irregular heartbeat Gastrointestinal ROS: negative for - abdominal pain, diarrhea, hematemesis, nausea/vomiting or stool incontinence Genito-Urinary ROS: negative for - dysuria, hematuria, incontinence or urinary frequency/urgency Musculoskeletal ROS: negative for - joint swelling or muscular weakness Neurological ROS: as noted in HPI Dermatological ROS: negative for rash and skin lesion changes   Blood pressure 121/58, pulse 95, temperature 98.5 F (36.9 C), temperature source Oral, resp. rate 17, SpO2 98 %.   Neurologic Examination:                                                                                                      HEENT-  Normocephalic,  no lesions, without obvious abnormality.  Normal external eye and conjunctiva.  Normal TM's bilaterally.  Normal auditory canals and external ears. Normal external nose, mucus membranes and septum.  Normal pharynx. Cardiovascular- S1, S2 normal, pulses palpable throughout   Lungs- chest clear, no wheezing, rales, normal symmetric air entry Abdomen- normal findings: bowel sounds normal Extremities- no edema Lymph-no adenopathy palpable Musculoskeletal-no joint tenderness, deformity or swelling Skin-warm and dry, no hyperpigmentation, vitiligo, or suspicious lesions  Neurological Examination Mental Status: Alert, oriented, thought content appropriate.  Speech fluent without evidence of aphasia.  Able to follow 3 step commands without difficulty. Cranial Nerves: II: Discs flat bilaterally; Visual fields grossly normal, pupils equal, round, reactive to light and accommodation III,IV, VI: ptosis not present, extra-ocular motions intact bilaterally V,VII: smile symmetric, facial light touch sensation normal bilaterally VIII: hearing normal bilaterally IX,X: uvula rises symmetrically XI: bilateral shoulder shrug XII: midline tongue extension Motor: Right : Upper extremity   5/5    Left:     Upper extremity   5/5  Lower extremity   5/5     Lower extremity   5/5 Tone and bulk:normal tone throughout; no atrophy noted Sensory: Pinprick and light touch intact throughout, bilaterally Deep Tendon Reflexes: 2+ and symmetric throughout Plantars: Right: downgoing   Left: downgoing Cerebellar: normal finger-to-nose and normal heel-to-shin test Gait: normal gait and station      Lab Results: Basic Metabolic Panel:  Recent Labs Lab 03/22/15 1432  NA 142  K 4.3  CL 104  CO2 28  GLUCOSE 95  BUN 6  CREATININE 0.61  CALCIUM 8.8*    Liver Function Tests: No results for input(s): AST, ALT, ALKPHOS, BILITOT, PROT, ALBUMIN in the last 168 hours. No results for input(s): LIPASE, AMYLASE in  the last 168 hours. No results for input(s): AMMONIA in the last 168 hours.  CBC:  Recent Labs Lab 03/22/15 1432  WBC 10.8*  NEUTROABS 7.2  HGB 13.6  HCT 40.2  MCV 89.7  PLT 195    Cardiac Enzymes: No results for input(s): CKTOTAL, CKMB, CKMBINDEX, TROPONINI in the last 168 hours.  Lipid Panel: No results for input(s): CHOL, TRIG, HDL, CHOLHDL, VLDL, LDLCALC in the last 168 hours.  CBG: No results for input(s): GLUCAP in the last 168 hours.  Microbiology: Results for orders placed or performed in visit on 08/03/14  Urine culture     Status: None   Collection Time: 08/03/14  8:50 PM  Result Value Ref Range Status   Colony Count NO GROWTH  Final   Organism ID, Bacteria NO GROWTH  Final  GC/Chlamydia Probe Amp     Status: None   Collection Time: 08/03/14  9:01 PM  Result Value Ref Range Status   CT Probe RNA NEGATIVE  Final   GC Probe RNA NEGATIVE  Final    Comment:                                                                                         **Normal Reference Range: Negative**         Assay performed using the Gen-Probe APTIMA COMBO2 (R) Assay.   Acceptable specimen types for this assay include APTIMA Swabs (Unisex, endocervical, urethral, or vaginal), first void urine, and ThinPrep liquid based cytology samples.     Coagulation Studies: No results for input(s): LABPROT, INR in the last 72 hours.  Imaging: No results found.     Assessment and plan per attending neurologist  Felicie Morn PA-C [facility] [phone]  03/22/2015, 3:45 PM   Assessment/Plan: 49 YO female with multiple complaints including leg weakness, difficulty swallowing, HA and fear she has poison in her body. Neuro axis imaging non-revealing, Exam non-focal with give way weakness and intact reflexes throughout. At this time would recommend a myasthenia panel and have follow up with Dr. Terrace Arabia.    Neurology S/O   Discussed and agree with above plan  Lenise Herald MD  [phone]

## 2015-03-22 NOTE — ED Notes (Signed)
Patient tearful during assessment. Patient states she was "normal" until about 2 weeks ago when she was unable to walk. Patient states she has Had MRI and see neurologist and no abnormal findings. Patient states, she has HX of migraines and states it started about 2 weeks ago and they have been coming and going. Patient states today she notice some drooping to her left eye. Patient called neurologist and was advised to come here. Patient states ," She feel like she can not get her life in order. " Patient states, " I have been chocking on my pills and food lately but it seems like no one cares" " I fee like everyone thinks I am crazy, like I am making this up".

## 2015-03-22 NOTE — Telephone Encounter (Signed)
OV notes and FMLA forms faxed over to [facility] on 03/22/15.

## 2015-03-22 NOTE — Discharge Instructions (Signed)
Please read and follow all provided instructions.  Your diagnoses today include:  1. Weakness    Tests performed today include:  Blood counts and electrolytes - no problems  Myasthenia Gravis testing - will take several days to return, follow-up with your neurologist for results  Vital signs. See below for your results today.   Medications prescribed:   None  Take any prescribed medications only as directed.  Home care instructions:  Follow any educational materials contained in this packet.  BE VERY CAREFUL not to take multiple medicines containing Tylenol (also called acetaminophen). Doing so can lead to an overdose which can damage your liver and cause liver failure and possibly death.   Follow-up instructions: Please follow-up with your primary care provider/neurologist in the next 7 days for further evaluation of your symptoms. Please keep your second opinion appointment with [facility].   Return instructions:   Please return to the Emergency Department if you experience worsening symptoms.   Please return if you have any other emergent concerns.  Additional Information:  Your vital signs today were: BP 139/87 mmHg   Pulse 95   Temp(Src) 98.5 F (36.9 C) (Oral)   Resp 15   SpO2 96% If your blood pressure (BP) was elevated above 135/85 this visit, please have this repeated by your doctor within one month. --------------

## 2015-03-22 NOTE — ED Notes (Signed)
Neurology at the bedside

## 2015-03-22 NOTE — ED Notes (Addendum)
Pt very tearful upon entry.

## 2015-03-23 ENCOUNTER — Ambulatory Visit (INDEPENDENT_AMBULATORY_CARE_PROVIDER_SITE_OTHER): Payer: 59 | Admitting: Family Medicine

## 2015-03-23 ENCOUNTER — Telehealth: Payer: Self-pay

## 2015-03-23 VITALS — BP 126/84 | HR 99 | Temp 98.8°F | Resp 18

## 2015-03-23 DIAGNOSIS — G43809 Other migraine, not intractable, without status migrainosus: Secondary | ICD-10-CM | POA: Diagnosis not present

## 2015-03-23 DIAGNOSIS — F329 Major depressive disorder, single episode, unspecified: Secondary | ICD-10-CM

## 2015-03-23 DIAGNOSIS — F32A Depression, unspecified: Secondary | ICD-10-CM

## 2015-03-23 DIAGNOSIS — R29898 Other symptoms and signs involving the musculoskeletal system: Secondary | ICD-10-CM

## 2015-03-23 LAB — ACETYLCHOLINE RECEPTOR, BINDING

## 2015-03-23 NOTE — Telephone Encounter (Signed)
Pt states she just got out of the hospital yesterday is will be coming in to see Dr Dareen PianoAnderson

## 2015-03-23 NOTE — Patient Instructions (Signed)
Forms have been completed for you  Continue trying to be as active as she can, using your walker as needed and taking caution to avoid falls  Plan to return in one month, sooner if further concerns  Follow-up with the psychiatrist phone call and see if you can get in for your appointment.  We are waiting to hear back from the second opinion neurology referral.

## 2015-03-23 NOTE — Progress Notes (Signed)
Patient ID: Marcille BuffyDeborah S Telleria, female    DOB: 12-12-1965  Age: 49 y.o. MRN: [id number]  Chief Complaint  Patient presents with  . Follow-up    for neuro symptoms: face drooping and weakness of legs    Subjective:   Patient is very stressed. She has felt like she has had some drooping of her left eyelid. She had taken a picture of herself and it looked different than a picture she remembered. She went to the neurologist office and they told her to go to the emergency room. She ended up being seen in the ER by a PA and then by a neurologist, whose name she does not recall. He ordered some acetylcholine receptor antibody tests. She is worried that she has something bad but so nobody can find anything bad. She has been told that she could have a conversion reaction and anxiety induced condition. She said the psychiatrist who called yesterday but she did not call back. She still doesn't have an appointment with psychiatrist. I told her is very important that she make the appointment. She talked for a long time, 20-30 minutes, airing her frustrations.  Current allergies, medications, problem list, past/family and social histories reviewed.  Objective:  BP 126/84 mmHg  Pulse 99  Temp(Src) 98.8 F (37.1 C) (Oral)  Resp 18  SpO2 98%  Is able to extend her legs when sitting on the table. She is able to lift both thighs off the table. Gait is a little weak and wobbly, but then when she walked down the hall leaving it was fairly smooth. She is using the walker constantly.  Eyelids look symmetrical to me as her PERRLA. EOMs intact.  The first of the acetylcholinesterase tests, and the binding antibody, was normal. I told her that. I will see what the other test comes back also, but that does not appear to be consistent with mono of myasthenia gravis.  Assessment & Plan:   Assessment: 1. Leg weakness, bilateral   2. Other migraine without status migrainosus, not intractable   3. Depression        Plan: See instructions. It is very important for her to go ahead and follow-up with the neurologist and psychiatrist. We are awaiting hearing from [facility], but it looks like that will be a long process. Advised her to try and be active and build strength the best she can. I do not think she can work yet, and filled out form for her. She also is going to need something for FMLA next week apparently.       Patient Instructions  Forms have been completed for you  Continue trying to be as active as she can, using your walker as needed and taking caution to avoid falls  Plan to return in one month, sooner if further concerns  Follow-up with the psychiatrist phone call and see if you can get in for your appointment.  We are waiting to hear back from the second opinion neurology referral.    Return in about 4 weeks (around 04/20/2015).   Shammond Arave, MD 03/23/2015

## 2015-03-28 ENCOUNTER — Encounter: Payer: Self-pay | Admitting: Physical Therapy

## 2015-03-28 NOTE — Therapy (Signed)
[facility] [address] [place], [place], [address] Phone: [phone]   Fax:  [phone]  Physical Therapy Evaluation  Patient Details  Name: Joanna Anderson MRN: [id number] Date of Birth: Apr 13, 1965 Referring Provider: Dr. Merlinda FrederickJodi Anderson  Encounter Date: 03/22/2015      PT End of Session - 03/28/15 1842    Visit Number 1   Number of Visits 9   Date for PT Re-Evaluation 04/22/15   Authorization Type [place] [employer]   PT Start Time 1016   PT Stop Time 1100   PT Time Calculation (min) 44 min      Past Medical History  Diagnosis Date  . Other malaise and fatigue   . Routine general medical examination at a health care facility   . Shoulder pain   . Dysmenorrhea   . Left knee pain   . Dysuria   . Hematuria   . Anxiety   . Encounter for long-term (current) use of other medications   . Vitamin D deficiency   . Pure hyperglyceridemia   . Vitamin D deficiency   . Headache   . Migraine   . Depression   . Anemia   . High cholesterol   . ASD (atrial septal defect) 06/11/2014    S/p patch repair  . VSD (ventricular septal defect), multiple 06/11/2014    S/p patch repair  . Atherosclerosis of abdominal aorta (HCC) 06/11/2014    Age advanced atherosclerosis of the infrarenal aorta    Past Surgical History  Procedure Laterality Date  . C seaction    . Vetricular hole    . Cesarean section    . Knee arthroscopy w/ acl reconstruction and hamstring graft Left     There were no vitals filed for this visit.  Visit Diagnosis:  Bilateral leg weakness - Plan: PT plan of care cert/re-cert  Abnormality of gait - Plan: PT plan of care cert/re-cert      Subjective Assessment - 03/28/15 1832    Subjective Pt reports she awoke on Wed., 03-02-15 with inability to stand/ambulate; had been seeing neurologist for migraines; called neurologist on that Wed., 02-3015 and she stated it was stress response; [facility]  [facility] and they  diagnosed it as conversion disorder;   Pertinent History migraine headaches, fibromyalgia? - not definitely diagnosed with this by rheumatologist   Diagnostic tests MRI done 02-22-15 ([facility] cervical spine) and [facility]  = states MRI's are not normal   Patient Stated Goals Be able to walk and return to work   Currently in Pain? No/denies            [facility] PT Assessment - 03/28/15 0001    Assessment   Medical Diagnosis Gait Disturbance   Referring Provider Dr. Merlinda FrederickJodi Anderson   Onset Date/Surgical Date 02/22/15  R leg "was not working" when went to MD appt.   Balance Screen   Has the patient fallen in the past 6 months Yes   How many times? 20+   Has the patient had a decrease in activity level because of a fear of falling?  Yes   Is the patient reluctant to leave their home because of a fear of falling?  Yes   Home Environment   Living Environment Private residence   Type of Home House   Home Access Stairs to enter   Entrance Stairs-Number of Steps 2   Ambulation/Gait   Ambulation/Gait Yes   Ambulation/Gait  Assistance 5: Supervision   Ambulation/Gait Assistance Details incr. UE weight bearing due weakness in bil. LE's   Ambulation Distance (Feet) 50 Feet   Assistive device Rolling walker   Gait Pattern Decreased step length - right;Decreased step length - left   Ambulation Surface Level;Indoor   High Level Balance   High Level Balance Comments pt unable to stand unsupported                                PT Long Term Goals - 03/28/15 1850    PT LONG TERM GOAL #1   Title Pt will amb. household distances without use of RW.  (04-22-15)   Time 4   Period Weeks   Status New   PT LONG TERM GOAL #2   Title Assess TUG and establish goal as appropriate.  (04-22-15)   Time 4   Period Weeks   Status New   PT LONG TERM GOAL #3   Title Negotiate steps with 1 rail using a step over step sequence.  (04-22-15)   Time 4   Period  Weeks   Status New   PT LONG TERM GOAL #4   Title Improve Berg score by at least 8 points for improved balance/decr. fall risk.  (04-22-15)   Time 4   Period Weeks   Status New   PT LONG TERM GOAL #5   Title Independent in HEP for LE strengthening exercises.  (04-22-15)   Time 4   Period Weeks   Status New               Plan - 03/28/15 1844    Clinical Impression Statement Pt presents with LE weakness which pt states varies in severity; pt expressess frustration at lack of diagnosis with LE weakness and generalized fatigue; pt states she does not feel that weakness is due to conversion disorder -  feels that something else is going on   Pt will benefit from skilled therapeutic intervention in order to improve on the following deficits Abnormal gait;Decreased activity tolerance;Decreased balance;Decreased mobility;Decreased strength;Decreased coordination;Decreased endurance   Rehab Potential Good   PT Frequency 2x / week   PT Duration 4 weeks   PT Treatment/Interventions ADLs/Self Care Home Management;Gait training;Stair training;Functional mobility training;Therapeutic activities;Therapeutic exercise;Patient/family education;Neuromuscular re-education;Balance training   PT Next Visit Plan do Berg balance test; TUG and gait velocity   Consulted and Agree with Plan of Care Patient         Problem List Patient Active Problem List   Diagnosis Date Noted  . Neck pain 02/22/2015  . Abnormality of gait 02/22/2015  . Pelvic pain in female 10/02/2014  . Chest pain 06/11/2014  . ASD (atrial septal defect) 06/11/2014  . VSD (ventricular septal defect), multiple 06/11/2014  . Atherosclerosis of abdominal aorta (HCC) 06/11/2014  . Other malaise and fatigue   . Shoulder pain   . Dysmenorrhea   . Dysuria   . Hematuria   . Anxiety   . Encounter for long-term (current) use of other medications   . Vitamin D deficiency   . Pure hyperglyceridemia   . Migraine   . Depression      Jakala Herford, Donavan Burnet, PT 03/28/2015, 7:02 PM  [facility] [address] [place], [place], [address] Phone: [phone]   Fax:  [phone]  Name: Joanna Anderson MRN: [id number] Date of Birth: [birth year]

## 2015-03-29 ENCOUNTER — Telehealth: Payer: Self-pay | Admitting: Neurology

## 2015-03-29 DIAGNOSIS — R269 Unspecified abnormalities of gait and mobility: Secondary | ICD-10-CM

## 2015-03-29 NOTE — Telephone Encounter (Signed)
I called the patient and gave her the results. She questioned if she had a full Myasthenia Gravis panel. I confirmed with Dr. Terrace ArabiaYan that the patient had the full panel. Dr. Terrace ArabiaYan advised that we will need to refer the [facility] to [facility] for her [facility]. The patient [facility] to [facility]. She would like to keep Dr. Terrace ArabiaYan as her neurologist for her migraines though. Dr. Terrace ArabiaYan agreed to this.

## 2015-03-29 NOTE — Telephone Encounter (Signed)
Please call patient, laboratory in March 22 2015 reviewed, acetylcholine receptor antibody was negative, mild elevated WBC 10 point 8, normal BMP

## 2015-03-29 NOTE — Addendum Note (Signed)
Addended by: Levert FeinsteinYAN, Bryannah Boston on: 03/29/2015 04:46 PM   Modules accepted: Orders

## 2015-03-29 NOTE — Telephone Encounter (Signed)
Patient is calling and would like to know the results of her blood work.  Pleae call.

## 2015-03-29 NOTE — Addendum Note (Signed)
Addended by: Levert FeinsteinYAN, Nashya Garlington on: 03/29/2015 04:45 PM   Modules accepted: Orders

## 2015-03-30 ENCOUNTER — Encounter: Payer: Self-pay | Admitting: Physical Therapy

## 2015-03-30 ENCOUNTER — Ambulatory Visit: Payer: 59 | Admitting: Physical Therapy

## 2015-03-30 DIAGNOSIS — R29898 Other symptoms and signs involving the musculoskeletal system: Secondary | ICD-10-CM | POA: Diagnosis not present

## 2015-03-30 DIAGNOSIS — R269 Unspecified abnormalities of gait and mobility: Secondary | ICD-10-CM

## 2015-03-31 ENCOUNTER — Ambulatory Visit: Payer: 59 | Admitting: Occupational Therapy

## 2015-03-31 ENCOUNTER — Ambulatory Visit (INDEPENDENT_AMBULATORY_CARE_PROVIDER_SITE_OTHER): Payer: 59 | Admitting: Family Medicine

## 2015-03-31 VITALS — BP 112/82 | HR 101 | Temp 99.0°F | Resp 18 | Ht 66.0 in

## 2015-03-31 DIAGNOSIS — F32A Depression, unspecified: Secondary | ICD-10-CM

## 2015-03-31 DIAGNOSIS — F329 Major depressive disorder, single episode, unspecified: Secondary | ICD-10-CM | POA: Diagnosis not present

## 2015-03-31 DIAGNOSIS — F411 Generalized anxiety disorder: Secondary | ICD-10-CM | POA: Diagnosis not present

## 2015-03-31 DIAGNOSIS — R29898 Other symptoms and signs involving the musculoskeletal system: Secondary | ICD-10-CM

## 2015-03-31 DIAGNOSIS — G43909 Migraine, unspecified, not intractable, without status migrainosus: Secondary | ICD-10-CM

## 2015-03-31 LAB — ACETYLCHOLINE RECEPTOR AB, ALL
Acetylchol Block Ab: 19 % (ref 0–25)
Acetylcholine Modulat Ab: <12 % (ref 0–20)

## 2015-03-31 MED ORDER — CLONAZEPAM 0.5 MG PO TABS: 0.5000 mg | ORAL_TABLET | Freq: Two times a day (BID) | ORAL | Status: DC | PRN

## 2015-03-31 NOTE — Progress Notes (Signed)
Patient ID: Joanna Anderson, female    DOB: [birth year]  Age: 49 y.o. MRN: [id number]  Chief Complaint  Patient presents with  . Fatigue  . Trouble with PT    not doing too well with PT  . panic attack  . muscle spasms  . Depression    unable to get screening was too emotional  . slurred speech  . Medication Refill    protonix    Subjective:   Patient returns today a little sooner than we had discussed. She is continuing to have a lot of troubles. She needs another form filled out for [employer]. She says she is going to need more stuff for the FMLA also. Does not have any papers but wants a letter. She continues to feel like she has a great deal of trouble walking. Her left leg has more weakness than the right, and she is able to shuffle along with her walker. She can take a few steps without it. Her family is apparently embarrassed about her using the walker, and this is very stressful to her. She is worried she will never get back to where she was before. Her myasthenia tests were all negative, and the neurologist is already on to [facility]. 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 She wants to know when she can get back to work, and as long as she is like this have told her I have no insurance as to when that will be.  Current allergies, medications, problem list, past/family and social histories reviewed.  Objective:  BP 112/82 mmHg  Pulse 101  Temp(Src) 99 F (37.2 C) (Oral)  Resp 18  Ht  (1.676 m)  SpO2 96%  Upper body strength seems fairly good though she says she has problems various places. She describes a almost fasciculations of the muscles of her abdomen. She says her legs are getting weaker. She is able to walk in about the same fashion using her walker. She has had another fall at home today.  Assessment & Plan:   Assessment: 1. Leg weakness, bilateral   2. Depression   3. Anxiety state   4. Migraine without status migrainosus, not intractable, unspecified  migraine type       Plan: I still have nothing else to offer her at this time. I think she needs to be on a antidepressant, but told her I would like her to see a psychiatrist first. She apparently may be able to get into somewhere as soon as Tuesday, [facility]. I think it will be at least a month, probably 2 months before she is able to go back to work. She needs continue to stay as active as possible.  No orders of the defined types were placed in this encounter.    Meds ordered this encounter  Medications  . clonazePAM (KLONOPIN) 0.5 MG tablet    Sig: Take 1 tablet (0.5 mg total) by mouth 2 (two) times daily as needed for anxiety.    Dispense:  60 tablet    Refill:  0    See patient before further refills         Patient Instructions  Keep appointment when they make it for psychiatry and neurology.  Return in 2 weeks. On Monday or Tuesday.  I don't expect you to return to work for at least one month, possibly 2. We will give her an excuse note for one month at this time.  Continue trying to strengthen yourself best you can  with staying as active as possible.     Return in about 2 weeks (around 04/14/2015).   Akito Boomhower, MD 03/31/2015

## 2015-03-31 NOTE — Therapy (Signed)
[facility] [address] [place], [place], [address] Phone: [phone]   Fax:  [phone]  Physical Therapy Treatment  Patient Details  Name: Joanna Anderson MRN: [id number] Date of Birth: [birth year] Referring Provider: Dr. Merlinda FrederickJodi Hawes  Encounter Date: 03/30/2015      PT End of Session - 03/31/15 1548    Visit Number 2   Number of Visits 9   Date for PT Re-Evaluation 04/22/15   Authorization Type [facility]   PT Start Time [phone]   PT Stop Time 0848   PT Time Calculation (min) 32 min      Past Medical History  Diagnosis Date  . Other malaise and fatigue   . Routine general medical examination at a health care facility   . Shoulder pain   . Dysmenorrhea   . Left knee pain   . Dysuria   . Hematuria   . Anxiety   . Encounter for long-term (current) use of other medications   . Vitamin D deficiency   . Pure hyperglyceridemia   . Vitamin D deficiency   . Headache   . Migraine   . Depression   . Anemia   . High cholesterol   . ASD (atrial septal defect) 06/11/2014    S/p patch repair  . VSD (ventricular septal defect), multiple 06/11/2014    S/p patch repair  . Atherosclerosis of abdominal aorta (HCC) 06/11/2014    Age advanced atherosclerosis of the infrarenal aorta    Past Surgical History  Procedure Laterality Date  . C seaction    . Vetricular hole    . Cesarean section    . Knee arthroscopy w/ acl reconstruction and hamstring graft Left     There were no vitals filed for this visit.  Visit Diagnosis:  Abnormality of gait  Bilateral leg weakness   Self care; discussed functional status and fluctuations in status; pt states Dr. Terrace ArabiaYan has [facility];  Dr. Alwyn RenHopper has [facility] which she states may be April - May before she is able to be seen;  Discussed that plan in PT would be to treat her symptoms - weakness and gait difficulty -   Recommended pt attempt sit to stand exercise at home with minimal UE support if able to attempt this and to perform heel raises 10 reps in standing Pt verbalized understanding                                 PT Long Term Goals - 03/28/15 1850    PT LONG TERM GOAL #1   Title Pt will amb. household distances without use of RW.  (04-22-15)   Time 4   Period Weeks   Status New   PT LONG TERM GOAL #2   Title Assess TUG and establish goal as appropriate.  (04-22-15)   Time 4   Period Weeks   Status New   PT LONG TERM GOAL #3   Title Negotiate steps with 1 rail using a step over step sequence.  (04-22-15)   Time 4   Period Weeks   Status New   PT LONG TERM GOAL #4   Title Improve Berg score by at least 8 points for improved balance/decr. fall risk.  (04-22-15)   Time 4   Period Weeks   Status New   PT LONG TERM GOAL #5  Title Independent in HEP for LE strengthening exercises.  (04-22-15)   Time 4   Period Weeks   Status New               Plan - 03/31/15 1550    Clinical Impression Statement Pt appears very anxious and is very talkative regarding symptoms and her current functional status and fluctuations  - states that she is able to amb. without walker at times but has great difficulty at other times; states that myasthenia gravis panel was negative  so she is going"with the conversion disorder diagnosis"   Pt will benefit from skilled therapeutic intervention in order to improve on the following deficits Abnormal gait;Decreased activity tolerance;Decreased balance;Decreased mobility;Decreased strength;Decreased coordination;Decreased endurance   Rehab Potential Good   PT Frequency 2x / week   PT Duration 4 weeks   PT Treatment/Interventions ADLs/Self Care Home Management;Gait training;Stair training;Functional mobility training;Therapeutic activities;Therapeutic exercise;Patient/family education;Neuromuscular re-education;Balance training   PT Next  Visit Plan begin HEP   Consulted and Agree with Plan of Care Patient        Problem List Patient Active Problem List   Diagnosis Date Noted  . Neck pain 02/22/2015  . Abnormality of gait 02/22/2015  . Pelvic pain in female 10/02/2014  . Chest pain 06/11/2014  . ASD (atrial septal defect) 06/11/2014  . VSD (ventricular septal defect), multiple 06/11/2014  . Atherosclerosis of abdominal aorta (HCC) 06/11/2014  . Other malaise and fatigue   . Shoulder pain   . Dysmenorrhea   . Dysuria   . Hematuria   . Anxiety   . Encounter for long-term (current) use of other medications   . Vitamin D deficiency   . Pure hyperglyceridemia   . Migraine   . Depression     Marshawn Normoyle, Donavan Burnet, PT 03/31/2015, 3:55 PM  [facility] [address] [place], [place], [address] Phone: [phone]   Fax:  [phone]  Name: Joanna Anderson MRN: [id number] Date of Birth: 12-10-1965

## 2015-03-31 NOTE — Patient Instructions (Addendum)
Keep appointment when they make it for psychiatry and neurology.  Return in 2 weeks. On Monday or Tuesday.  I don't expect you to return to work for at least one month, possibly 2. We will give her an excuse note for one month at this time.  Continue trying to strengthen yourself best you can with staying as active as possible.

## 2015-04-04 ENCOUNTER — Telehealth: Payer: Self-pay

## 2015-04-04 ENCOUNTER — Other Ambulatory Visit: Payer: Self-pay | Admitting: Family Medicine

## 2015-04-04 DIAGNOSIS — M79662 Pain in left lower leg: Secondary | ICD-10-CM

## 2015-04-04 DIAGNOSIS — M79661 Pain in right lower leg: Secondary | ICD-10-CM

## 2015-04-04 MED ORDER — TRAMADOL HCL 50 MG PO TABS: ORAL_TABLET | ORAL | Status: DC

## 2015-04-04 NOTE — Telephone Encounter (Signed)
Pt was seen here on 12/29 by Dr. Alwyn RenHopper for Leg weakness, bilateral - Primary. She feels terrible and she can't do much because her muscles are giving out. She would like to know what to do. She would like to be prescribed Tramadol. CB # [phone]

## 2015-04-04 NOTE — Progress Notes (Signed)
Hurting a lot.  Will give a few tramadol.

## 2015-04-04 NOTE — Telephone Encounter (Signed)
Pt.notified

## 2015-04-04 NOTE — Telephone Encounter (Signed)
Spoke with pt, advised her Rx ready to pick up. She would like to know if she can get a temporary handicap sticker because she is exhausted from walking when she is out doing daily things. Please advise.

## 2015-04-04 NOTE — Telephone Encounter (Signed)
I am leaving a tramadol rx at the front.

## 2015-04-05 MED FILL — traMADol HCL 50 MG TABS: 50 | 10 days supply | Qty: 30 | Fill #0

## 2015-04-05 MED FILL — GABAPENTIN 300 MG CAPSULE: 300 | 30 days supply | Qty: 90 | Fill #0

## 2015-04-05 NOTE — Telephone Encounter (Signed)
Yes.  Please fill out a handicapped form for me to sign.

## 2015-04-07 NOTE — Telephone Encounter (Signed)
Joanna Anderson is going to put in your box.

## 2015-04-11 ENCOUNTER — Ambulatory Visit: Payer: 59 | Admitting: Physical Therapy

## 2015-04-12 ENCOUNTER — Ambulatory Visit: Payer: 59 | Attending: Psychiatry | Admitting: Physical Therapy

## 2015-04-12 ENCOUNTER — Encounter: Payer: Self-pay | Admitting: Physical Therapy

## 2015-04-12 ENCOUNTER — Ambulatory Visit (INDEPENDENT_AMBULATORY_CARE_PROVIDER_SITE_OTHER): Payer: 59 | Admitting: Family Medicine

## 2015-04-12 VITALS — BP 120/70 | HR 98 | Temp 99.0°F | Resp 18 | Ht 66.0 in | Wt 169.6 lb

## 2015-04-12 DIAGNOSIS — R531 Weakness: Secondary | ICD-10-CM | POA: Insufficient documentation

## 2015-04-12 DIAGNOSIS — R279 Unspecified lack of coordination: Secondary | ICD-10-CM | POA: Insufficient documentation

## 2015-04-12 DIAGNOSIS — F329 Major depressive disorder, single episode, unspecified: Secondary | ICD-10-CM

## 2015-04-12 DIAGNOSIS — F32A Depression, unspecified: Secondary | ICD-10-CM

## 2015-04-12 DIAGNOSIS — R269 Unspecified abnormalities of gait and mobility: Secondary | ICD-10-CM | POA: Diagnosis not present

## 2015-04-12 DIAGNOSIS — M79601 Pain in right arm: Secondary | ICD-10-CM | POA: Insufficient documentation

## 2015-04-12 DIAGNOSIS — M79602 Pain in left arm: Secondary | ICD-10-CM | POA: Insufficient documentation

## 2015-04-12 DIAGNOSIS — R29898 Other symptoms and signs involving the musculoskeletal system: Secondary | ICD-10-CM

## 2015-04-12 NOTE — Therapy (Signed)
[facility] [address] [place], [place], [address] Phone: [phone]   Fax:  [phone]  Physical Therapy Treatment  Patient Details  Name: Joanna Anderson MRN: [id number] Date of Birth: 06-Apr-1965 Referring Provider: Dr. Merlinda Frederick  Encounter Date: 04/12/2015      PT End of Session - 04/12/15 2105    Visit Number 3   Date for PT Re-Evaluation 04/22/15   Authorization Type [place] [employer]   PT Start Time 1420   PT Stop Time 1450   PT Time Calculation (min) 30 min      Past Medical History  Diagnosis Date  . Other malaise and fatigue   . Routine general medical examination at a health care facility   . Shoulder pain   . Dysmenorrhea   . Left knee pain   . Dysuria   . Hematuria   . Anxiety   . Encounter for long-term (current) use of other medications   . Vitamin D deficiency   . Pure hyperglyceridemia   . Vitamin D deficiency   . Headache   . Migraine   . Depression   . Anemia   . High cholesterol   . ASD (atrial septal defect) 06/11/2014    S/p patch repair  . VSD (ventricular septal defect), multiple 06/11/2014    S/p patch repair  . Atherosclerosis of abdominal aorta (HCC) 06/11/2014    Age advanced atherosclerosis of the infrarenal aorta    Past Surgical History  Procedure Laterality Date  . C seaction    . Vetricular hole    . Cesarean section    . Knee arthroscopy w/ acl reconstruction and hamstring graft Left     There were no vitals filed for this visit.  Visit Diagnosis:  Abnormality of gait  Bilateral leg weakness      Subjective Assessment - 04/12/15 2049    Subjective Pt states she purchased rollator that she is using- states it is alot better than the aluminum RW which she had before; still doesn't know etiology of weakness   Pertinent History migraine headaches, fibromyalgia? - not definitely diagnosed with this by rheumatologist   Patient Stated Goals Be able to walk and  return to work   Currently in Pain? No/denies                         [facility] Adult PT Treatment/Exercise - 04/12/15 0001    Exercises   Exercises Knee/Hip   Lumbar Exercises: Machines for Strengthening   Leg Press 45# bil. LE's 3 sets 10 reps   Knee/Hip Exercises: Standing   Heel Raises 1 set;10 reps   Other Standing Knee Exercises standing hip extension, abduction with red theraband x 10 reps each LE     step ups R and LLE 10 reps each at steps (6")  Nustep 5" level 3 with LE's primarily, with minimal UE assist on handles                PT Long Term Goals - 03/28/15 1850    PT LONG TERM GOAL #1   Title Pt will amb. household distances without use of RW.  (04-22-15)   Time 4   Period Weeks   Status New   PT LONG TERM GOAL #2   Title Assess TUG and establish goal as appropriate.  (04-22-15)   Time 4   Period Weeks   Status New   PT LONG TERM GOAL #3  Title Negotiate steps with 1 rail using a step over step sequence.  (04-22-15)   Time 4   Period Weeks   Status New   PT LONG TERM GOAL #4   Title Improve Berg score by at least 8 points for improved balance/decr. fall risk.  (04-22-15)   Time 4   Period Weeks   Status New   PT LONG TERM GOAL #5   Title Independent in HEP for LE strengthening exercises.  (04-22-15)   Time 4   Period Weeks   Status New               Plan - 04/12/15 2108    Clinical Impression Statement Weakness in bil. LE's remains of unknown etiology - R knee buckling at times resulting in instability; pt reports having had a fall at home within past few days   Pt will benefit from skilled therapeutic intervention in order to improve on the following deficits Abnormal gait;Decreased activity tolerance;Decreased balance;Decreased mobility;Decreased strength;Decreased coordination;Decreased endurance   Rehab Potential Good   PT Frequency 2x / week   PT Duration 4 weeks   PT Treatment/Interventions ADLs/Self Care Home  Management;Gait training;Stair training;Functional mobility training;Therapeutic activities;Therapeutic exercise;Patient/family education;Neuromuscular re-education;Balance training   PT Next Visit Plan cont ther ex and gait   PT Home Exercise Plan strengthening exercises for bil. LE's   Consulted and Agree with Plan of Care Patient        Problem List Patient Active Problem List   Diagnosis Date Noted  . Neck pain 02/22/2015  . Abnormality of gait 02/22/2015  . Pelvic pain in female 10/02/2014  . Chest pain 06/11/2014  . ASD (atrial septal defect) 06/11/2014  . VSD (ventricular septal defect), multiple 06/11/2014  . Atherosclerosis of abdominal aorta (HCC) 06/11/2014  . Other malaise and fatigue   . Shoulder pain   . Dysmenorrhea   . Dysuria   . Hematuria   . Anxiety   . Encounter for long-term (current) use of other medications   . Vitamin D deficiency   . Pure hyperglyceridemia   . Migraine   . Depression     Sharief Wainwright, Donavan BurnetLinda Suzanne, PT 04/12/2015, 9:27 PM  [facility] [address] [place], [place], [address] Phone: [phone]   Fax:  [phone]  Name: Joanna Anderson MRN: [id number] Date of Birth: February 07, 1966

## 2015-04-12 NOTE — Progress Notes (Signed)
Patient ID: Joanna Anderson, female    DOB: 1965-08-05  Age: 50 y.o. MRN: [id number]  Chief Complaint  Patient presents with  . Follow-up    leg problem    Subjective:   Patient continues to be weak in her legs, and this had time of falling. She has pain in her right shoulder. Some of that may come from falling. She is concerned because that is seen to be weaker when she tries to lift her kitchen equipment from the pantry to the kitchen. She has gotten a good quality we'll walker, which has helped her a lot the last few days. She has been going to the aquatic center and doing some water aerobics. She also is been getting physical therapy, with seeing them today. She is signed up to get occupational therapy also in order to get more working with her upper body.  She still has not gotten an appointment with psychiatry. The [place] network told her it would be April before she got in. The other clinic that she went to apparently he had lost track of having seen her. She says she has to go back to the intake process again there.  Dr. Terrace Arabia had referred her to [facility] neurology, requesting that she be seen as soon as possible. Patient talked to the people at [facility] and that also sounded like it would be April before she could get seen. If that is to be accelerated that all apparently a call is needed from her referring neurologist, so I am communicating with Dr. Terrace Arabia about that.  She feels fatigued, thought she might have a viral bug. Not a lot of other symptoms.  She continues to be depressed, wishing that she could get back to life as usual and back into her job. Things are stressful at home with her feeling like her family is not very understanding of all this.  She was also concerned that she had one episode of the pupil being asymmetrically dilated to one side.  Objective:  BP 120/70 mmHg  Pulse 98  Temp(Src) 99 F (37.2 C) (Oral)  Resp 18  Ht 5\' 6"  (1.676 m)  Wt 169 lb 9.6 oz (76.93 kg)  BMI  27.39 kg/m2  SpO2 97%  She looks about the same as always. Good range of motion of the upper body. Strength seems adequate in her shoulders. The gait is very much the same as it was last time, with the sensation that the right knee is going to give way and feeling tight by the left knee. She is able to walk and pivot adequately with the walker.  Assessment & Plan:   Assessment: 1. Leg weakness, bilateral   2. Depression   3. Shoulder weakness       Plan: I do not feel like I am able to help sort out her issues at this time, and still feel it is very important for her to see a neurologist to assess his symptoms and a psychiatrist to sort through these issues. I accidentally both called in and gave a written prescription for tramadol previously, so she has an unfilled prescription that she can get filled about 10 days from now. She is taking under 2 pills a day, and I urged her to minimize their use.  There is a strong psychological overlay of whatever is causing her weakness. Certainly specialist need to sort this out for her.   Patient Instructions  It is still very important that you pursue the other  appointments.  Use the tramadol when needed, trying to keep it under 2 pills daily.  Continue working on the therapy to keep yourself strong.  Return in 3-4 weeks.       Return in about 4 weeks (around 05/10/2015).   HOPPER,DAVID, MD 04/12/2015

## 2015-04-12 NOTE — Patient Instructions (Addendum)
It is still very important that you pursue the other appointments.  Use the tramadol when needed, trying to keep it under 2 pills daily.  Continue working on the therapy to keep yourself strong.  Return in 3-4 weeks.  Do not get the other tramadol prescription filled until at least 10 days or so from now.

## 2015-04-13 ENCOUNTER — Telehealth: Payer: Self-pay | Admitting: Neurology

## 2015-04-13 NOTE — Telephone Encounter (Signed)
[facility] from 07/06/15 to 06/08/15 with Dr. Sophronia Simasemarchena at 2:30 (arrive 2:15pm) - she is aware of appt change. Dr. Terrace ArabiaYan has offered to do a NCV/EMG test - she is scheduled 04/19/15.

## 2015-04-13 NOTE — Telephone Encounter (Signed)
Patient is calling to discuss the letter sent to us from Dr. Janace Hoardavid Hopper regarding [facility]. Please call. Thank you.

## 2015-04-14 ENCOUNTER — Ambulatory Visit: Payer: 59 | Admitting: Physical Therapy

## 2015-04-14 DIAGNOSIS — R269 Unspecified abnormalities of gait and mobility: Secondary | ICD-10-CM | POA: Diagnosis not present

## 2015-04-14 DIAGNOSIS — R29898 Other symptoms and signs involving the musculoskeletal system: Secondary | ICD-10-CM

## 2015-04-14 DIAGNOSIS — R531 Weakness: Secondary | ICD-10-CM | POA: Diagnosis not present

## 2015-04-14 DIAGNOSIS — M79602 Pain in left arm: Secondary | ICD-10-CM | POA: Diagnosis not present

## 2015-04-14 DIAGNOSIS — R279 Unspecified lack of coordination: Secondary | ICD-10-CM | POA: Diagnosis not present

## 2015-04-14 DIAGNOSIS — M79601 Pain in right arm: Secondary | ICD-10-CM | POA: Diagnosis not present

## 2015-04-15 ENCOUNTER — Encounter: Payer: Self-pay | Admitting: Physical Therapy

## 2015-04-15 NOTE — Therapy (Signed)
[facility] [address] [place], [place], [address] Phone: [phone]   Fax:  [phone]  Physical Therapy Treatment  Patient Details  Name: Joanna Anderson MRN: [id number] Date of Birth: 1965-10-30 Referring Provider: Dr. Merlinda Frederick  Encounter Date: 04/14/2015      PT End of Session - 04/15/15 1152    Visit Number 4   Number of Visits 9   Date for PT Re-Evaluation 04/22/15   Authorization Type [facility] [employer]   PT Start Time 1540   PT Stop Time 1628   PT Time Calculation (min) 48 min      Past Medical History  Diagnosis Date  . Other malaise and fatigue   . Routine general medical examination at a health care facility   . Shoulder pain   . Dysmenorrhea   . Left knee pain   . Dysuria   . Hematuria   . Anxiety   . Encounter for long-term (current) use of other medications   . Vitamin D deficiency   . Pure hyperglyceridemia   . Vitamin D deficiency   . Headache   . Migraine   . Depression   . Anemia   . High cholesterol   . ASD (atrial septal defect) 06/11/2014    S/p patch repair  . VSD (ventricular septal defect), multiple 06/11/2014    S/p patch repair  . Atherosclerosis of abdominal aorta (HCC) 06/11/2014    Age advanced atherosclerosis of the infrarenal aorta    Past Surgical History  Procedure Laterality Date  . C seaction    . Vetricular hole    . Cesarean section    . Knee arthroscopy w/ acl reconstruction and hamstring graft Left     There were no vitals filed for this visit.  Visit Diagnosis:  Bilateral leg weakness  Abnormality of gait      Subjective Assessment - 04/15/15 1139    Subjective Pt states she is getting worse - says her R arm is getting weaker - states she feels tightness in her legs; reports she saw Dr. Alwyn Ren on Tuesday after the PT session and he contacted Dr. Terrace Arabia to expedite the neurology referral at [facility]; NCV and EMG are now scheduled for next Tues, 04-19-15                                                                                                                                      Pertinent History migraine headaches, fibromyalgia? - not definitely diagnosed with this by rheumatologist   Diagnostic tests MRI done 02-22-15 (STAT cervical spine) and then again at [facility] on 03-04-15  = states MRI's are not normal   Patient Stated Goals Be able to walk and return to work   Currently in Pain? No/denies  [facility] Adult PT Treatment/Exercise - 04/15/15 0001    Exercises   Exercises Knee/Hip   Lumbar Exercises: Machines for Strengthening   Leg Press 45# bil. LE's 3 sets 10 reps   Lumbar Exercises: Supine   Heel Slides 10 reps   Bridge 10 reps   Straight Leg Raise 10 reps   Knee/Hip Exercises: Stretches   Gastroc Stretch Both;1 rep;30 seconds   Knee/Hip Exercises: Aerobic   Stationary Bike Nustep level 3 x 10" with UE's and LE's   Knee/Hip Exercises: Standing   Heel Raises 1 set;10 reps   Other Standing Knee Exercises standing hip extension, abduction, and flexion with red theraband x 10 reps each LE   Knee/Hip Exercises: Seated   Long Arc Quad Both;10 reps  with red theraband                PT Education - 04/15/15 1152    Education provided Yes   Education Details see pt instructions   Person(s) Educated Patient;Spouse   Methods Explanation;Demonstration;Handout   Comprehension Verbalized understanding;Returned demonstration             PT Long Term Goals - 03/28/15 1850    PT LONG TERM GOAL #1   Title Pt will amb. household distances without use of RW.  (04-22-15)   Time 4   Period Weeks   Status New   PT LONG TERM GOAL #2   Title Assess TUG and establish goal as appropriate.  (04-22-15)   Time 4   Period Weeks   Status New   PT LONG TERM GOAL #3   Title Negotiate steps with 1 rail using a step over step sequence.  (04-22-15)   Time 4   Period Weeks   Status New   PT LONG TERM  GOAL #4   Title Improve Berg score by at least 8 points for improved balance/decr. fall risk.  (04-22-15)   Time 4   Period Weeks   Status New   PT LONG TERM GOAL #5   Title Independent in HEP for LE strengthening exercises.  (04-22-15)   Time 4   Period Weeks   Status New               Plan - 04/15/15 1153    Clinical Impression Statement Pt presents with fluctuating functional status - with RLE appearing weaker than LLE; pt exhibits gait instability with bil. knees buckling after PT session with pt able to independently recover with use of UE's on rollator   Pt will benefit from skilled therapeutic intervention in order to improve on the following deficits Abnormal gait;Decreased activity tolerance;Decreased balance;Decreased mobility;Decreased strength;Decreased coordination;Decreased endurance   Rehab Potential Good   PT Frequency 2x / week   PT Duration 4 weeks   PT Treatment/Interventions ADLs/Self Care Home Management;Gait training;Stair training;Functional mobility training;Therapeutic activities;Therapeutic exercise;Patient/family education;Neuromuscular re-education;Balance training   PT Next Visit Plan cont ther ex and gait   PT Home Exercise Plan strengthening exercises for bil. LE's   Consulted and Agree with Plan of Care Patient        Problem List Patient Active Problem List   Diagnosis Date Noted  . Neck pain 02/22/2015  . Abnormality of gait 02/22/2015  . Pelvic pain in female 10/02/2014  . Chest pain 06/11/2014  . ASD (atrial septal defect) 06/11/2014  . VSD (ventricular septal defect), multiple 06/11/2014  . Atherosclerosis of abdominal aorta (HCC) 06/11/2014  . Other malaise and fatigue   . Shoulder pain   .  Dysmenorrhea   . Dysuria   . Hematuria   . Anxiety   . Encounter for long-term (current) use of other medications   . Vitamin D deficiency   . Pure hyperglyceridemia   . Migraine   . Depression     Joanna Anderson, Donavan Burnet, PT 04/15/2015,  11:57 AM  [facility] [address] [place], [place], [address] Phone: [phone]   Fax:  [phone]  Name: Joanna Anderson MRN: [id number] Date of Birth: [birth year]

## 2015-04-15 NOTE — Patient Instructions (Signed)
Strengthening: Hip Extension - Resisted    With tubing around right ankle, face anchor and pull leg straight back. Repeat 10  times per set. Do _1-2___ sets per session. Do __1__ sessions per day.  http://orth.exer.us/637   Copyright  VHI. All rights reserved.  Strengthening: Hip Abduction - Resisted    With tubing around right leg, other side toward anchor, extend leg out from side. Repeat __10__ times per set. Do __1-2__ sets per session. Do _1___ sessions per day.  http://orth.exer.us/635   Copyright  VHI. All rights reserved.  Strengthening: Hip Flexion - Resisted    With tubing around left ankle, anchor behind, bring leg forward, keeping knee straight. Repeat __10-15__ times per set. Do __1-2__ sets per session. Do __1__ sessions per day.  http://orth.exer.us/639   Copyright  VHI. All rights reserved.

## 2015-04-16 ENCOUNTER — Telehealth: Payer: Self-pay

## 2015-04-16 NOTE — Telephone Encounter (Signed)
Pt has been having difficulty walking// feels that she needs a wheelchair... Please call to consult.  [phone]

## 2015-04-16 NOTE — Telephone Encounter (Signed)
She was doing okay with the walker a few days ago.  I am afraid a wheelchair will just weaken her legs.  Have her wait on the wheelchair, and discuss it with her physical therapist also.

## 2015-04-17 ENCOUNTER — Telehealth: Payer: Self-pay

## 2015-04-17 NOTE — Telephone Encounter (Signed)
Tramadol worked fine yesterday, can't take it at night - it keeps her awake.   In so much pain from muscle pain.  Is there anything else?  Screaming, crying.    [phone]

## 2015-04-18 NOTE — Telephone Encounter (Signed)
Dr. Alwyn RenHopper is off, can someone advise?

## 2015-04-18 NOTE — Telephone Encounter (Signed)
Spoke with patient on the phone - she reports her arm and leg pain is very uncontrolled. She states she is scared and doesn't know if she can take it anymore. She takes gabapentin 300 mg TID and tramadol 50 mg BID. Tramadol is written for TID but Dr. Alwyn RenHopper has been very adamant that she needs to try to take no more than twice a day. She states the tramadol works really well but sometimes keeps her up at night. She has appt with neurologist Dr. Terrace ArabiaYan tomorrow for EMG. I advised pt that she could take tramadol TID prn. I also advised that she take 2 tabs gabapentin (600 mg) at night and see if this helps the pain and helps her get some sleep. If works, perhaps Dr. Terrace ArabiaYan can change the rx to reflect this change. Will copy Dr. Alwyn RenHopper and Dr. Terrace ArabiaYan on this telephone message.

## 2015-04-18 NOTE — Telephone Encounter (Signed)
Thank you.  I am glad she has the appointment for an EMG.  I hope Dr. Terrace ArabiaYan can help define what is going on with the patient.  Janace Hoardavid Hopper MD

## 2015-04-19 ENCOUNTER — Ambulatory Visit (INDEPENDENT_AMBULATORY_CARE_PROVIDER_SITE_OTHER): Payer: 59 | Admitting: Neurology

## 2015-04-19 ENCOUNTER — Ambulatory Visit: Payer: 59 | Admitting: Occupational Therapy

## 2015-04-19 ENCOUNTER — Telehealth: Payer: Self-pay

## 2015-04-19 ENCOUNTER — Ambulatory Visit (INDEPENDENT_AMBULATORY_CARE_PROVIDER_SITE_OTHER): Payer: Self-pay | Admitting: Neurology

## 2015-04-19 ENCOUNTER — Other Ambulatory Visit: Payer: Self-pay | Admitting: Neurology

## 2015-04-19 ENCOUNTER — Telehealth: Payer: Self-pay | Admitting: Neurology

## 2015-04-19 DIAGNOSIS — R269 Unspecified abnormalities of gait and mobility: Secondary | ICD-10-CM

## 2015-04-19 DIAGNOSIS — M791 Myalgia, unspecified site: Secondary | ICD-10-CM | POA: Insufficient documentation

## 2015-04-19 DIAGNOSIS — Z0289 Encounter for other administrative examinations: Secondary | ICD-10-CM

## 2015-04-19 DIAGNOSIS — G43901 Migraine, unspecified, not intractable, with status migrainosus: Secondary | ICD-10-CM

## 2015-04-19 MED ORDER — BACLOFEN 10 MG PO TABS: 20.0000 mg | ORAL_TABLET | Freq: Three times a day (TID) | ORAL | Status: DC

## 2015-04-19 MED FILL — BACLOFEN 10 MG TABLET: 10 | 30 days supply | Qty: 180 | Fill #0

## 2015-04-19 MED FILL — traMADol HCL 50 MG TABS: 50 | 10 days supply | Qty: 30 | Fill #0

## 2015-04-19 NOTE — Telephone Encounter (Signed)
Corrected - patient aware.

## 2015-04-19 NOTE — Telephone Encounter (Signed)
Patient is calling and states when she picked up Rx Baclofen 10 mg tablets that the instructions were wrong.  She says the instructions " limit use to 1 to 2 days per week" is incorrect.  Please call.

## 2015-04-19 NOTE — Procedures (Signed)
   NCS (NERVE CONDUCTION STUDY) WITH EMG (ELECTROMYOGRAPHY) REPORT   STUDY DATE: April 19 2015 PATIENT NAME: Joanna Anderson DOB: November 29, 1965 MRN: [id number]    TECHNOLOGIST: Gearldine Shown ELECTROMYOGRAPHER: Levert Feinstein M.D.  CLINICAL INFORMATION:  50 years old female, with diffuse body achy pain, generalized weakness, patchy area of sensory alteration  FINDINGS: NERVE CONDUCTION STUDY: Bilateral peroneal sensory responses were normal. Bilateral peroneal to EDB, and tibial motor responses were normal, bilateral tibial H reflexes were normal and symmetric.  NEEDLE ELECTROMYOGRAPHY: Selected needle examination was performed at right upper, lower extremity muscles, right cervical paraspinal muscles, right lumbosacral paraspinal muscles.  Needle examination of right extensor digitorum communis, biceps, triceps, deltoid was normal.  There was no spontaneous activity at right cervical paraspinal muscles, right C5-6 and 7.  Selected needle examination of right tibialis anterior, tibialis posterior, peroneal longus, vastus lateralis, biceps femoris long head was normal.  There was no spontaneous activity at right lumbosacral paraspinal muscles, right L4-5 S1.  IMPRESSION:   This is a normal study. There is no electrodiagnostic evidence of large fiber peripheral neuropathy, inflammatory myopathy, right lumbosacral radiculopathy, right cervical radiculopathy.   INTERPRETING PHYSICIAN:   Levert Feinstein M.D. Ph.D. [facility] [address], [address] [place], [place] [address] [phone]

## 2015-04-19 NOTE — Telephone Encounter (Signed)
Hopper - Pt is requesting a motorized scooter due to the distances she is trying to walk.  Says she cant do anything, cant shop for kids, groceries...etc.  She states that she has been offered a job to work at home, but this requires [employer].  She cant walk across the street and travel that far into [place] on a walker for training.  She also tried to walk on an area that was gravel, but couldn't using a walker.  Says she is stuck and cant do anything.  She repeated these things over and over again.  Says if she cant get help she will end up on disability.  Please call [phone]

## 2015-04-19 NOTE — Progress Notes (Signed)
Electrodiagnostic study today is normal, there is no evidence of right lumbar, right cervical radiculopathies, there is no evidence of inflammatory myopathy

## 2015-04-20 NOTE — Telephone Encounter (Signed)
Pt is insistent on getting a motorized wheelchair. I asked pt if she will discuss with PT; she will discuss with P. She kept repeating all the things that she originally told [facility]. Pt says her "legs lock up" after doing a certain amount of work. Pt is having a lot of muscle spasms and cramps. Pt says it is disrupting her quality of life. Pt would like to avoid disability. Pt had me on the phone for 22 minutes trying to describe why she needed a motorized scooter or wheelchair.    Joanna Anderson  Signed  Service date: 04/19/2015 6:01 PM  Bookmark Copy    Expand All Collapse All   Hopper - Pt is requesting a motorized scooter due to the distances she is trying to walk. Says she cant do anything, cant shop for kids, groceries...etc. She states that she has been offered a job to work at home, but this requires [employer]. She cant walk across the street and travel that far into [facility] on a walker for training. She also tried to walk on an area that was gravel, but couldn't using a walker. Says she is stuck and cant do anything. She repeated these things over and over again. Says if she cant get help she will end up on disability. Please call [phone]

## 2015-04-20 NOTE — Telephone Encounter (Signed)
Attached to original phone message.

## 2015-04-21 ENCOUNTER — Ambulatory Visit: Payer: 59 | Admitting: Occupational Therapy

## 2015-04-21 ENCOUNTER — Encounter: Payer: Self-pay | Admitting: Physical Therapy

## 2015-04-21 ENCOUNTER — Telehealth: Payer: Self-pay | Admitting: Neurology

## 2015-04-21 ENCOUNTER — Other Ambulatory Visit: Payer: Self-pay | Admitting: *Deleted

## 2015-04-21 ENCOUNTER — Ambulatory Visit: Payer: 59 | Admitting: Physical Therapy

## 2015-04-21 ENCOUNTER — Telehealth: Payer: Self-pay

## 2015-04-21 DIAGNOSIS — M79602 Pain in left arm: Secondary | ICD-10-CM

## 2015-04-21 DIAGNOSIS — R269 Unspecified abnormalities of gait and mobility: Secondary | ICD-10-CM

## 2015-04-21 DIAGNOSIS — R531 Weakness: Secondary | ICD-10-CM

## 2015-04-21 DIAGNOSIS — M79601 Pain in right arm: Secondary | ICD-10-CM

## 2015-04-21 DIAGNOSIS — R279 Unspecified lack of coordination: Secondary | ICD-10-CM

## 2015-04-21 DIAGNOSIS — R278 Other lack of coordination: Secondary | ICD-10-CM

## 2015-04-21 DIAGNOSIS — R29898 Other symptoms and signs involving the musculoskeletal system: Secondary | ICD-10-CM

## 2015-04-21 MED ORDER — BACLOFEN 10 MG PO TABS: ORAL_TABLET | ORAL | Status: DC

## 2015-04-21 NOTE — Telephone Encounter (Signed)
Ok per Dr. Terrace Arabia, to take baclofen every 6 hours.  New rx has been sent to the pharmacy.

## 2015-04-21 NOTE — Telephone Encounter (Signed)
Patient is calling. She states the medication baclofen (LIORESAL) 10 MG tablet is working and yesterday the patient was able to walk without her walker. She states she needs this medication every 6 hours instead of every 8 hours because it wears off after 6 hours. Will it be ok for her to take every 6 hours and if so a new Rx would need to be called to [facility] on [address]. Please call and advise.

## 2015-04-21 NOTE — Therapy (Signed)
[facility] [address] [place], [place], [address] Phone: [phone]   Fax:  [phone]  Occupational Therapy Evaluation  Patient Details  Name: Joanna Anderson MRN: [id number] Date of Birth: 04-Dec-1965 Referring Provider: Dr. Merlinda Frederick  Encounter Date: 04/21/2015      OT End of Session - 04/21/15 1246    Visit Number 1   Number of Visits 5   Date for OT Re-Evaluation 06/19/15   Authorization Type [employer]   OT Start Time 0809   OT Stop Time 0845   OT Time Calculation (min) 36 min   Activity Tolerance Patient limited by pain   Behavior During Therapy Anxious      Past Medical History  Diagnosis Date  . Other malaise and fatigue   . Routine general medical examination at a health care facility   . Shoulder pain   . Dysmenorrhea   . Left knee pain   . Dysuria   . Hematuria   . Anxiety   . Encounter for long-term (current) use of other medications   . Vitamin D deficiency   . Pure hyperglyceridemia   . Vitamin D deficiency   . Headache   . Migraine   . Depression   . Anemia   . High cholesterol   . ASD (atrial septal defect) 06/11/2014    S/p patch repair  . VSD (ventricular septal defect), multiple 06/11/2014    S/p patch repair  . Atherosclerosis of abdominal aorta (HCC) 06/11/2014    Age advanced atherosclerosis of the infrarenal aorta    Past Surgical History  Procedure Laterality Date  . C seaction    . Vetricular hole    . Cesarean section    . Knee arthroscopy w/ acl reconstruction and hamstring graft Left     There were no vitals filed for this visit.  Visit Diagnosis:  Weakness generalized - Plan: Ot plan of care cert/re-cert  Pain in both upper extremities - Plan: Ot plan of care cert/re-cert  Decreased coordination - Plan: Ot plan of care cert/re-cert      Subjective Assessment - 04/21/15 0815    Subjective  Pt reports she started having pain a year ago, and has now in the last  month had  increased UE weakness.   Pertinent History see Epic   Patient Stated Goals to resume prior function and return to work   Currently in Pain? Yes   Pain Score 2    Pain Location Arm   Pain Orientation Right;Left   Pain Descriptors / Indicators Burning   Pain Type Acute pain   Pain Onset More than a month ago   Pain Frequency Constant   Aggravating Factors  unknown   Pain Relieving Factors unknown   Effect of Pain on Daily Activities limits daily activities   Multiple Pain Sites Yes           [facility] OT Assessment - 04/21/15 0001    Assessment   Diagnosis weakness   Referring Provider Dr. Merlinda Frederick   Onset Date --  1 year ago   Assessment Pt with pain that started 1 year ago, and now presents as weakness and falls. Pt had an EMG study on UE's with normal result. Pt can benefirt from OT for education rearding HEP and activity modification to increase safety.   Precautions   Precautions Fall   Balance Screen   Has the patient fallen in the past 6 months Yes   Has the  patient had a decrease in activity level because of a fear of falling?  Yes   Is the patient reluctant to leave their home because of a fear of falling?  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   Therapist recommended that pt has supervision with ADLS.   Mobility   Mobility Status --  supervision recommended due to multiple falls.   Written  Expression   Handwriting 100% legible  increased time   Observation/Other Assessments   Observations Pt was emotional and tearful throughtout eval. therapist recommends pt consider counseling, pt was resistive to this idea.   Sensation   Light Touch Impaired by gross assessment  left hand numbness per pt report   Coordination   Right 9 Hole Peg Test 21.88 secs   Left 9 Hole Peg Test 29.29 secs   ROM / Strength   AROM / PROM / Strength AROM;Strength   AROM   Overall AROM  Within functional limits for tasks performed   Strength   Overall Strength Other (comment)   Overall Strength Comments inconsistent results for MMT, appears to be grossly 3+/5-4/5 for bilateral UE's  will further assess in a functional context.   Hand Function   Right Hand Grip (lbs) 45 lbs   Left Hand Grip (lbs) 38 lbs                              OT Long Term Goals - 04/21/15 1245    OT LONG TERM GOAL #1   Title I with HEP for coordination and hand strength   Time 8   Period Weeks   Status New   OT LONG TERM GOAL #2   Title Pt will verbalize understanding of activity modification, AE/ DME to maximize safety and independence with ADLs/ IADLs.   Time 8   Period Weeks   Status New               Plan - 04/21/15 1610    Clinical Impression Statement Pt with generalized pain approximately a year ago and then pt reports new onset weakness in UE's. Pt can benefit from occupational therapy for education regarding HEP and ways to modify ADLs/ IADLs in order to increase safety and independence.   Pt will benefit from skilled therapeutic intervention in order to improve on the following deficits (Retired) Decreased coordination;Difficulty walking;Impaired sensation;Decreased endurance;Decreased activity tolerance;Pain;Impaired UE functional use;Increased muscle spasms;Decreased balance;Decreased mobility;Decreased strength   Rehab Potential Fair   Clinical Impairments Affecting Rehab  Potential uncertain diagnosis   OT Frequency Other (comment)  4 visits plus eval x 8 weeks   OT Duration 8 weeks   OT Treatment/Interventions Self-care/ADL training;Therapeutic exercise;Patient/family education;Balance training;Splinting;Neuromuscular education;Energy conservation;Therapeutic exercises;Manual Therapy;Therapeutic activities;DME and/or AE instruction;Parrafin;Cryotherapy;Electrical Stimulation;Fluidtherapy;Passive range of Teaching laboratory technician;Contrast Bath;Moist Heat   Plan education regarding activity modification/ AE for increased safety and HEP for LUE coordination/ hand strength.  Consulted and Agree with Plan of Care Patient        Problem List Patient Active Problem List   Diagnosis Date Noted  . Muscle pain 04/19/2015  . Neck pain 02/22/2015  . Abnormality of gait 02/22/2015  . Pelvic pain in female 10/02/2014  . Chest pain 06/11/2014  . ASD (atrial septal defect) 06/11/2014  . VSD (ventricular septal defect), multiple 06/11/2014  . Atherosclerosis of abdominal aorta (HCC) 06/11/2014  . Other malaise and fatigue   . Shoulder pain   . Dysmenorrhea   . Dysuria   . Hematuria   . Anxiety   . Encounter for long-term (current) use of other medications   . Vitamin D deficiency   . Pure hyperglyceridemia   . Migraine   . Depression     RINE,KATHRYN 04/21/2015, 12:59 PM Keene Breath, OTR/L [phone] Phone: [phone] 1:00 PM 04/21/2015 [facility] [address] [place], [place], [address] Phone: [phone]   Fax:  [phone]  Name: Joanna Anderson MRN: [id number] Date of Birth: Dec 17, 1965

## 2015-04-21 NOTE — Telephone Encounter (Signed)
Patient is calling because she needs an order for a wheel chair or walker. Patient states that her therapist mentioned that Dr. Alwyn Ren may need to consult with [facility]. Patient states that if she doesn't hear back today she will try to come in for an office visit.  Please call! [phone]

## 2015-04-22 ENCOUNTER — Encounter: Payer: Self-pay | Admitting: Physical Therapy

## 2015-04-22 NOTE — Telephone Encounter (Signed)
Patient is calling back because she's in a lot of pain and still can't walk. She wants to know if she can get an exteneded work note but doesn't know when she can return. Patient states that hes muscle spams are crippling her She also wants to know when Dr. Alwyn Ren wants to see her next week. Please call!

## 2015-04-22 NOTE — Therapy (Signed)
[facility] [address] [place], [place], [address] Phone: [phone]   Fax:  [phone]  Physical Therapy Treatment  Patient Details  Name: Joanna Anderson MRN: [id number] Date of Birth: 1965-11-20 Referring Provider: Dr. Merlinda Frederick  Encounter Date: 04/21/2015      PT End of Session - 04/22/15 1239    Visit Number 5   Number of Visits 9   Date for PT Re-Evaluation 04/22/15   Authorization Type [facility]   PT Start Time [phone]   PT Stop Time 0935   PT Time Calculation (min) 49 min      Past Medical History  Diagnosis Date  . Other malaise and fatigue   . Routine general medical examination at a health care facility   . Shoulder pain   . Dysmenorrhea   . Left knee pain   . Dysuria   . Hematuria   . Anxiety   . Encounter for long-term (current) use of other medications   . Vitamin D deficiency   . Pure hyperglyceridemia   . Vitamin D deficiency   . Headache   . Migraine   . Depression   . Anemia   . High cholesterol   . ASD (atrial septal defect) 06/11/2014    S/p patch repair  . VSD (ventricular septal defect), multiple 06/11/2014    S/p patch repair  . Atherosclerosis of abdominal aorta (HCC) 06/11/2014    Age advanced atherosclerosis of the infrarenal aorta    Past Surgical History  Procedure Laterality Date  . C seaction    . Vetricular hole    . Cesarean section    . Knee arthroscopy w/ acl reconstruction and hamstring graft Left     There were no vitals filed for this visit.  Visit Diagnosis:  Abnormality of gait  Bilateral leg weakness      Subjective Assessment - 04/22/15 1236    Subjective Pt states she received script for Baclofen - was able to to walk without her walker in the yard in her socks after taking this medication; is going to request from Dr. Terrace Arabia to take this medication every 6 hours   Pertinent History migraine headaches, fibromyalgia? - not definitely diagnosed  with this by rheumatologist   Diagnostic tests MRI done 02-22-15 (STAT cervical spine) and then again at [facility] on 03-04-15  = states MRI's are not normal   Patient Stated Goals Be able to walk and return to work   Currently in Pain? Yes   Pain Score 2    Pain Location Arm   Pain Orientation Right;Left   Pain Descriptors / Indicators Burning   Pain Type Acute pain   Pain Onset More than a month ago   Pain Frequency Constant             Self care:  spent session conversing with pt with explanation given as to why she will not qualify for scooter/power wheelchair; Questionable if she would qualify for manual wheelchair - functional status fluctuates with pt reporting ability to ambulate without Use of rollator after taking Baclofen Diagnosis remains of unknown etiology            [facility] Adult PT Treatment/Exercise - 04/22/15 0001    Lumbar Exercises: Machines for Strengthening   Leg Press 45# bil. LE's 3 sets 10 reps                     PT Long Term Goals -  03/28/15 1850    PT LONG TERM GOAL #1   Title Pt will amb. household distances without use of RW.  (04-22-15)   Time 4   Period Weeks   Status New   PT LONG TERM GOAL #2   Title Assess TUG and establish goal as appropriate.  (04-22-15)   Time 4   Period Weeks   Status New   PT LONG TERM GOAL #3   Title Negotiate steps with 1 rail using a step over step sequence.  (04-22-15)   Time 4   Period Weeks   Status New   PT LONG TERM GOAL #4   Title Improve Berg score by at least 8 points for improved balance/decr. fall risk.  (04-22-15)   Time 4   Period Weeks   Status New   PT LONG TERM GOAL #5   Title Independent in HEP for LE strengthening exercises.  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 in gravel with rollator  and is unable to amb. prolonged ambulation distances in community ; I have informed pt that she does not appear to qualify for scooter/power wheelchair  - and questionable if she would qualify for manual wheelchair; status fluctuates regarding mobility and ability to ambulate                                                                           Pt will benefit from skilled therapeutic intervention in order to improve on the following deficits Abnormal gait;Decreased activity tolerance;Decreased balance;Decreased mobility;Decreased strength;Decreased coordination;Decreased endurance   Rehab Potential Good   PT Frequency 2x / week   PT Duration 4 weeks   PT Treatment/Interventions ADLs/Self Care Home Management;Gait training;Stair training;Functional mobility training;Therapeutic activities;Therapeutic exercise;Patient/family education;Neuromuscular re-education;Balance training   PT Next Visit Plan cont ther ex and gait   PT Home Exercise Plan strengthening exercises for bil. LE's   Consulted and Agree with Plan of Care Patient        Problem List Patient Active Problem List   Diagnosis Date Noted  . Muscle pain 04/19/2015  . Neck pain 02/22/2015  . Abnormality of gait 02/22/2015  . Pelvic pain in female 10/02/2014  . Chest pain 06/11/2014  . ASD (atrial septal defect) 06/11/2014  . VSD (ventricular septal defect), multiple 06/11/2014  . Atherosclerosis of abdominal aorta (HCC) 06/11/2014  . Other malaise and fatigue   . Shoulder pain   . Dysmenorrhea   . Dysuria   . Hematuria   . Anxiety   . Encounter for long-term (current) use of other medications   . Vitamin D deficiency   . Pure hyperglyceridemia   . Migraine   . Depression     Chareese Sergent, Donavan Burnet, PT 04/22/2015, 12:46 PM  [facility] [address] [place], [place], [address] Phone:  [phone]   Fax:  [phone]  Name: Joanna Anderson MRN: [id number] Date of Birth: 12-22-1965

## 2015-04-23 NOTE — Telephone Encounter (Signed)
I am here Monday evening and Tuesday days and can see her whenever if she desires.

## 2015-04-23 NOTE — Telephone Encounter (Signed)
  Answer for now is no to the wheelchair.

## 2015-04-25 ENCOUNTER — Ambulatory Visit: Payer: 59 | Admitting: Physical Therapy

## 2015-04-25 ENCOUNTER — Encounter: Payer: Self-pay | Admitting: Physical Therapy

## 2015-04-25 DIAGNOSIS — R29898 Other symptoms and signs involving the musculoskeletal system: Secondary | ICD-10-CM | POA: Diagnosis not present

## 2015-04-25 DIAGNOSIS — M79602 Pain in left arm: Secondary | ICD-10-CM | POA: Diagnosis not present

## 2015-04-25 DIAGNOSIS — R269 Unspecified abnormalities of gait and mobility: Secondary | ICD-10-CM | POA: Diagnosis not present

## 2015-04-25 DIAGNOSIS — R279 Unspecified lack of coordination: Secondary | ICD-10-CM | POA: Diagnosis not present

## 2015-04-25 DIAGNOSIS — M79601 Pain in right arm: Secondary | ICD-10-CM | POA: Diagnosis not present

## 2015-04-25 DIAGNOSIS — R531 Weakness: Secondary | ICD-10-CM | POA: Diagnosis not present

## 2015-04-25 NOTE — Therapy (Signed)
[facility] [address] [place], [place], [address] Phone: [phone]   Fax:  [phone]  Physical Therapy Treatment  Patient Details  Name: Joanna Anderson MRN: [id number] Date of Birth: 09/18/1965 Referring Provider: Dr. Merlinda Frederick  Encounter Date: 04/25/2015      PT End of Session - 04/25/15 2114    Visit Number 6   Number of Visits 9   Date for PT Re-Evaluation 04/22/15   Authorization Type [facility]   PT Start Time 6962   PT Stop Time 0851   PT Time Calculation (min) 47 min      Past Medical History  Diagnosis Date  . Other malaise and fatigue   . Routine general medical examination at a health care facility   . Shoulder pain   . Dysmenorrhea   . Left knee pain   . Dysuria   . Hematuria   . Anxiety   . Encounter for long-term (current) use of other medications   . Vitamin D deficiency   . Pure hyperglyceridemia   . Vitamin D deficiency   . Headache   . Migraine   . Depression   . Anemia   . High cholesterol   . ASD (atrial septal defect) 06/11/2014    S/p patch repair  . VSD (ventricular septal defect), multiple 06/11/2014    S/p patch repair  . Atherosclerosis of abdominal aorta (HCC) 06/11/2014    Age advanced atherosclerosis of the infrarenal aorta    Past Surgical History  Procedure Laterality Date  . C seaction    . Vetricular hole    . Cesarean section    . Knee arthroscopy w/ acl reconstruction and hamstring graft Left     There were no vitals filed for this visit.  Visit Diagnosis:  Bilateral leg weakness  Abnormality of gait      Subjective Assessment - 04/25/15 2106    Subjective Pt amb. into clinic today without use of rollator  - "I don't want to use a walker"   Pertinent History migraine headaches, fibromyalgia? - not definitely diagnosed with this by rheumatologist   Diagnostic tests MRI done 02-22-15 (STAT cervical spine) and then again at [facility] on 03-04-15  =  states MRI's are not normal   Patient Stated Goals Be able to walk and return to work   Currently in Pain? No/denies        Leg Press - seat 12 - bil. LE's 60# 1 set 10; 70# 1 set 10 reps; 80# 1 set 10 reps;  RLE only 45# 15 reps;  LLE only 45# 1 set 15 reps                 [facility] Adult PT Treatment/Exercise - 04/25/15 0814    Ambulation/Gait   Ambulation/Gait Yes   Ambulation/Gait Assistance 6: Modified independent (Device/Increase time)   Ambulation Distance (Feet) 100 Feet   Assistive device None   Gait Pattern Within Functional Limits   Ambulation Surface Level;Indoor   Lumbar Exercises: Machines for Strengthening   Leg Press 60#, 70#, 80#  1 set 10 reps each   Knee/Hip Exercises: Aerobic   Elliptical 4" forward level 1.5   Knee/Hip Exercises: Standing   Heel Raises 1 set;10 reps   Hip Flexion AROM;Stengthening;Both;1 set;10 reps  with red theraband   Hip Abduction Both;Stengthening;1 set;10 reps  with red theraband   Hip Extension Stengthening;Both;1 set;10 reps  with red theraband   Forward Step Up Both;1  set;10 reps;Step Height: 6"   Step Down Both;1 set;Step Height: 6";10 reps   Functional Squat 10 reps  from mat      Quadriped - lifting opposite UE and LE x 3 reps each side 5 sec hold for trunk strengthening and stabilization  Prone plank x 10 sec hold x 1 rep Heel raises x 10 reps bil. LE's together              PT Long Term Goals - 03/28/15 1850    PT LONG TERM GOAL #1   Title Pt will amb. household distances without use of RW.  (04-22-15)   Time 4   Period Weeks   Status New   PT LONG TERM GOAL #2   Title Assess TUG and establish goal as appropriate.  (04-22-15)   Time 4   Period Weeks   Status New   PT LONG TERM GOAL #3   Title Negotiate steps with 1 rail using a step over step sequence.  (04-22-15)   Time 4   Period Weeks   Status New   PT LONG TERM GOAL #4   Title Improve Berg score by at least 8 points for improved  balance/decr. fall risk.  (04-22-15)   Time 4   Period Weeks   Status New   PT LONG TERM GOAL #5   Title Independent in HEP for LE strengthening exercises.  (04-22-15)   Time 4   Period Weeks   Status New               Plan - 04/25/15 2115    Clinical Impression Statement Pt demonstrating significant gains in mobility and much improvement in gait - not using RW today and had no occurrences of knees buckling   Pt will benefit from skilled therapeutic intervention in order to improve on the following deficits Abnormal gait;Decreased activity tolerance;Decreased balance;Decreased mobility;Decreased strength;Decreased coordination;Decreased endurance   Rehab Potential Good   PT Frequency 2x / week   PT Duration 4 weeks   PT Treatment/Interventions ADLs/Self Care Home Management;Gait training;Stair training;Functional mobility training;Therapeutic activities;Therapeutic exercise;Patient/family education;Neuromuscular re-education;Balance training   PT Next Visit Plan cont ther ex and gait   PT Home Exercise Plan strengthening exercises for bil. LE's   Consulted and Agree with Plan of Care Patient        Problem List Patient Active Problem List   Diagnosis Date Noted  . Muscle pain 04/19/2015  . Neck pain 02/22/2015  . Abnormality of gait 02/22/2015  . Pelvic pain in female 10/02/2014  . Chest pain 06/11/2014  . ASD (atrial septal defect) 06/11/2014  . VSD (ventricular septal defect), multiple 06/11/2014  . Atherosclerosis of abdominal aorta (HCC) 06/11/2014  . Other malaise and fatigue   . Shoulder pain   . Dysmenorrhea   . Dysuria   . Hematuria   . Anxiety   . Encounter for long-term (current) use of other medications   . Vitamin D deficiency   . Pure hyperglyceridemia   . Migraine   . Depression     Furman Trentman, Donavan Burnet, PT 04/25/2015, 9:18 PM  [facility] [address] [place], [place],  [address] Phone: [phone]   Fax:  [phone]  Name: Joanna Anderson MRN: [id number] Date of Birth: [birth year]

## 2015-04-26 ENCOUNTER — Ambulatory Visit (INDEPENDENT_AMBULATORY_CARE_PROVIDER_SITE_OTHER): Payer: 59 | Admitting: Family Medicine

## 2015-04-26 ENCOUNTER — Encounter: Payer: Self-pay | Admitting: Family Medicine

## 2015-04-26 VITALS — BP 132/84 | HR 85 | Temp 98.2°F | Resp 17 | Ht 66.5 in | Wt 172.0 lb

## 2015-04-26 DIAGNOSIS — R269 Unspecified abnormalities of gait and mobility: Secondary | ICD-10-CM

## 2015-04-26 DIAGNOSIS — F449 Dissociative and conversion disorder, unspecified: Secondary | ICD-10-CM | POA: Diagnosis not present

## 2015-04-26 NOTE — Telephone Encounter (Signed)
Pt is on the way in now.

## 2015-04-26 NOTE — Progress Notes (Signed)
Patient ID: Joanna Anderson, female    DOB: 09-17-1965  Age: 50 y.o. MRN: [id number]  Chief Complaint  Patient presents with  . Follow-up    Subjective:   Patient is doing much better than she was last week. She has started walking more. She does not feel like her gait is so unstable. She has had some long talks with her family. She does feel like maybe we will correct, that some of her symptoms are most for symptoms were from a conversion disorder. She is getting her strength back. The neurologist has her appointment [facility] but is not until March. She has never felt a psychiatrist, because she has been reluctant to see one. We talked about the need to do that.  Current allergies, medications, problem list, past/family and social histories reviewed.  Objective:  BP 132/84 mmHg  Pulse 85  Temp(Src) 98.2 F (36.8 C) (Oral)  Resp 17  Ht 5' 6.5" (1.689 m)  Wt 172 lb (78.019 kg)  BMI 27.35 kg/m2  SpO2 98%  Alert and oriented. Chest clear. Heart regular without murmurs. Little more flat affect today. Gait is fairly normal today, a little unsteady, but much better than it was.  Assessment & Plan:   Assessment: 1. Abnormality of gait   2. Conversion reaction       Plan: I really feel more and more convinced that this is going to turn out to be primarily psychiatric, but she still needs to follow through with the neurology aggravation. Spent a long time filling out the form for her insurance disability. Wrote a letter for her for excuse. Tried to find more information on a psychiatrist that specializes in conversion disorder but was otherwise able to do so. She will continue doing some research on that.    No orders of the defined types were placed in this encounter.    No orders of the defined types were placed in this encounter.         Patient Instructions  Continue current meds.  Get regular exercise  I still encourage you to see a psychiatrist to discuss  conversion reactions  Return in 2 weeks     Return in about 2 weeks (around 05/10/2015).   Nino Amano, MD 04/26/2015

## 2015-04-26 NOTE — Patient Instructions (Signed)
Continue current meds.  Get regular exercise  I still encourage you to see a psychiatrist to discuss conversion reactions  Return in 2 weeks

## 2015-04-28 ENCOUNTER — Encounter: Payer: Self-pay | Admitting: Physical Therapy

## 2015-04-28 ENCOUNTER — Ambulatory Visit: Payer: 59 | Admitting: Physical Therapy

## 2015-04-28 ENCOUNTER — Ambulatory Visit: Payer: 59 | Admitting: Occupational Therapy

## 2015-04-28 DIAGNOSIS — M79602 Pain in left arm: Secondary | ICD-10-CM | POA: Diagnosis not present

## 2015-04-28 DIAGNOSIS — R531 Weakness: Secondary | ICD-10-CM

## 2015-04-28 DIAGNOSIS — R29898 Other symptoms and signs involving the musculoskeletal system: Secondary | ICD-10-CM

## 2015-04-28 DIAGNOSIS — R269 Unspecified abnormalities of gait and mobility: Secondary | ICD-10-CM

## 2015-04-28 DIAGNOSIS — M79601 Pain in right arm: Secondary | ICD-10-CM

## 2015-04-28 DIAGNOSIS — R279 Unspecified lack of coordination: Secondary | ICD-10-CM | POA: Diagnosis not present

## 2015-04-28 DIAGNOSIS — R278 Other lack of coordination: Secondary | ICD-10-CM

## 2015-04-28 NOTE — Patient Instructions (Addendum)
  Coordination Activities  Perform the following activities for 20 minutes 20 times per day with both hand(s).   Rotate ball in fingertips (clockwise and counter-clockwise).  Toss ball between hands.  Toss ball in air and catch with the same hand.  Flip cards 1 at a time as fast as you can.  Deal cards with your thumb (Hold deck in hand and push card off top with thumb).  Rotate card in hand (clockwise and counter-clockwise).  Pick up coins and place in container or coin bank.  Pick up coins and stack.  Pick up coins one at a time until you get 5-10 in your hand, then move coins from palm to fingertips to stack one at a time.  Practice writing and/or typing.  Screw together nuts and bolts, then unfasten.   1. Grip Strengthening (Resistive Putty)   Squeeze putty using thumb and all fingers. Repeat _20___ times. Do __2__ sessions per day.   2. Roll putty into tube on table and pinch between each finger and thumb x 10 reps each. (can do ring and small finger together)     Copyright  [employer]. All rights reserved.

## 2015-04-28 NOTE — Therapy (Signed)
[facility] [address] [place], [place], [address] Phone: [phone]   Fax:  [phone]  Occupational Therapy Treatment  Patient Details  Name: Joanna Anderson MRN: [id number] Date of Birth: 10-17-1965 Referring Provider: Dr. Merlinda Frederick  Encounter Date: 04/28/2015      OT End of Session - 04/28/15 0936    Visit Number 2   Number of Visits 5   Date for OT Re-Evaluation 06/19/15   Authorization Type UMR   OT Start Time 0850   OT Stop Time 0930   OT Time Calculation (min) 40 min      Past Medical History  Diagnosis Date  . Other malaise and fatigue   . Routine general medical examination at a health care facility   . Shoulder pain   . Dysmenorrhea   . Left knee pain   . Dysuria   . Hematuria   . Anxiety   . Encounter for long-term (current) use of other medications   . Vitamin D deficiency   . Pure hyperglyceridemia   . Vitamin D deficiency   . Headache   . Migraine   . Depression   . Anemia   . High cholesterol   . ASD (atrial septal defect) 06/11/2014    S/p patch repair  . VSD (ventricular septal defect), multiple 06/11/2014    S/p patch repair  . Atherosclerosis of abdominal aorta (HCC) 06/11/2014    Age advanced atherosclerosis of the infrarenal aorta    Past Surgical History  Procedure Laterality Date  . C seaction    . Vetricular hole    . Cesarean section    . Knee arthroscopy w/ acl reconstruction and hamstring graft Left     There were no vitals filed for this visit.  Visit Diagnosis:  Weakness generalized  Pain in both upper extremities  Decreased coordination      Subjective Assessment - 04/28/15 0932    Subjective  Pt reports generalized pain   Pertinent History see Epic   Patient Stated Goals to resume prior function and return to work   Currently in Pain? Yes   Pain Score 4    Pain Location Generalized   Pain Orientation Other (Comment)  all over   Pain Type Acute  pain   Pain Onset More than a month ago   Pain Frequency Constant   Aggravating Factors  unknown   Pain Relieving Factors unknown   Effect of Pain on Daily Activities limits daily activities      Treatment: education regarding HEP for coordination and grip strength with red putty , min v.c. Then pt returned demonstration Therapist discussed importance of rest and activity.                        OT Education - 04/28/15 0959    Education provided Yes   Education Details HEP for coordination/ strength, improtance of balancing rest and activity   Person(s) Educated Patient   Methods Explanation;Demonstration   Comprehension Verbalized understanding;Returned demonstration             OT Long Term Goals - 04/21/15 1245    OT LONG TERM GOAL #1   Title I with HEP for coordination and hand strength   Time 8   Period Weeks   Status New   OT LONG TERM GOAL #2   Title Pt will verbalize understanding of activity modification, AE/ DME to maximize safety and independence with ADLs/ IADLs.  Time 8   Period Weeks   Status New               Plan - 04/28/15 0950    Clinical Impression Statement Pt is progressing towards goals.She demonstrates understanding of coordination HEP .   Pt will benefit from skilled therapeutic intervention in order to improve on the following deficits (Retired) Decreased coordination;Difficulty walking;Impaired sensation;Decreased endurance;Decreased activity tolerance;Pain;Impaired UE functional use;Increased muscle spasms;Decreased balance;Decreased mobility;Decreased strength   Rehab Potential Good   Clinical Impairments Affecting Rehab Potential uncertain diagnosis   OT Frequency Other (comment)  4 visits   OT Duration 8 weeks   OT Treatment/Interventions Self-care/ADL training;Therapeutic exercise;Patient/family education;Balance training;Splinting;Neuromuscular education;Energy conservation;Therapeutic exercises;Manual  Therapy;Therapeutic activities;DME and/or AE instruction;Parrafin;Cryotherapy;Electrical Stimulation;Fluidtherapy;Passive range of Teaching laboratory technician;Contrast Bath;Moist Heat   Plan education regarding activity modification/ AE         Problem List Patient Active Problem List   Diagnosis Date Noted  . Muscle pain 04/19/2015  . Neck pain 02/22/2015  . Abnormality of gait 02/22/2015  . Pelvic pain in female 10/02/2014  . Chest pain 06/11/2014  . ASD (atrial septal defect) 06/11/2014  . VSD (ventricular septal defect), multiple 06/11/2014  . Atherosclerosis of abdominal aorta (HCC) 06/11/2014  . Other malaise and fatigue   . Shoulder pain   . Dysmenorrhea   . Dysuria   . Hematuria   . Anxiety   . Encounter for long-term (current) use of other medications   . Vitamin D deficiency   . Pure hyperglyceridemia   . Migraine   . Depression     Ella Guillotte 04/28/2015, 10:02 AM Keene Breath, OTR/L [phone] Phone: [phone] 10:03 AM 04/28/2015 [facility] [address] [place], [place], [address] Phone: [phone]   Fax:  [phone]  Name: Joanna Anderson MRN: [id number] Date of Birth: 06/16/1965

## 2015-04-29 NOTE — Therapy (Signed)
[facility] [address] [place], [place], [address] Phone: [phone]   Fax:  [phone]  Physical Therapy Treatment  Patient Details  Name: Joanna Anderson MRN: [id number] Date of Birth: Jun 22, 1965 Referring Provider: Dr. Merlinda Frederick  Encounter Date: 04/28/2015      PT End of Session - 04/29/15 1135    Visit Number 7   Number of Visits 9   Date for PT Re-Evaluation 04/29/15   Authorization Type [facility] [employer]   PT Start Time [phone]   PT Stop Time 0930   PT Time Calculation (min) 42 min      Past Medical History  Diagnosis Date  . Other malaise and fatigue   . Routine general medical examination at a health care facility   . Shoulder pain   . Dysmenorrhea   . Left knee pain   . Dysuria   . Hematuria   . Anxiety   . Encounter for long-term (current) use of other medications   . Vitamin D deficiency   . Pure hyperglyceridemia   . Vitamin D deficiency   . Headache   . Migraine   . Depression   . Anemia   . High cholesterol   . ASD (atrial septal defect) 06/11/2014    S/p patch repair  . VSD (ventricular septal defect), multiple 06/11/2014    S/p patch repair  . Atherosclerosis of abdominal aorta (HCC) 06/11/2014    Age advanced atherosclerosis of the infrarenal aorta    Past Surgical History  Procedure Laterality Date  . C seaction    . Vetricular hole    . Cesarean section    . Knee arthroscopy w/ acl reconstruction and hamstring graft Left     There were no vitals filed for this visit.  Visit Diagnosis:  Bilateral leg weakness  Abnormality of gait      Subjective Assessment - 04/28/15 1715    Subjective Pt states she is not doing as well today as she was on Monday - states that she helped her daughter move yesterday so she is exhausted today; cont to amb. without use of walker   Pertinent History migraine headaches, fibromyalgia? - not definitely diagnosed with this by rheumatologist    Diagnostic tests MRI done 02-22-15 (STAT cervical spine) and then again at [facility] on 03-04-15  = states MRI's are not normal   Patient Stated Goals Be able to walk and return to work   Currently in Pain? Yes   Pain Score 4    Pain Type Acute pain   Pain Onset More than a month ago   Pain Frequency Constant                         [facility] Adult PT Treatment/Exercise - 04/29/15 0001    Ambulation/Gait   Ambulation/Gait Yes   Ambulation/Gait Assistance 6: Modified independent (Device/Increase time)   Ambulation Distance (Feet) 125 Feet   Assistive device None   Gait Pattern Within Functional Limits   Ambulation Surface Level;Indoor   Exercises   Exercises Knee/Hip   Lumbar Exercises: Machines for Strengthening   Leg Press 50# 3 sets 10 reps  decr. weight today due to c/o fatigue   Knee/Hip Exercises: Stretches   Gastroc Stretch Both;1 rep;30 seconds   Knee/Hip Exercises: Standing   Heel Raises 1 set;10 reps   Hip Abduction Both;Stengthening;1 set;10 reps  with blue theraband   Hip Extension Stengthening;Both;1 set;10 reps  with  blue theraband   Forward Step Up Both;1 set;10 reps;Step Height: 6"   Step Down Both;1 set;Step Height: 6";10 reps   Functional Squat 10 reps  from mat      TherEx; pt performed quadriped exercise - lifting opposite arm and leg x 3 reps each side with 5 sec hold for trunk/core strengthening  Prone plank x 1 rep 10 sec hold; R and L lateral side planks x 1 rep each 5 sec hold each side for core strengthening                 PT Long Term Goals - 03/28/15 1850    PT LONG TERM GOAL #1   Title Pt will amb. household distances without use of RW.  (04-22-15)   Time 4   Period Weeks   Status New   PT LONG TERM GOAL #2   Title Assess TUG and establish goal as appropriate.  (04-22-15)   Time 4   Period Weeks   Status New   PT LONG TERM GOAL #3   Title Negotiate steps with 1 rail using a step over step sequence.  (04-22-15)   Time 4    Period Weeks   Status New   PT LONG TERM GOAL #4   Title Improve Berg score by at least 8 points for improved balance/decr. fall risk.  (04-22-15)   Time 4   Period Weeks   Status New   PT LONG TERM GOAL #5   Title Independent in HEP for LE strengthening exercises.  (04-22-15)   Time 4   Period Weeks   Status New               Plan - 04/29/15 1136    Clinical Impression Statement Pt progressing towards goals - gait pattern varies depending on fatigue and c/o "exhaustion"; pt demonstrating more gait deviations today than on 04-25-15    Pt will benefit from skilled therapeutic intervention in order to improve on the following deficits Abnormal gait;Decreased activity tolerance;Decreased balance;Decreased mobility;Decreased strength;Decreased coordination;Decreased endurance   Rehab Potential Good   PT Frequency 2x / week   PT Duration 4 weeks   PT Treatment/Interventions ADLs/Self Care Home Management;Gait training;Stair training;Functional mobility training;Therapeutic activities;Therapeutic exercise;Patient/family education;Neuromuscular re-education;Balance training   PT Next Visit Plan cont ther ex and gait   PT Home Exercise Plan strengthening exercises for bil. LE's   Consulted and Agree with Plan of Care Patient        Problem List Patient Active Problem List   Diagnosis Date Noted  . Muscle pain 04/19/2015  . Neck pain 02/22/2015  . Abnormality of gait 02/22/2015  . Pelvic pain in female 10/02/2014  . Chest pain 06/11/2014  . ASD (atrial septal defect) 06/11/2014  . VSD (ventricular septal defect), multiple 06/11/2014  . Atherosclerosis of abdominal aorta (HCC) 06/11/2014  . Other malaise and fatigue   . Shoulder pain   . Dysmenorrhea   . Dysuria   . Hematuria   . Anxiety   . Encounter for long-term (current) use of other medications   . Vitamin D deficiency   . Pure hyperglyceridemia   . Migraine   . Depression     Joanna Anderson, Donavan Burnet,  PT 04/29/2015, 11:39 AM  [facility] [address] [place], [place], [address] Phone: [phone]   Fax:  [phone]  Name: Joanna Anderson MRN: [id number] Date of Birth: 10-01-1965

## 2015-05-03 ENCOUNTER — Ambulatory Visit: Payer: 59 | Admitting: Physical Therapy

## 2015-05-03 ENCOUNTER — Ambulatory Visit: Payer: 59 | Admitting: Occupational Therapy

## 2015-05-03 DIAGNOSIS — F447 Conversion disorder with mixed symptom presentation: Secondary | ICD-10-CM | POA: Diagnosis not present

## 2015-05-03 DIAGNOSIS — R531 Weakness: Secondary | ICD-10-CM | POA: Diagnosis not present

## 2015-05-03 DIAGNOSIS — R29898 Other symptoms and signs involving the musculoskeletal system: Secondary | ICD-10-CM

## 2015-05-03 DIAGNOSIS — R269 Unspecified abnormalities of gait and mobility: Secondary | ICD-10-CM

## 2015-05-03 DIAGNOSIS — M79602 Pain in left arm: Secondary | ICD-10-CM | POA: Diagnosis not present

## 2015-05-03 DIAGNOSIS — R279 Unspecified lack of coordination: Secondary | ICD-10-CM

## 2015-05-03 DIAGNOSIS — M79601 Pain in right arm: Secondary | ICD-10-CM

## 2015-05-03 DIAGNOSIS — R278 Other lack of coordination: Secondary | ICD-10-CM

## 2015-05-03 NOTE — Therapy (Signed)
[facility] [address] [place], [place], [address] Phone: [phone]   Fax:  [phone]  Occupational Therapy Treatment  Patient Details  Name: Joanna Anderson MRN: [id number] Date of Birth: Oct 10, 1965 Referring Provider: Dr. Delfino Lovett  Encounter Date: 05/03/2015      OT End of Session - 05/03/15 0830    Visit Number 3   Number of Visits 5   Date for OT Re-Evaluation 06/19/15   Authorization Type UMR   OT Start Time 0810   OT Stop Time 0840   OT Time Calculation (min) 30 min      Past Medical History  Diagnosis Date  . Other malaise and fatigue   . Routine general medical examination at a health care facility   . Shoulder pain   . Dysmenorrhea   . Left knee pain   . Dysuria   . Hematuria   . Anxiety   . Encounter for long-term (current) use of other medications   . Vitamin D deficiency   . Pure hyperglyceridemia   . Vitamin D deficiency   . Headache   . Migraine   . Depression   . Anemia   . High cholesterol   . ASD (atrial septal defect) 06/11/2014    S/p patch repair  . VSD (ventricular septal defect), multiple 06/11/2014    S/p patch repair  . Atherosclerosis of abdominal aorta ([place]) 06/11/2014    Age advanced atherosclerosis of the infrarenal aorta    Past Surgical History  Procedure Laterality Date  . C seaction    . Vetricular hole    . Cesarean section    . Knee arthroscopy w/ acl reconstruction and hamstring graft Left     There were no vitals filed for this visit.  Visit Diagnosis:  Weakness generalized  Pain in both upper extremities  Decreased coordination      Subjective Assessment - 05/03/15 0810    Subjective  Pt reports she is feeling much better   Pertinent History see Epic   Patient Stated Goals to resume prior function and return to work   Pain Score 3    Pain Descriptors / Indicators Aching   Pain Type Acute pain   Pain Onset More than a month ago   Pain  Frequency Constant   Aggravating Factors  unknown   Pain Relieving Factors unknown       Treatment:  Education performed regarding energy conservation techniques. Pt verbalized understanding and she report balancing work and rest better. Pt verbalizes understanding of HEP for coordination and grip strength.Paraffin applied to bilateral UE's x 10 mins for pain relief , no adverse reactions. Pt reports decreased pain after paraffin and increased. Pt reports she feels comfortable with d/c from OT                            OT Long Term Goals - 05/03/15 0825    OT LONG TERM GOAL #1   Title I with HEP for coordination and hand strength   Status Achieved   OT LONG TERM GOAL #2   Title Pt will verbalize understanding of activity modification, AE/ DME to maximize safety and independence with ADLs/ IADLs.   Status Achieved               Plan - 05/03/15 0827    Clinical Impression Statement Pt reports she is feeling much better overall. She reports trying to balance activity  and rest better at home.   Pt will benefit from skilled therapeutic intervention in order to improve on the following deficits (Retired) Decreased coordination;Difficulty walking;Impaired sensation;Decreased endurance;Decreased activity tolerance;Pain;Impaired UE functional use;Increased muscle spasms;Decreased balance;Decreased mobility;Decreased strength   Rehab Potential Good   Clinical Impairments Affecting Rehab Potential uncertain diagnosis   OT Frequency Other (comment)  4 visits   OT Treatment/Interventions Self-care/ADL training;Therapeutic exercise;Patient/family education;Balance training;Splinting;Neuromuscular education;Energy conservation;Therapeutic exercises;Manual Therapy;Therapeutic activities;DME and/or AE instruction;Parrafin;Cryotherapy;Electrical Stimulation;Fluidtherapy;Passive range of Surveyor, quantity;Contrast Bath;Moist Heat   Plan Pt agrees with plans to d/c     Consulted and Agree with Plan of Care Patient     OCCUPATIONAL THERAPY DISCHARGE SUMMARY   Current functional level related to goals / functional outcomes: Pt met goals. She reports she feels better overall.   Remaining deficits: Pain, decreased strength/ endurance   Education / Equipment: Pt was educated regarding HEP and energy conservation techniques/ activity modification. Pt verbalized understanding of all education.  Plan: Patient agrees to discharge.  Patient goals were met. Patient is being discharged due to meeting the stated rehab goals.  ?????       Problem List Patient Active Problem List   Diagnosis Date Noted  . Muscle pain 04/19/2015  . Neck pain 02/22/2015  . Abnormality of gait 02/22/2015  . Pelvic pain in female 10/02/2014  . Chest pain 06/11/2014  . ASD (atrial septal defect) 06/11/2014  . VSD (ventricular septal defect), multiple 06/11/2014  . Atherosclerosis of abdominal aorta ([place]) 06/11/2014  . Other malaise and fatigue   . Shoulder pain   . Dysmenorrhea   . Dysuria   . Hematuria   . Anxiety   . Encounter for long-term (current) use of other medications   . Vitamin D deficiency   . Pure hyperglyceridemia   . Migraine   . Depression     Drayson Dorko 05/03/2015, 8:32 AM Theone Murdoch, OTR/L [phone] Phone: [phone] 8:32 AM 05/03/2015 [facility] [address] [place], [place], [address] Phone: [phone]   Fax:  [phone]  Name: Joanna Anderson MRN: [id number] Date of Birth: [birth year]

## 2015-05-04 ENCOUNTER — Encounter: Payer: Self-pay | Admitting: Physical Therapy

## 2015-05-04 ENCOUNTER — Ambulatory Visit: Payer: 59 | Admitting: Neurology

## 2015-05-04 NOTE — Therapy (Signed)
[facility] [address] [place], [place], [address] Phone: [phone]   Fax:  [phone]  Physical Therapy Treatment  Patient Details  Name: Joanna Anderson MRN: [id number] Date of Birth: 1965/04/25 Referring Provider: Dr. Merlinda Frederick  Encounter Date: 05/03/2015      PT End of Session - 05/04/15 1204    Visit Number 8   Number of Visits 9   Date for PT Re-Evaluation 04/29/15   Authorization Type [facility] [employer]MR   PT Start Time [phone]   PT Stop Time 1016   PT Time Calculation (min) 44 min      Past Medical History  Diagnosis Date  . Other malaise and fatigue   . Routine general medical examination at a health care facility   . Shoulder pain   . Dysmenorrhea   . Left knee pain   . Dysuria   . Hematuria   . Anxiety   . Encounter for long-term (current) use of other medications   . Vitamin D deficiency   . Pure hyperglyceridemia   . Vitamin D deficiency   . Headache   . Migraine   . Depression   . Anemia   . High cholesterol   . ASD (atrial septal defect) 06/11/2014    S/p patch repair  . VSD (ventricular septal defect), multiple 06/11/2014    S/p patch repair  . Atherosclerosis of abdominal aorta (HCC) 06/11/2014    Age advanced atherosclerosis of the infrarenal aorta    Past Surgical History  Procedure Laterality Date  . C seaction    . Vetricular hole    . Cesarean section    . Knee arthroscopy w/ acl reconstruction and hamstring graft Left     There were no vitals filed for this visit.  Visit Diagnosis:  Bilateral leg weakness  Abnormality of gait      Subjective Assessment - 05/04/15 1200    Subjective Pt states she is doing better - is wanting to get off the medication; is being discharged from OT today; reports wanting to go back to work   Pertinent History migraine headaches, fibromyalgia? - not definitely diagnosed with this by rheumatologist   Diagnostic tests MRI done 02-22-15 (STAT  cervical spine) and then again at [facility] on 03-04-15  = states MRI's are not normal   Patient Stated Goals Be able to walk and return to work   Currently in Pain? No/denies                         [facility] Adult PT Treatment/Exercise - 05/04/15 0001    Exercises   Exercises Knee/Hip   Lumbar Exercises: Machines for Strengthening   Leg Press 60# 3 sets 10 reps  decr. weight today due to c/o fatigue   Knee/Hip Exercises: Stretches   Active Hamstring Stretch Both;1 rep;30 seconds   Gastroc Stretch Both;1 rep;30 seconds   Knee/Hip Exercises: Aerobic   Elliptical 5" forward level 1.5   Tread Mill 10" 2.0 mph   Knee/Hip Exercises: Standing   Heel Raises 1 set;10 reps   Hip Flexion AROM;Stengthening;Both;1 set;10 reps  with red theraband   Hip Abduction Both;Stengthening;1 set;10 reps  with blue theraband   Hip Extension Stengthening;Both;1 set;10 reps  with blue theraband   Forward Step Up Both;1 set;10 reps;Step Height: 6"   Functional Squat 10 reps  from mat  PT Long Term Goals - 05/04/15 1207    PT LONG TERM GOAL #1   Title Pt will amb. household distances without use of RW.  (04-22-15)   PT LONG TERM GOAL #2   Title Assess TUG and establish goal as appropriate.  (04-22-15)   PT LONG TERM GOAL #3   Title Negotiate steps with 1 rail using a step over step sequence.  (04-22-15)   PT LONG TERM GOAL #4   Title Improve Berg score by at least 8 points for improved balance/decr. fall risk.  (04-22-15)   PT LONG TERM GOAL #5   Title Independent in HEP for LE strengthening exercises.  (04-22-15)               Plan - 05/04/15 1204    Clinical Impression Statement Pt progressing well towards LTG's - mobility is improving   Pt will benefit from skilled therapeutic intervention in order to improve on the following deficits Abnormal gait;Decreased activity tolerance;Decreased balance;Decreased mobility;Decreased strength;Decreased  coordination;Decreased endurance   Rehab Potential Good   PT Frequency 2x / week   PT Duration 4 weeks   PT Treatment/Interventions ADLs/Self Care Home Management;Gait training;Stair training;Functional mobility training;Therapeutic activities;Therapeutic exercise;Patient/family education;Neuromuscular re-education;Balance training   PT Next Visit Plan determine to renew or D/C   PT Home Exercise Plan strengthening exercises for bil. LE's   Consulted and Agree with Plan of Care Patient        Problem List Patient Active Problem List   Diagnosis Date Noted  . Muscle pain 04/19/2015  . Neck pain 02/22/2015  . Abnormality of gait 02/22/2015  . Pelvic pain in female 10/02/2014  . Chest pain 06/11/2014  . ASD (atrial septal defect) 06/11/2014  . VSD (ventricular septal defect), multiple 06/11/2014  . Atherosclerosis of abdominal aorta (HCC) 06/11/2014  . Other malaise and fatigue   . Shoulder pain   . Dysmenorrhea   . Dysuria   . Hematuria   . Anxiety   . Encounter for long-term (current) use of other medications   . Vitamin D deficiency   . Pure hyperglyceridemia   . Migraine   . Depression     Kazmir Oki, Donavan Burnet, PT 05/04/2015, 12:07 PM  [facility] [address] [place], [place], [address] Phone: [phone]   Fax:  [phone]  Name: Joanna Anderson MRN: [id number] Date of Birth: 02/21/1966

## 2015-05-05 ENCOUNTER — Ambulatory Visit: Payer: 59 | Attending: Psychiatry | Admitting: Physical Therapy

## 2015-05-05 ENCOUNTER — Encounter: Payer: Self-pay | Admitting: Neurology

## 2015-05-05 ENCOUNTER — Ambulatory Visit (INDEPENDENT_AMBULATORY_CARE_PROVIDER_SITE_OTHER): Payer: 59 | Admitting: Neurology

## 2015-05-05 VITALS — BP 128/72 | HR 68 | Ht 66.5 in | Wt 170.0 lb

## 2015-05-05 DIAGNOSIS — M791 Myalgia, unspecified site: Secondary | ICD-10-CM

## 2015-05-05 DIAGNOSIS — G43009 Migraine without aura, not intractable, without status migrainosus: Secondary | ICD-10-CM

## 2015-05-05 DIAGNOSIS — R29898 Other symptoms and signs involving the musculoskeletal system: Secondary | ICD-10-CM | POA: Insufficient documentation

## 2015-05-05 DIAGNOSIS — R269 Unspecified abnormalities of gait and mobility: Secondary | ICD-10-CM | POA: Insufficient documentation

## 2015-05-05 MED ORDER — GABAPENTIN 300 MG PO CAPS: 300.0000 mg | ORAL_CAPSULE | Freq: Four times a day (QID) | ORAL | Status: DC

## 2015-05-05 MED FILL — GABAPENTIN 300 MG CAPSULE: 300 | 90 days supply | Qty: 360 | Fill #0

## 2015-05-05 NOTE — Progress Notes (Signed)
Chief Complaint  Patient presents with  . Migraine    She has not had any migraines since starting gabapentin.    Chief Complaint  Patient presents with  . Migraine    She has not had any migraines since starting gabapentin.       PATIENT: Joanna Anderson DOB: Sep 07, 1965  Chief Complaint  Patient presents with  . Migraine    She has not had any migraines since starting gabapentin.      HISTORICAL  Joanna Anderson is a 50 years old right-handed female, seen in refer by her primary care  Doctor Joanna Anderson for evaluation of weakness, unsteady gait, body pain, muscle spasm.  I saw her previously for migraine headaches, she had past medical history of atrioseptal defect, open heart surgery at childhood. She used to have migraine headaches in her twenties, clustered around her menstruation period of time, Imitrex has been very helpful.  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, jewellery at cardiac floor, she has been put off wintermittently   I have personally reviewed laboratory evaluation in November 2016, normal BMP CBC, ANA, CPK, vitamin D level  In this visit, she complains of subacute onset of gait difficulty, in April 2016, she felt low abdominal burning pain, then gradually noticed gait difficulty, especially when unloading grocery, her legs tends to give out underneath her while she holding object in her arms,  right leg worse  than the left, in July 2016, she began to have frequent back muscle, bilateral calf muscle spasm, intermittent arm and leg muscle twitching,  her labs give out underneath her unexpectedly, she denies bowel and bladder incontinence, complains neck pain, low back pain  During the interview, she was noted to unappropriate emotional outburst, quick shift between sadness and laugh.  UPDATE Mar 01 2015: She still has problem with her leg, right leg gave out underneath her sometimes,  "could not send signal to her right leg muscle", she complains of right above knee and right low back pain,   She also complains of right facial pain, intermittent right retro-orbital area headache with associated light noise sensitivity, nauseous.   We have reviewed MRI of the brain, mild supratentorium small gliosis, consistent with small vessel disease no acute lesions, MRI of cervical spine, multilevel cervical degenerative changes, there was no significant canal, foraminal stenosis.  UPDATE May 05 2015:  She was evaluated by [facility] neurologist May 03 2015, there was no neurological disease found, she is now accepting the diagnosis of conversion disorder, responded very well to physical therapy, she no longer has frequent migraine headaches,  REVIEW OF SYSTEMS: Full 14 system review of systems performed and notable only for achy muscles, muscle cramps, neck pain, neck stiffness ALLERGIES: Allergies  Allergen Reactions  . Sulfa Antibiotics Hives    HOME MEDICATIONS: Current Outpatient Prescriptions  Medication Sig Dispense Refill  . acetaminophen (TYLENOL) 650 MG CR tablet Take 650 mg by mouth every 8 (eight) hours as needed for  pain. Reported on 04/26/2015    . baclofen (LIORESAL) 10 MG tablet Take 2 tablets every six hours as needed. 240 tablet 1  . clonazePAM (KLONOPIN) 0.5 MG tablet Take 1 tablet (0.5 mg total) by mouth 2 (two) times daily as needed for anxiety. 60 tablet 0  . gabapentin (NEURONTIN) 300 MG  capsule Take 1 capsule (300 mg total) by mouth 3 (three) times daily. 90 capsule 11  . ibuprofen (ADVIL,MOTRIN) 200 MG tablet Take 200 mg by mouth every 6 (six) hours as needed for moderate pain.     . meloxicam (MOBIC) 7.5 MG tablet Take 1-2 tablets (7.5-15 mg total) by mouth daily. (Patient taking differently: Take 7.5 mg by mouth daily as needed for pain. ) 60 tablet 1  . pantoprazole (PROTONIX) 40 MG tablet Take 40 mg by mouth as needed.     . promethazine (PHENERGAN) 25 MG tablet Take 25 mg by mouth every 6 (six) hours as needed for nausea or vomiting. Reported on 04/26/2015    . SUMAtriptan (IMITREX) 50 MG tablet Take 1 tablet (50 mg total) by mouth every 2 (two) hours as needed for migraine. May repeat in 2 hours if headache persists or recurs. 15 tablet 6  . traMADol (ULTRAM) 50 MG tablet Take one every 8 hours only when needed for bad pain.  Use sparingly. 30 tablet 0   No current facility-administered medications for this visit.    PAST MEDICAL HISTORY: Past Medical History  Diagnosis Date  . Other malaise and fatigue   . Routine general medical examination at a health care facility   . Shoulder pain   . Dysmenorrhea   . Left knee pain   . Dysuria   . Hematuria   . Anxiety   . Encounter for long-term (current) use of other medications   . Vitamin D deficiency   . Pure hyperglyceridemia   . Vitamin D deficiency   . Headache   . Migraine   . Depression   . Anemia   . High cholesterol   . ASD (atrial septal defect) 06/11/2014    S/p patch repair  . VSD (ventricular septal defect), multiple 06/11/2014    S/p patch repair  . Atherosclerosis of abdominal aorta (HCC) 06/11/2014    Age advanced atherosclerosis of the infrarenal aorta    PAST SURGICAL HISTORY: Past Surgical History  Procedure Laterality Date  . C seaction    . Vetricular hole    . Cesarean section    . Knee arthroscopy w/ acl reconstruction and hamstring graft Left     FAMILY HISTORY: Family History    Problem Relation Age of Onset  . Depression Mother   . High blood pressure Mother   . Hyperlipidemia Mother   . Anxiety disorder Mother   . Hypertension Mother   . High blood pressure Father   . Hyperlipidemia Father   . Hypertension Father   . Crohn's disease Father   . Kidney disease Father   . Peripheral vascular disease Father   . Peripheral vascular disease Paternal Grandfather     SOCIAL HISTORY:  Social History   Social History  . Marital Status: Married    Spouse Name: N/A  . Number of Children: 4  . Years of Education: College   Occupational History  . RN     [employer]   Social History Main Topics  . Smoking status: Former Games developer  . Smokeless tobacco: Never Used  . Alcohol Use: No  . Drug Use: No  .  Sexual Activity: Not on file   Other Topics Concern  . Not on file   Social History Narrative   Lives at home with husband and children.   Right-handed.     PHYSICAL EXAM   Filed Vitals:   05/05/15 0812  BP: 128/72  Pulse: 68  Height: 5' 6.5" (1.689 m)  Weight: 170 lb (77.111 kg)    Not recorded      Body mass index is 27.03 kg/(m^2).  PHYSICAL EXAMNIATION:  Gen: NAD, conversant, well nourised, obese, well groomed                     Cardiovascular: Regular rate rhythm, no peripheral edema, warm, nontender. Eyes: Conjunctivae clear without exudates or hemorrhage Neck: Supple, no carotid bruise. Pulmonary: Clear to auscultation bilaterally   NEUROLOGICAL EXAM:  MENTAL STATUS: Speech:    Speech is normal; fluent and spontaneous with normal comprehension.  Cognition:     Orientation to time, place and person     Normal recent and remote memory     Normal Attention span and concentration     Normal Language, naming, repeating,spontaneous speech     Fund of knowledge   CRANIAL NERVES: CN II: Visual fields are full to confrontation. Fundoscopic exam is normal with sharp discs and no vascular changes. Pupils are round equal and briskly  reactive to light. CN III, IV, VI: extraocular movement are normal. No ptosis. CN V: Facial sensation is intact to pinprick in all 3 divisions bilaterally. Corneal responses are intact.  CN VII: Face is symmetric with normal eye closure and smile. CN VIII: Hearing is normal to rubbing fingers CN IX, X: Palate elevates symmetrically. Phonation is normal. CN XI: Head turning and shoulder shrug are intact CN XII: Tongue is midline with normal movements and no atrophy.  MOTOR: There is no pronator drift of out-stretched arms. Muscle bulk and tone are normal. Muscle strength is normal.  REFLEXES: Reflexes are 2+ and symmetric at the biceps, triceps, knees, and ankles. Plantar responses are flexor.  SENSORY: Intact to light touch, pinprick, position sense, and vibration sense are intact in fingers and toes.  COORDINATION: Rapid alternating movements and fine finger movements are intact. There is no dysmetria on finger-to-nose and heel-knee-shin.    GAIT/STANCE: She has variable effort during ambulate, mild unsteady stiff gait  DIAGNOSTIC DATA (LABS, IMAGING, TESTING) - I reviewed patient records, labs, notes, testing and imaging myself where available.   ASSESSMENT AND PLAN  Joanna Anderson is a 50 y.o. female  presenting with subacute onset of gait difficulty, bladder urgency, hyperreflexia on examinations  Gait difficulty  I went over the extensive neurological evaluation with her again, no significant findings on MRI of the brain, spine, electrodiagnostic study was normal,  She is accepting the diagnosis of conversion disorder, is able to handle her symptoms much better  Chronic migraines  Imitrex as needed  Overall has much improved Right facial pain  Gabapentin 300 mg 3 times a day  Levert Feinstein, M.D. Ph.D.  [facility] [address] [place], [place] [address] Ph: [phone] Fax: [phone]  CC: Quentin Mulling, MD

## 2015-05-06 ENCOUNTER — Encounter: Payer: Self-pay | Admitting: Physical Therapy

## 2015-05-06 NOTE — Therapy (Signed)
[facility] [address] [place], [place], [address] Phone: [phone]   Fax:  [phone]  Physical Therapy Treatment  Patient Details  Name: Joanna Anderson MRN: [id number] Date of Birth: Aug 31, 1965 Referring Provider: Dr. Delfino Lovett  Encounter Date: 05/05/2015      PT End of Session - 05/06/15 1042    Visit Number 9   Number of Visits 9   Date for PT Re-Evaluation 04/29/15   Authorization Type [facility] [employer]   PT Start Time [phone]   PT Stop Time 0930   PT Time Calculation (min) 43 min      Past Medical History  Diagnosis Date  . Other malaise and fatigue   . Routine general medical examination at a health care facility   . Shoulder pain   . Dysmenorrhea   . Left knee pain   . Dysuria   . Hematuria   . Anxiety   . Encounter for long-term (current) use of other medications   . Vitamin D deficiency   . Pure hyperglyceridemia   . Vitamin D deficiency   . Headache   . Migraine   . Depression   . Anemia   . High cholesterol   . ASD (atrial septal defect) 06/11/2014    S/p patch repair  . VSD (ventricular septal defect), multiple 06/11/2014    S/p patch repair  . Atherosclerosis of abdominal aorta ([place]) 06/11/2014    Age advanced atherosclerosis of the infrarenal aorta    Past Surgical History  Procedure Laterality Date  . C seaction    . Vetricular hole    . Cesarean section    . Knee arthroscopy w/ acl reconstruction and hamstring graft Left     There were no vitals filed for this visit.  Visit Diagnosis:  Bilateral leg weakness  Abnormality of gait      Subjective Assessment - 05/06/15 1038    Subjective "I'm ready to be done" - I'm almost completely back to normal - "ready to go back to work"   Pertinent History migraine headaches, fibromyalgia? - not definitely diagnosed with this by rheumatologist   Diagnostic tests MRI done 02-22-15 (STAT cervical spine) and then again at [facility] on 03-04-15   = states MRI's are not normal   Patient Stated Goals Be able to walk and return to work   Currently in Pain? No/denies                         [facility] Adult PT Treatment/Exercise - 05/06/15 0001    Ambulation/Gait   Ambulation/Gait Yes   Ambulation/Gait Assistance 7: Independent   Ambulation Distance (Feet) 150 Feet   Assistive device None   Gait Pattern Within Functional Limits   Ambulation Surface Level;Indoor   Stairs Yes   Stair Management Technique No rails   Number of Stairs 4   Height of Stairs 6   Lumbar Exercises: Machines for Strengthening   Leg Press 70# bil. LE's 3 sets 10 reps   Knee/Hip Exercises: Stretches   Active Hamstring Stretch Both;1 rep;30 seconds   Gastroc Stretch Both;1 rep;30 seconds   Knee/Hip Exercises: Aerobic   Elliptical 5" forward level 1.5   Tread Mill 10"  2.0 mph      TUG score 7.9 secs with no device  SLS = >10 secs on R and LLE                PT Long Term  Goals - 05/06/15 1042    PT LONG TERM GOAL #1   Title Pt will amb. household distances without use of RW.  (04-22-15)   Baseline met 05-03-15   Status Achieved   PT LONG TERM GOAL #2   Title Assess TUG and establish goal as appropriate.  (04-22-15)   Baseline TUG score 7.9 secs (WNL's) - 05-05-15   Status Achieved   PT LONG TERM GOAL #3   Title Negotiate steps with 1 rail using a step over step sequence.  (04-22-15)   Baseline met 05-05-15   Status Achieved   PT LONG TERM GOAL #4   Title Improve Berg score by at least 8 points for improved balance/decr. fall risk.  (04-22-15)   Baseline Balance WNL's on 05-05-15   Status Deferred   PT LONG TERM GOAL #5   Title Independent in HEP for LE strengthening exercises.  (04-22-15)   Baseline met 05-05-15   Status Achieved               Plan - 05/06/15 1044    Clinical Impression Statement All LTG's met - mobility, gait and balance are WNL's - pt is ready for D/C from PT   Pt will benefit from skilled therapeutic  intervention in order to improve on the following deficits Abnormal gait;Decreased activity tolerance;Decreased balance;Decreased mobility;Decreased strength;Decreased coordination;Decreased endurance   Rehab Potential Good   PT Frequency 2x / week   PT Duration 4 weeks   PT Treatment/Interventions ADLs/Self Care Home Management;Gait training;Stair training;Functional mobility training;Therapeutic activities;Therapeutic exercise;Patient/family education;Neuromuscular re-education;Balance training   PT Next Visit Plan D/C   PT Home Exercise Plan strengthening exercises for bil. LE's and walking program for increasing endurance   Consulted and Agree with Plan of Care Patient        Problem List Patient Active Problem List   Diagnosis Date Noted  . Muscle pain 04/19/2015  . Neck pain 02/22/2015  . Abnormality of gait 02/22/2015  . Pelvic pain in female 10/02/2014  . Chest pain 06/11/2014  . ASD (atrial septal defect) 06/11/2014  . VSD (ventricular septal defect), multiple 06/11/2014  . Atherosclerosis of abdominal aorta ([place]) 06/11/2014  . Other malaise and fatigue   . Shoulder pain   . Dysmenorrhea   . Dysuria   . Hematuria   . Anxiety   . Encounter for long-term (current) use of other medications   . Vitamin D deficiency   . Pure hyperglyceridemia   . Migraine   . Depression     PHYSICAL THERAPY DISCHARGE SUMMARY  Visits from Start of Care: 9  Current functional level related to goals / functional outcomes: ALL LTG's met - see above for progress; gait and balance are WNL's   Remaining deficits: Continued c/o fatigue and decr. endurance   Education / Equipment: HEP has been given for LE strengthening and recommend walking program to address endurance deficits. Plan: Patient agrees to discharge.  Patient goals were met. Patient is being discharged due to meeting the stated rehab goals.  ?????       Alda Lea, PT 05/06/2015, 10:46 AM  [facility] [address] [place], [place], [address] Phone: [phone]   Fax:  [phone]  Name: Joanna Anderson MRN: [id number] Date of Birth: January 21, 1966

## 2015-05-09 ENCOUNTER — Ambulatory Visit (INDEPENDENT_AMBULATORY_CARE_PROVIDER_SITE_OTHER): Payer: 59 | Admitting: Family Medicine

## 2015-05-09 VITALS — BP 126/74 | HR 96 | Temp 98.6°F | Resp 16 | Ht 65.5 in | Wt 169.5 lb

## 2015-05-09 DIAGNOSIS — F449 Dissociative and conversion disorder, unspecified: Secondary | ICD-10-CM | POA: Diagnosis not present

## 2015-05-09 DIAGNOSIS — R29898 Other symptoms and signs involving the musculoskeletal system: Secondary | ICD-10-CM | POA: Diagnosis not present

## 2015-05-09 DIAGNOSIS — F447 Conversion disorder with mixed symptom presentation: Secondary | ICD-10-CM

## 2015-05-09 DIAGNOSIS — F411 Generalized anxiety disorder: Secondary | ICD-10-CM

## 2015-05-09 MED ORDER — CLONAZEPAM 0.5 MG PO TABS: 0.5000 mg | ORAL_TABLET | Freq: Two times a day (BID) | ORAL | Status: DC | PRN

## 2015-05-09 NOTE — Progress Notes (Signed)
Patient ID: Joanna Anderson, female    DOB: [birth year]  Age: 50 y.o. MRN: [id number]  Chief Complaint  Patient presents with  . Follow-up    still a little weak, but feels much better since the last visit.  walking 2-3 miles daily    Subjective:   Patient saw her doctor at [facility]. The patient has finally accepted fully that she did have a conversion disorder. She says it started with some issues with her mother and sister and with a family vacation at the beach. She has come to grips with that. She feels like she is ready to go back to work. The Dr. at [facility] told her she could go back to work. She was advised to see a psychologist for some counseling and therapy. She would like a referral for this. She is not having problems with weakness in her legs. She is cleaning house, doing laundry, going to stores, and doing well.  Current allergies, medications, problem list, past/family and social histories reviewed.  Objective:  BP 126/74 mmHg  Pulse 96  Temp(Src) 98.6 F (37 C) (Oral)  Resp 16  Ht 5' 5.5" (1.664 m)  Wt 169 lb 8 oz (76.885 kg)  BMI 27.77 kg/m2  SpO2 95%  LMP 04/15/2015 (Exact Date)  No acute distress. Gait good.  Assessment & Plan:   Assessment: 1. Conversion disorder with mixed presentation   2. Leg weakness, bilateral   3. Anxiety state       Plan: Much much better. Will allow her to go back to work. She will be working 8 hour shifts at a lower load level than she was previously. She is looking toward to getting back into the mainstream of life. Needs a return to work release. I will give her that for 1 week from now. She has used up her FMLA time. Plan to see her back again next month. Gave her the names of a couple of psychologists she can see for some counseling services. She preferred [place] individuals.  No orders of the defined types were placed in this encounter.    Meds ordered this encounter  Medications  . clonazePAM (KLONOPIN) 0.5 MG tablet    Sig:  Take 1 tablet (0.5 mg total) by mouth 2 (two) times daily as needed for anxiety.    Dispense:  60 tablet    Refill:  0    See patient before further refills         Patient Instructions  Recommend seeing Karmen Bongo or  Nicole Cella for therapy.  Call: [phone]  Continue active exercise  Return to work Monday 05/16/15  Return in 4 weeks for followup      Return in about 4 weeks (around 06/06/2015).   Kamilah Correia, MD 05/09/2015

## 2015-05-09 NOTE — Patient Instructions (Addendum)
Recommend seeing Karmen Bongo or  Nicole Cella for therapy.  Call: [phone]  Continue active exercise  Return to work Monday 05/16/15  Return in 4 weeks for followup

## 2015-05-10 ENCOUNTER — Ambulatory Visit: Payer: 59 | Admitting: Physical Therapy

## 2015-05-12 ENCOUNTER — Ambulatory Visit: Payer: 59 | Admitting: Physical Therapy

## 2015-05-13 MED FILL — SUMATRIPTAN SUCC 50 MG TAB: 50 | 90 days supply | Qty: 27 | Fill #1

## 2015-05-17 ENCOUNTER — Encounter: Payer: 59 | Admitting: Occupational Therapy

## 2015-05-17 ENCOUNTER — Ambulatory Visit: Payer: 59 | Admitting: Physical Therapy

## 2015-05-19 ENCOUNTER — Ambulatory Visit: Payer: 59 | Admitting: Physical Therapy

## 2015-05-20 MED FILL — clonazePAM 0.5 MG TABS: 0.5 | 30 days supply | Qty: 60 | Fill #0

## 2015-05-24 ENCOUNTER — Encounter: Payer: 59 | Admitting: Occupational Therapy

## 2015-05-30 ENCOUNTER — Other Ambulatory Visit: Payer: Self-pay

## 2015-05-30 MED ORDER — PANTOPRAZOLE SODIUM 40 MG PO TBEC: 40.0000 mg | DELAYED_RELEASE_TABLET | ORAL | Status: DC | PRN

## 2015-05-30 MED FILL — PANTOPRAZOLE SOD DR 40 MG T: 40 | 90 days supply | Qty: 90 | Fill #0

## 2015-05-30 NOTE — Telephone Encounter (Signed)
Dr Alwyn Ren, pt called and wanted [place] to check with you to see if you will send a Rx for her pantoprazole? It doesn't look like you have Rxd this for her before. You have seen her recently for other issues, and it looks like back in 03/2014 you saw her w/Dx of peptic ulcer disease, but don't see Dx since then. Please advise. Pended.

## 2015-05-31 NOTE — Telephone Encounter (Signed)
Notified pt RF was sent in on VM.

## 2015-07-01 ENCOUNTER — Ambulatory Visit (INDEPENDENT_AMBULATORY_CARE_PROVIDER_SITE_OTHER): Payer: 59 | Admitting: Family Medicine

## 2015-07-01 VITALS — BP 106/62 | HR 76 | Temp 98.5°F | Resp 14 | Ht 66.0 in | Wt 173.0 lb

## 2015-07-01 DIAGNOSIS — G894 Chronic pain syndrome: Secondary | ICD-10-CM

## 2015-07-01 DIAGNOSIS — F411 Generalized anxiety disorder: Secondary | ICD-10-CM

## 2015-07-01 DIAGNOSIS — M79661 Pain in right lower leg: Secondary | ICD-10-CM | POA: Diagnosis not present

## 2015-07-01 DIAGNOSIS — M79662 Pain in left lower leg: Secondary | ICD-10-CM

## 2015-07-01 MED ORDER — CLONAZEPAM 0.5 MG PO TABS: 0.5000 mg | ORAL_TABLET | Freq: Two times a day (BID) | ORAL | Status: DC | PRN

## 2015-07-01 MED ORDER — TRAMADOL HCL 50 MG PO TABS: ORAL_TABLET | ORAL | Status: DC

## 2015-07-01 MED ORDER — GABAPENTIN 300 MG PO CAPS: 300.0000 mg | ORAL_CAPSULE | Freq: Four times a day (QID) | ORAL | Status: DC

## 2015-07-01 MED FILL — traMADol HCL 50 MG TABS: 50 | 10 days supply | Qty: 30 | Fill #0

## 2015-07-01 MED FILL — clonazePAM 0.5 MG TABS: 0.5 | 30 days supply | Qty: 60 | Fill #0

## 2015-07-01 NOTE — Progress Notes (Signed)
Patient ID: Marcille BuffyDeborah S Vowels, female    DOB: 1965/12/16  Age: 50 y.o. MRN: [id number]  Chief Complaint  Patient presents with  . Follow-up    Conversion disorder    Subjective:   50 year old lady who is here for a regular follow-up. She is back at work and doing fairly well. She has had a couple of episodes of twitching of the eyelid of the muscle of her upper arms or pain in twitching of her legs that she has worried about whether it was related to the conversion disorder and risk of relapse. However she is handling things okay with her family and with her job. She seems happy and pleasant. She still is taking gabapentin and Klonopin and tramadol, all very limited basis now. The gabapentin is 3 times a day. We talked about trying Benadryl for sleep which she is done past successfully.  Current allergies, medications, problem list, past/family and social histories reviewed.  Objective:  BP 106/62 mmHg  Pulse 76  Temp(Src) 98.5 F (36.9 C) (Oral)  Resp 14  Ht 5\' 6"  (1.676 m)  Wt 173 lb (78.472 kg)  BMI 27.94 kg/m2  SpO2 98%  No major acute distress. Pleasant and oriented. Without thyromegaly. Chest clear. Heart regular without murmur. She has put on weight.  Assessment & Plan:   Assessment: 1. Anxiety state   2. Pain in both lower legs   3. Chronic pain syndrome       Plan: History of conversion disorder, so able. Continue current meds. Recommended considering a psychologist. She will think about this.  No orders of the defined types were placed in this encounter.    Meds ordered this encounter  Medications  . clonazePAM (KLONOPIN) 0.5 MG tablet    Sig: Take 1 tablet (0.5 mg total) by mouth 2 (two) times daily as needed for anxiety.    Dispense:  60 tablet    Refill:  1  . gabapentin (NEURONTIN) 300 MG capsule    Sig: Take 1 capsule (300 mg total) by mouth 4 (four) times daily.    Dispense:  360 capsule    Refill:  3  . traMADol (ULTRAM) 50 MG tablet    Sig: Take  one every 8 hours only when needed for bad pain.  Use sparingly.    Dispense:  30 tablet    Refill:  0         Patient Instructions   Try using Benadryl in place of the Klonopin if possible  Continue your other medications as you are doing.  I would recommend you still consider seeing a counselor or psychologist which might be able to help you for having further problems with the conversion disorder that you struggled with previously.  Recommend returning in about 3 months for a recheck. As informed, I will not be here as a regular physician after early May, but others are here. If you establish elsewhere in the [facility] network the will have access to all these reports.    IF you received an x-ray today, you will receive an invoice from [facility]. Please contact [facility] at [phone] with questions or concerns regarding your invoice.   IF you received labwork today, you will receive an invoice from [facility]. Please contact [facility] at [phone] with questions or concerns regarding your invoice.   Our billing staff will not be able to assist you with questions regarding bills from these companies.  You will be contacted with the lab results  as soon as they are available. The fastest way to get your results is to activate your My Chart account. Instructions are located on the last page of this paperwork. If you have not heard from [place] regarding the results in 2 weeks, please contact this office.        No Follow-up on file.   HOPPER,DAVID, MD 07/01/2015

## 2015-07-01 NOTE — Patient Instructions (Addendum)
Try using Benadryl in place of the Klonopin if possible  Continue your other medications as you are doing.  I would recommend you still consider seeing a counselor or psychologist which might be able to help you for having further problems with the conversion disorder that you struggled with previously.  Recommend returning in about 3 months for a recheck. As informed, I will not be here as a regular physician after early May, but others are here. If you establish elsewhere in the [facility] network the will have access to all these reports.    IF you received an x-ray today, you will receive an invoice from [facility]. Please contact [facility] at [phone] with questions or concerns regarding your invoice.   IF you received labwork today, you will receive an invoice from [facility]. Please contact [facility] at [phone] with questions or concerns regarding your invoice.   Our billing staff will not be able to assist you with questions regarding bills from these companies.  You will be contacted with the lab results as soon as they are available. The fastest way to get your results is to activate your My Chart account. Instructions are located on the last page of this paperwork. If you have not heard from us regarding the results in 2 weeks, please contact this office.

## 2015-08-08 ENCOUNTER — Ambulatory Visit (INDEPENDENT_AMBULATORY_CARE_PROVIDER_SITE_OTHER): Payer: 59 | Admitting: Family Medicine

## 2015-08-08 VITALS — BP 108/70 | HR 70 | Temp 97.8°F | Resp 16 | Ht 66.0 in | Wt 168.0 lb

## 2015-08-08 DIAGNOSIS — L03114 Cellulitis of left upper limb: Secondary | ICD-10-CM | POA: Diagnosis not present

## 2015-08-08 DIAGNOSIS — M25519 Pain in unspecified shoulder: Secondary | ICD-10-CM | POA: Diagnosis not present

## 2015-08-08 DIAGNOSIS — M25512 Pain in left shoulder: Secondary | ICD-10-CM

## 2015-08-08 LAB — POCT CBC
Granulocyte percent: 62.9 %G (ref 37–80)
HCT, POC: 38.9 % (ref 37.7–47.9)
HEMOGLOBIN: 13.9 g/dL (ref 12.2–16.2)
LYMPH, POC: 3 (ref 0.6–3.4)
MCH, POC: 30.3 pg (ref 27–31.2)
MCHC: 35.7 g/dL — AB (ref 31.8–35.4)
MCV: 84.8 fL (ref 80–97)
MID (CBC): 0.4 (ref 0–0.9)
MPV: 8.5 fL (ref 0–99.8)
PLATELET COUNT, POC: 196 10*3/uL (ref 142–424)
POC Granulocyte: 5.7 (ref 2–6.9)
POC LYMPH PERCENT: 33.2 %L (ref 10–50)
POC MID %: 3.9 % (ref 0–12)
RBC: 4.59 M/uL (ref 4.04–5.48)
RDW, POC: 13.1 %
WBC: 9.1 10*3/uL (ref 4.6–10.2)

## 2015-08-08 LAB — URIC ACID: Uric Acid, Serum: 4.4 mg/dL (ref 2.4–7.0)

## 2015-08-08 LAB — POCT SEDIMENTATION RATE: POCT SED RATE: 27 mm/h — AB (ref 0–22)

## 2015-08-08 MED ORDER — DICLOFENAC SODIUM 75 MG PO TBEC: 75.0000 mg | DELAYED_RELEASE_TABLET | Freq: Two times a day (BID) | ORAL | Status: DC

## 2015-08-08 MED ORDER — AMOXICILLIN-POT CLAVULANATE 875-125 MG PO TABS: 1.0000 | ORAL_TABLET | Freq: Two times a day (BID) | ORAL | Status: DC

## 2015-08-08 MED FILL — AMOX TR-K CLV 875-125 MG TA: 875-125 | 10 days supply | Qty: 20 | Fill #0

## 2015-08-08 MED FILL — DICLOFENAC SOD EC 75 MG TAB: 75 | 10 days supply | Qty: 20 | Fill #0

## 2015-08-08 NOTE — Progress Notes (Signed)
Patient ID: Joanna Anderson, female    DOB: Dec 01, 1965  Age: 50 y.o. MRN: [id number]  Chief Complaint  Patient presents with  . Shoulder Pain    Left, redness, warm to touch    Subjective:   Patient is been having pain in her left shoulder a little bit from last few days. This morning she noticed it was read and hot to touch. Injury. She is doing well with her other previous problems.  Current allergies, medications, problem list, past/family and social histories reviewed.  Objective:  BP 108/70 mmHg  Pulse 70  Temp(Src) 97.8 F (36.6 C) (Oral)  Resp 16  Ht  (1.676 m)  Wt 168 lb (76.204 kg)  BMI 27.13 kg/m2  SpO2 96%  No major acute distress. She has an area of erythema about 6 or 7 cm in diameter on left shoulder, not very well circumscribed. It is very warm to touch. There is tenderness right at the point of the acromium.  Assessment & Plan:   Assessment: 1. Left shoulder pain   2. Cellulitis of left upper extremity       Plan: This could be a cellulitis, could be a bursitis. Check a CBC and sedimentation rate. I'm reluctant to inject her shoulder through the erythema in case it is a cellulitis.  Orders Placed This Encounter  Procedures  . Uric acid  . POCT SEDIMENTATION RATE  . POCT CBC    Meds ordered this encounter  Medications  . clindamycin (CLINDAGEL) 1 % gel    Sig: Apply topically 2 (two) times daily.  Marland Kitchen amoxicillin-clavulanate (AUGMENTIN) 875-125 MG tablet    Sig: Take 1 tablet by mouth 2 (two) times daily.    Dispense:  20 tablet    Refill:  0  . diclofenac (VOLTAREN) 75 MG EC tablet    Sig: Take 1 tablet (75 mg total) by mouth 2 (two) times daily.    Dispense:  20 tablet    Refill:  0     Results for orders placed or performed in visit on 08/08/15  POCT CBC  Result Value Ref Range   WBC 9.1 4.6 - 10.2 K/uL   Lymph, poc 3.0 0.6 - 3.4   POC LYMPH PERCENT 33.2 10 - 50 %L   MID (cbc) 0.4 0 - 0.9   POC MID % 3.9 0 - 12 %M   POC  Granulocyte 5.7 2 - 6.9   Granulocyte percent 62.9 37 - 80 %G   RBC 4.59 4.04 - 5.48 M/uL   Hemoglobin 13.9 12.2 - 16.2 g/dL   HCT, POC 40.9 81.1 - 47.9 %   MCV 84.8 80 - 97 fL   MCH, POC 30.3 27 - 31.2 pg   MCHC 35.7 (A) 31.8 - 35.4 g/dL   RDW, POC 91.4 %   Platelet Count, POC 196 142 - 424 K/uL   MPV 8.5 0 - 99.8 fL       Patient Instructions   Take the diclofenac twice daily at breakfast and supper for pain and inflammation  Take the Augmentin twice daily at breakfast and supper for antibiotic for possible cellulitis  Return if worse at any time or if it is not improving    IF you received an x-ray today, you will receive an invoice from [facility]. Please contact [facility] at [phone] with questions or concerns regarding your invoice.   IF you received labwork today, you will receive an invoice from [employer]. Please contact  [facility] at [phone] with questions or concerns regarding your invoice.   Our billing staff will not be able to assist you with questions regarding bills from these companies.  You will be contacted with the lab results as soon as they are available. The fastest way to get your results is to activate your My Chart account. Instructions are located on the last page of this paperwork. If you have not heard from us regarding the results in 2 weeks, please contact this office.         Return if symptoms worsen or fail to improve.   HOPPER,DAVID, MD 08/08/2015

## 2015-08-08 NOTE — Patient Instructions (Addendum)
Take the diclofenac twice daily at breakfast and supper for pain and inflammation  Take the Augmentin twice daily at breakfast and supper for antibiotic for possible cellulitis  Return if worse at any time or if it is not improving    IF you received an x-ray today, you will receive an invoice from [facility]. Please contact [facility] at [phone] with questions or concerns regarding your invoice.   IF you received labwork today, you will receive an invoice from [facility]. Please contact [facility] at [phone] with questions or concerns regarding your invoice.   Our billing staff will not be able to assist you with questions regarding bills from these companies.  You will be contacted with the lab results as soon as they are available. The fastest way to get your results is to activate your My Chart account. Instructions are located on the last page of this paperwork. If you have not heard from us regarding the results in 2 weeks, please contact this office.

## 2015-08-09 ENCOUNTER — Telehealth: Payer: Self-pay

## 2015-08-09 NOTE — Telephone Encounter (Signed)
Pt needs to talk with someone about the side effects of augmentin pt is having vomitting and swelling   Best number [phone]

## 2015-08-13 NOTE — Telephone Encounter (Signed)
Pt called asked pt to hold for CMA... CMA went to line **01- no call holding

## 2015-08-13 NOTE — Telephone Encounter (Signed)
Call patient: If not tolerating augmentin still and if skin is still red change to Keflex 500 #21 Take 1 tid for celluliis  If things look well may not need any treatment at this point.   If worse have her return

## 2015-08-13 NOTE — Telephone Encounter (Signed)
LMTRC

## 2015-08-15 NOTE — Telephone Encounter (Signed)
[facility] called [facility]. Per pt still not able to tolerate the Augmentin. I advise patient that if sxs have worsen or are not any better for her to come back in for follow up. Per pt would like to try the Keflex first.

## 2015-08-26 MED FILL — CEPHALEXIN 500 MG CAPSULE: 500 | 7 days supply | Qty: 21 | Fill #0

## 2015-09-13 MED FILL — GABAPENTIN 300 MG CAPSULE: 300 | 90 days supply | Qty: 360 | Fill #0 | Status: TO

## 2015-09-13 MED FILL — BACLOFEN 10 MG TABLET: 10 | 30 days supply | Qty: 240 | Fill #0

## 2015-09-29 MED FILL — clonazePAM 0.5 MG TABS: 0.5 | 30 days supply | Qty: 60 | Fill #1

## 2015-12-12 ENCOUNTER — Telehealth: Payer: Self-pay | Admitting: Family Medicine

## 2015-12-12 NOTE — Telephone Encounter (Signed)
Dail,Scott A referring spouse to establish care with PCP, please advise

## 2015-12-12 NOTE — Telephone Encounter (Signed)
Unable to take new patients at this time, other providers available at the office

## 2015-12-12 NOTE — Telephone Encounter (Signed)
Please advise 

## 2015-12-13 NOTE — Telephone Encounter (Signed)
LVM advising patient of message below °

## 2015-12-21 ENCOUNTER — Encounter: Payer: Self-pay | Admitting: Medical

## 2015-12-21 ENCOUNTER — Ambulatory Visit (INDEPENDENT_AMBULATORY_CARE_PROVIDER_SITE_OTHER): Payer: 59 | Admitting: Medical

## 2015-12-21 VITALS — BP 123/61 | HR 65 | Temp 98.2°F | Ht 66.0 in | Wt 166.0 lb

## 2015-12-21 DIAGNOSIS — F411 Generalized anxiety disorder: Secondary | ICD-10-CM

## 2015-12-21 DIAGNOSIS — M797 Fibromyalgia: Secondary | ICD-10-CM

## 2015-12-21 DIAGNOSIS — Z8739 Personal history of other diseases of the musculoskeletal system and connective tissue: Secondary | ICD-10-CM

## 2015-12-21 DIAGNOSIS — M6281 Muscle weakness (generalized): Secondary | ICD-10-CM | POA: Diagnosis not present

## 2015-12-21 DIAGNOSIS — E785 Hyperlipidemia, unspecified: Secondary | ICD-10-CM

## 2015-12-21 DIAGNOSIS — F449 Dissociative and conversion disorder, unspecified: Secondary | ICD-10-CM

## 2015-12-21 DIAGNOSIS — Z789 Other specified health status: Secondary | ICD-10-CM

## 2015-12-21 DIAGNOSIS — M79674 Pain in right toe(s): Secondary | ICD-10-CM

## 2015-12-21 DIAGNOSIS — G43909 Migraine, unspecified, not intractable, without status migrainosus: Secondary | ICD-10-CM

## 2015-12-21 DIAGNOSIS — R5383 Other fatigue: Secondary | ICD-10-CM

## 2015-12-21 DIAGNOSIS — M2550 Pain in unspecified joint: Secondary | ICD-10-CM

## 2015-12-21 DIAGNOSIS — M79662 Pain in left lower leg: Secondary | ICD-10-CM

## 2015-12-21 DIAGNOSIS — M79661 Pain in right lower leg: Secondary | ICD-10-CM

## 2015-12-21 DIAGNOSIS — F447 Conversion disorder with mixed symptom presentation: Secondary | ICD-10-CM

## 2015-12-21 LAB — COMPREHENSIVE METABOLIC PANEL
ALK PHOS: 73 U/L (ref 39–117)
ALT: 16 U/L (ref 0–35)
AST: 17 U/L (ref 0–37)
Albumin: 4 g/dL (ref 3.5–5.2)
BILIRUBIN TOTAL: 0.5 mg/dL (ref 0.2–1.2)
BUN: 14 mg/dL (ref 6–23)
CO2: 29 meq/L (ref 19–32)
Calcium: 9.3 mg/dL (ref 8.4–10.5)
Chloride: 104 mEq/L (ref 96–112)
Creatinine, Ser: 0.64 mg/dL (ref 0.40–1.20)
GFR: 105.19 mL/min (ref >60.00)
GLUCOSE: 87 mg/dL (ref 70–99)
Potassium: 4.7 mEq/L (ref 3.5–5.1)
SODIUM: 141 meq/L (ref 135–145)
TOTAL PROTEIN: 7.3 g/dL (ref 6.0–8.3)

## 2015-12-21 LAB — CBC WITH DIFFERENTIAL/PLATELET
BASOS ABS: 0 10*3/uL (ref 0.0–0.1)
Basophils Relative: 0.3 % (ref 0.0–3.0)
EOS ABS: 0.2 10*3/uL (ref 0.0–0.7)
Eosinophils Relative: 1.6 % (ref 0.0–5.0)
HEMATOCRIT: 39.5 % (ref 36.0–46.0)
HEMOGLOBIN: 13.2 g/dL (ref 12.0–15.0)
LYMPHS PCT: 23.5 % (ref 12.0–46.0)
Lymphs Abs: 2.2 10*3/uL (ref 0.7–4.0)
MCHC: 33.4 g/dL (ref 30.0–36.0)
MCV: 87.1 fl (ref 78.0–100.0)
Monocytes Absolute: 0.7 10*3/uL (ref 0.1–1.0)
Monocytes Relative: 7.5 % (ref 3.0–12.0)
Neutro Abs: 6.3 10*3/uL (ref 1.4–7.7)
Neutrophils Relative %: 67.1 % (ref 43.0–77.0)
Platelets: 215 10*3/uL (ref 150.0–400.0)
RBC: 4.53 Mil/uL (ref 3.87–5.11)
RDW: 13.3 % (ref 11.5–15.5)
WBC: 9.4 10*3/uL (ref 4.0–10.5)

## 2015-12-21 LAB — SEDIMENTATION RATE: SED RATE: 9 mm/h (ref 0–20)

## 2015-12-21 LAB — TSH: TSH: 1.63 u[IU]/mL (ref 0.35–4.50)

## 2015-12-21 MED ORDER — PROMETHAZINE HCL 25 MG PO TABS: 25.0000 mg | ORAL_TABLET | Freq: Three times a day (TID) | ORAL | 0 refills | Status: DC | PRN

## 2015-12-21 MED ORDER — MELOXICAM 7.5 MG PO TABS: 7.5000 mg | ORAL_TABLET | Freq: Every day | ORAL | 0 refills | Status: DC

## 2015-12-21 MED ORDER — TRAMADOL HCL 50 MG PO TABS: ORAL_TABLET | ORAL | 0 refills | Status: DC

## 2015-12-21 MED ORDER — PREGABALIN 75 MG PO CAPS: 75.0000 mg | ORAL_CAPSULE | Freq: Two times a day (BID) | ORAL | 0 refills | Status: DC

## 2015-12-21 MED FILL — MELOXICAM 7.5 MG TABLET: 7.5 | 30 days supply | Qty: 30 | Fill #0

## 2015-12-21 MED FILL — traMADol HCL 50 MG TABS: 50 | 5 days supply | Qty: 15 | Fill #0

## 2015-12-21 MED FILL — PROMETHAZINE 25 MG TABLET: 25 | 7 days supply | Qty: 20 | Fill #0

## 2015-12-21 MED FILL — LYRICA 75 MG CAPSULE: 75 | 30 days supply | Qty: 60 | Fill #0

## 2015-12-21 NOTE — Patient Instructions (Addendum)
For anxiety would advise continuing your clonazepam.  For toe pain/spur and left knee pain  will rx meloxicam. For diffuse body aches tramadol.  For occasional nausea with migarine will rx phenergan  Will refer to both neurologist and rheumatologist.  For fatigue get labs. Also gets labs for cholesterol and ra panel for her joint pains.  Potential fibrolmyalgia. Trial lyrica.  If you get extreme weakness with inability to move muscles/ext then ED evaluation.  Follow up in 2 weeks or as needed

## 2015-12-21 NOTE — Progress Notes (Signed)
Subjective:    Patient ID: Joanna Anderson, female    DOB: September 09, 1965, 50 y.o.   MRN: [id number]  HPI   Pt in for first time.  Pt states in for first time. She was seeing Dr. Alwyn Ren. Pt wants to stay with [facility].  Pt states that has history of hyperlipidemia despite healthy diet. Pt wants to   Pt also states she is a Engineer, civil (consulting). Works full time. She is in school getting A's, Diet healthy, married with 4 children.    She works cardiac unit. 4 children grown. Last year had some family stress and argued with her sister. So this stress continues.  Pt also states ACL tear 4 years ago. Used to jog a lot. Lt knee arthitis.  History of migraines- Last migraines last week. She takes neurontin 300 mg qid.  Pt seen neuruologist for migraines. No HA presently.   Pt describes some muscle aches/arthralgias all over in the past. Pt mentioned this to Dr. Alwyn Ren. He referred her to rheumatolgist. RA ruled out. Slight increased sed rate. Pt did not follow up. Pt states was on flexeril in past. Also was on cymbalta. Pt states with high dose cymbalta she could not walk. She states rt leg at that time had weakness and left leg/hips were weak. Pt went to [facility].  Pt states after this she had extreme rt leg muscle waisting and went to PT. Pt states neurolologist at [facility] thought polypharmacy. Speculate conversion disorder. Pt still has muscle spasms diffusely. Pt states last Thursday she felt her thigh were week. Dr. Debarah Crape saw pt and thought stress related. They ruled out ALS. Pt states baclofen does help he symptoms but makes her feel drowsy.  Regarding conversion disorder. Pt admits a lot of familys family issues per pt.   Pt does not have any severe back pain. Only the other day. Not presently. Described brief low level. No history of back pain with leg weakness.   Hx of some gerd.  Pt has some fatigue. Recent menses.    Review of Systems  Constitutional: Positive for fatigue. Negative for chills and fever.         Some mild fatigue.  HENT: Negative for congestion.   Respiratory: Negative for cough, choking, shortness of breath and wheezing.   Cardiovascular: Negative for chest pain and palpitations.  Gastrointestinal: Negative for abdominal pain, anal bleeding, diarrhea, nausea and rectal pain.  Musculoskeletal: Negative for back pain.       Hx of diffuse myalgias and some joint pain.  Skin: Negative for rash.  Neurological: Negative for dizziness, speech difficulty, light-headedness and headaches.       Episodes of trnasient upper and lower ext leg weakness in the past.  Psychiatric/Behavioral: Negative for behavioral problems, dysphoric mood, hallucinations, sleep disturbance and suicidal ideas. The patient is nervous/anxious.     Past Medical History:  Diagnosis Date  . Anemia   . Anxiety   . ASD (atrial septal defect) 06/11/2014   S/p patch repair  . Atherosclerosis of abdominal aorta (HCC) 06/11/2014   Age advanced atherosclerosis of the infrarenal aorta  . Depression   . Dysmenorrhea   . Dysuria   . Encounter for long-term (current) use of other medications   . Headache   . Hematuria   . High cholesterol   . Left knee pain   . Migraine   . Other malaise and fatigue   . Pure hyperglyceridemia   . Routine general medical examination at a health care facility   .  Shoulder pain   . Vitamin D deficiency   . Vitamin D deficiency   . VSD (ventricular septal defect), multiple 06/11/2014   S/p patch repair     Social History   Social History  . Marital status: Married    Spouse name: N/A  . Number of children: 4  . Years of education: College   Occupational History  . RN     [employer]   Social History Main Topics  . Smoking status: Former Games developer  . Smokeless tobacco: Never Used  . Alcohol use No  . Drug use: No  . Sexual activity: Not on file   Other Topics Concern  . Not on file   Social History Narrative   Lives at home with husband and children.   Right-handed.     Past Surgical History:  Procedure Laterality Date  . c seaction    . CESAREAN SECTION    . KNEE ARTHROSCOPY W/ ACL RECONSTRUCTION AND HAMSTRING GRAFT Left   . vetricular hole      Family History  Problem Relation Age of Onset  . Depression Mother   . High blood pressure Mother   . Hyperlipidemia Mother   . Anxiety disorder Mother   . Hypertension Mother   . High blood pressure Father   . Hyperlipidemia Father   . Hypertension Father   . Crohn's disease Father   . Kidney disease Father   . Peripheral vascular disease Father   . Peripheral vascular disease Paternal Grandfather     Allergies  Allergen Reactions  . Sulfa Antibiotics Hives    Current Outpatient Prescriptions on File Prior to Visit  Medication Sig Dispense Refill  . acetaminophen (TYLENOL) 650 MG CR tablet Take 650 mg by mouth every 8 (eight) hours as needed for pain. Reported on 07/01/2015    . baclofen (LIORESAL) 10 MG tablet Take 2 tablets every six hours as needed. 240 tablet 1  . clindamycin (CLINDAGEL) 1 % gel Apply topically 2 (two) times daily.    . clonazePAM (KLONOPIN) 0.5 MG tablet Take 1 tablet (0.5 mg total) by mouth 2 (two) times daily as needed for anxiety. 60 tablet 1  . gabapentin (NEURONTIN) 300 MG capsule Take 1 capsule (300 mg total) by mouth 4 (four) times daily. 360 capsule 3  . ibuprofen (ADVIL,MOTRIN) 200 MG tablet Take 200 mg by mouth every 6 (six) hours as needed for moderate pain.     . meloxicam (MOBIC) 7.5 MG tablet Take 1-2 tablets (7.5-15 mg total) by mouth daily. 60 tablet 1  . pantoprazole (PROTONIX) 40 MG tablet Take 1 tablet (40 mg total) by mouth as needed. 90 tablet 1  . promethazine (PHENERGAN) 25 MG tablet Take 25 mg by mouth every 6 (six) hours as needed for nausea or vomiting. Reported on 07/01/2015    . SUMAtriptan (IMITREX) 50 MG tablet Take 1 tablet (50 mg total) by mouth every 2 (two) hours as needed for migraine. May repeat in 2 hours if headache persists or recurs.  15 tablet 6  . traMADol (ULTRAM) 50 MG tablet Take one every 8 hours only when needed for bad pain.  Use sparingly. 30 tablet 0  . amoxicillin-clavulanate (AUGMENTIN) 875-125 MG tablet Take 1 tablet by mouth 2 (two) times daily. (Patient not taking: Reported on 12/21/2015) 20 tablet 0   No current facility-administered medications on file prior to visit.     BP 123/61   Pulse 65   Temp 98.2 F (36.8 C) (Oral)  Ht 5\' 6"  (1.676 m)   Wt 166 lb (75.3 kg)   LMP 12/21/2015   SpO2 98%   BMI 26.79 kg/m       Objective:   Physical Exam  General Mental Status- Alert. General Appearance- Not in acute distress. Appears anxious.  Skin General: Color- Normal Color. Moisture- Normal Moisture.  Neck Carotid Arteries- Normal color. Moisture- Normal Moisture. No carotid bruits. No JVD.  Chest and Lung Exam Auscultation: Breath Sounds:-Normal.  Cardiovascular Auscultation:Rythm- Regular. Murmurs & Other Heart Sounds:Auscultation of the heart reveals- No Murmurs.  Abdomen Inspection:-Inspeection Normal. Palpation/Percussion:Note:No mass. Palpation and Percussion of the abdomen reveal- Non Tender, Non Distended + BS, no rebound or guarding.    Neurologic Cranial Nerve exam:- CN III-XII intact(No nystagmus), symmetric smile. Drift Test:- No drift. Romberg Exam:- Negative.  Heal to Toe Gait exam:-Normal. Finger to Nose:- Normal/Intact Strength:- 5/5 equal and symmetric strength both upper and lower extremities.      Assessment & Plan:  For anxiety would advise continuing your clonazepam.  For toe pain/spur and left knee pain  will rx meloxicam. For diffuse body aches tramadol.  For occasional nausea with migarine will rx phenergan  Will refer to both neurologist and rheumatologist.  For fatigue get labs. Also gets labs for cholesterol and ra panel for her joint pains.  Potential fibrolmyalgia. Trial lyrica.  Follow up in 2 weeks or as needed  Jessina Marse, Ramon DredgeEdward, [employer]F CorporationPA-C

## 2015-12-22 ENCOUNTER — Telehealth: Payer: Self-pay | Admitting: Medical

## 2015-12-22 DIAGNOSIS — G4762 Sleep related leg cramps: Secondary | ICD-10-CM | POA: Diagnosis not present

## 2015-12-22 DIAGNOSIS — F5104 Psychophysiologic insomnia: Secondary | ICD-10-CM | POA: Diagnosis not present

## 2015-12-22 DIAGNOSIS — M2550 Pain in unspecified joint: Secondary | ICD-10-CM | POA: Diagnosis not present

## 2015-12-22 DIAGNOSIS — M25562 Pain in left knee: Secondary | ICD-10-CM | POA: Diagnosis not present

## 2015-12-22 LAB — ANA: ANA: NEGATIVE

## 2015-12-22 LAB — RHEUMATOID FACTOR: Rheumatoid fact SerPl-aCnc: <14 [IU]/mL

## 2015-12-22 NOTE — Telephone Encounter (Signed)
Dr. Vivia Anderson,  A mutual pt that I just saw for first time described fibromyalgia type complaints. She also has migraines as you know.   I wrote her for lyrica and she states fibromyalgia type pain and her legs feel better.   I am under impression she should only use one gabapentin or lyrica but not both.(pharmacist told her the same)  Pt states pharmacist told her lyrica could help with her head aches as well? I am not aware of such indication.   Presently she is happy that she has less body pain. I advised her to hold neurontin for now until we hear from you.   Is there something she could take for migraine control other than gabapentin. She is willing to come off of gabapentin long term. I just did not want her HA to flare/recoccur.  Thanks, Biochemist, clinicalaguier, Joanna DredgeEdward, PA-C

## 2015-12-22 NOTE — Telephone Encounter (Signed)
Relation to ZO:XWRUpt:self Call back number:[phone]   Reason for call:  Patient was seen specialist today advising patient has to choose between pregabalin (LYRICA) 75 MG capsule and gabapentin (NEURONTIN) 300 MG capsule. Patient states she prefers pregabalin (LYRICA) 75 MG capsule and would like to discuss.

## 2015-12-23 ENCOUNTER — Telehealth: Payer: Self-pay

## 2015-12-23 ENCOUNTER — Telehealth: Payer: Self-pay | Admitting: Neurology

## 2015-12-23 ENCOUNTER — Telehealth: Payer: Self-pay | Admitting: Medical

## 2015-12-23 NOTE — Telephone Encounter (Signed)
Chart reviewed, extensive evaluation in the past for gait abnormality, detailed in previous note in February 2017  It is okay to stop Neurontin and continue Lyrica if it works well for her body achy pain and migraine headaches, We often also use antiepileptic medication such as Topamax, Depakote Zonegran Beta blocker, Inderal, tricyclic antidepression nortriptyline as chronic migraine prevention  You may consider those medication choices,

## 2015-12-23 NOTE — Telephone Encounter (Signed)
Returned call to patient - left detailed message (ok per DPR).  Instructed her not to take the gabapentin and Lyrica together.  I asked her to call our office and schedule an appt to discuss other medication options for her migraines.

## 2015-12-23 NOTE — Telephone Encounter (Signed)
Called patient regarding lab results. States she has seen all results in My Chart.

## 2015-12-23 NOTE — Telephone Encounter (Signed)
Caller name: Relationship to patient: Self Can be reached: [phone] Pharmacy:  Reason for call: Please call patient about problem she is having trying to stop Neurotin

## 2015-12-23 NOTE — Telephone Encounter (Signed)
Patient called, states PCP wants to know if Dr. Terrace ArabiaYan can recommend another medication for migraines, can't take NEURONTIN along w/LYRICA. Patient also states she's having problems w/legs that she's had before, not as bad before, comes and goes, having problems w/walking a little bit, has been told it's caused by stress.

## 2015-12-24 NOTE — Telephone Encounter (Signed)
Dr. Terrace ArabiaYan did give send me some advise by email regarding her stopping gabapentin. He agreed while on lyrica should stop gabapentin. Dr. Terrace ArabiaYan thinks may help her migraines. However, I want to know how she is doing regarding her HA. Dr. Terrace ArabiaYan gave some recommendation on other medication to help conrol/prevent migraine. So would you please call patient and see how she is doing regarding her HA. Might rx new med.

## 2015-12-26 NOTE — Telephone Encounter (Signed)
Patient called back and states to disregard the message she left. She is still taking Neurotin and the new medication that Ramon Dredgedward gave her. She will discuss it when she see provider next week.

## 2015-12-26 NOTE — Telephone Encounter (Signed)
Called and left a message for call back  

## 2015-12-26 NOTE — Telephone Encounter (Signed)
Noted. Message routed to PCP.

## 2015-12-27 NOTE — Telephone Encounter (Signed)
Pt is on lyrica. She is no longer taking neurontin. Appears some mix up in last message received.

## 2015-12-28 ENCOUNTER — Other Ambulatory Visit (INDEPENDENT_AMBULATORY_CARE_PROVIDER_SITE_OTHER): Payer: 59

## 2015-12-28 DIAGNOSIS — E785 Hyperlipidemia, unspecified: Secondary | ICD-10-CM

## 2015-12-28 LAB — LIPID PANEL
CHOL/HDL RATIO: 5
CHOLESTEROL: 204 mg/dL — AB (ref 0–200)
HDL: 40.5 mg/dL (ref >39.00)
LDL CALC: 127 mg/dL — AB (ref 0–99)
NonHDL: 163.91
TRIGLYCERIDES: 187 mg/dL — AB (ref 0.0–149.0)
VLDL: 37.4 mg/dL (ref 0.0–40.0)

## 2015-12-31 MED ORDER — TRAMADOL HCL 50 MG PO TABS: 50.0000 mg | ORAL_TABLET | Freq: Four times a day (QID) | ORAL | 0 refills | Status: DC | PRN

## 2015-12-31 NOTE — Telephone Encounter (Signed)
rx tramadol printed please remind me to sign on Monday.

## 2015-12-31 NOTE — Telephone Encounter (Signed)
I saw message late Friday.  I will print limited prescription of tramadol that she can pick up on Monday.  Then on Wednesday she needs to sign contract and give uds. So when she comes in on Wednesday will you print contract and get her to fill out before I get in room.

## 2016-01-02 ENCOUNTER — Encounter: Payer: Self-pay | Admitting: Medical

## 2016-01-02 ENCOUNTER — Telehealth: Payer: Self-pay

## 2016-01-02 MED FILL — traMADol HCL 50 MG TABS: 50 | 2 days supply | Qty: 6 | Fill #0

## 2016-01-02 NOTE — Progress Notes (Unsigned)
Left message for patient on answering machine that Rx was ready for her. Asked which pharmacy she wanted faxed to.

## 2016-01-02 NOTE — Telephone Encounter (Signed)
Clled in Rx for Tramadol 50 mg #6 with 0 refills.

## 2016-01-02 NOTE — Telephone Encounter (Signed)
Called medication into pharmacy. 

## 2016-01-04 ENCOUNTER — Encounter: Payer: Self-pay | Admitting: Medical

## 2016-01-04 ENCOUNTER — Ambulatory Visit (INDEPENDENT_AMBULATORY_CARE_PROVIDER_SITE_OTHER): Payer: 59 | Admitting: Medical

## 2016-01-04 VITALS — BP 109/58 | HR 73 | Temp 98.8°F | Ht 66.0 in | Wt 163.2 lb

## 2016-01-04 DIAGNOSIS — E785 Hyperlipidemia, unspecified: Secondary | ICD-10-CM

## 2016-01-04 DIAGNOSIS — F411 Generalized anxiety disorder: Secondary | ICD-10-CM | POA: Diagnosis not present

## 2016-01-04 DIAGNOSIS — K219 Gastro-esophageal reflux disease without esophagitis: Secondary | ICD-10-CM | POA: Diagnosis not present

## 2016-01-04 DIAGNOSIS — R1013 Epigastric pain: Secondary | ICD-10-CM | POA: Diagnosis not present

## 2016-01-04 DIAGNOSIS — M797 Fibromyalgia: Secondary | ICD-10-CM | POA: Diagnosis not present

## 2016-01-04 DIAGNOSIS — Z79891 Long term (current) use of opiate analgesic: Secondary | ICD-10-CM | POA: Diagnosis not present

## 2016-01-04 LAB — CBC WITH DIFFERENTIAL/PLATELET
BASOS ABS: 0 10*3/uL (ref 0.0–0.1)
Basophils Relative: 0.3 % (ref 0.0–3.0)
EOS PCT: 1.1 % (ref 0.0–5.0)
Eosinophils Absolute: 0.1 10*3/uL (ref 0.0–0.7)
HEMATOCRIT: 39.8 % (ref 36.0–46.0)
Hemoglobin: 13.4 g/dL (ref 12.0–15.0)
LYMPHS PCT: 30.3 % (ref 12.0–46.0)
Lymphs Abs: 2.5 10*3/uL (ref 0.7–4.0)
MCHC: 33.7 g/dL (ref 30.0–36.0)
MCV: 86.6 fl (ref 78.0–100.0)
MONOS PCT: 5.9 % (ref 3.0–12.0)
Monocytes Absolute: 0.5 10*3/uL (ref 0.1–1.0)
NEUTROS ABS: 5.2 10*3/uL (ref 1.4–7.7)
Neutrophils Relative %: 62.4 % (ref 43.0–77.0)
PLATELETS: 191 10*3/uL (ref 150.0–400.0)
RBC: 4.6 Mil/uL (ref 3.87–5.11)
RDW: 13.4 % (ref 11.5–15.5)
WBC: 8.4 10*3/uL (ref 4.0–10.5)

## 2016-01-04 LAB — COMPREHENSIVE METABOLIC PANEL
ALK PHOS: 66 U/L (ref 39–117)
ALT: 16 U/L (ref 0–35)
AST: 18 U/L (ref 0–37)
Albumin: 4 g/dL (ref 3.5–5.2)
BILIRUBIN TOTAL: 0.7 mg/dL (ref 0.2–1.2)
BUN: 11 mg/dL (ref 6–23)
CALCIUM: 9.2 mg/dL (ref 8.4–10.5)
CO2: 30 meq/L (ref 19–32)
CREATININE: 0.66 mg/dL (ref 0.40–1.20)
Chloride: 103 mEq/L (ref 96–112)
GFR: 101.5 mL/min (ref >60.00)
Glucose, Bld: 100 mg/dL — ABNORMAL HIGH (ref 70–99)
POTASSIUM: 4.6 meq/L (ref 3.5–5.1)
Sodium: 139 mEq/L (ref 135–145)
TOTAL PROTEIN: 7.3 g/dL (ref 6.0–8.3)

## 2016-01-04 LAB — AMYLASE: Amylase: 47 U/L (ref 27–131)

## 2016-01-04 LAB — LIPASE: Lipase: 21 U/L (ref 11.0–59.0)

## 2016-01-04 MED ORDER — ROSUVASTATIN CALCIUM 5 MG PO TABS: 5.0000 mg | ORAL_TABLET | Freq: Every day | ORAL | 2 refills | Status: DC

## 2016-01-04 MED ORDER — TRAMADOL HCL 50 MG PO TABS: 50.0000 mg | ORAL_TABLET | Freq: Three times a day (TID) | ORAL | 0 refills | Status: DC | PRN

## 2016-01-04 MED ORDER — MUPIROCIN 2 % EX OINT: TOPICAL_OINTMENT | CUTANEOUS | 0 refills | Status: DC

## 2016-01-04 MED FILL — ROSUVASTATIN CALCIUM 5 MG T: 5 | 90 days supply | Qty: 90 | Fill #0

## 2016-01-04 NOTE — Patient Instructions (Addendum)
For your fibromyalgia will continue the lyrica.  Also continue the tramadol.  For anxiety will continue the clonazepam.     For your hyperlipidemia can continue fish oil. Low cholesterol diet. Also rx low dose crestor.  For your recent abd pain. Continue protonix. Will get labs today. Cbc, cmp, amylase, lipase and h pylori.  Regarding your ha history if getting more frequent then can give preventative meds as your neurologist had mentioned on her email to me. So please update me.  Follow up 1 month or as needed   Cholesterol Cholesterol is a white, waxy, fat-like substance needed by your body in small amounts. The liver makes all the cholesterol you need. Cholesterol is carried from the liver by the blood through the blood vessels. Deposits of cholesterol (plaque) may build up on blood vessel walls. These make the arteries narrower and stiffer. Cholesterol plaques increase the risk for heart attack and stroke.  You cannot feel your cholesterol level even if it is very high. The only way to know it is high is with a blood test. Once you know your cholesterol levels, you should keep a record of the test results. Work with your health care provider to keep your levels in the desired range.  WHAT DO THE RESULTS MEAN?  Total cholesterol is a rough measure of all the cholesterol in your blood.   LDL is the so-called bad cholesterol. This is the type that deposits cholesterol in the walls of the arteries. You want this level to be low.   HDL is the good cholesterol because it cleans the arteries and carries the LDL away. You want this level to be high.  Triglycerides are fat that the body can either burn for energy or store. High levels are closely linked to heart disease.  WHAT ARE THE DESIRED LEVELS OF CHOLESTEROL?  Total cholesterol below 200.   LDL below 100 for people at risk, below 70 for those at very high risk.   HDL above 50 is good, above 60 is best.   Triglycerides below  150.  HOW CAN I LOWER MY CHOLESTEROL?  Diet. Follow your diet programs as directed by your health care provider.   Choose fish or white meat chicken and [place]key, roasted or baked. Limit fatty cuts of red meat, fried foods, and processed meats, such as sausage and lunch meats.   Eat lots of fresh fruits and vegetables.  Choose whole grains, beans, pasta, potatoes, and cereals.   Use only small amounts of olive, corn, or canola oils.   Avoid butter, mayonnaise, shortening, or palm kernel oils.  Avoid foods with trans fats.   Drink skim or nonfat milk and eat low-fat or nonfat yogurt and cheeses. Avoid whole milk, cream, ice cream, egg yolks, and full-fat cheeses.   Healthy desserts include angel food cake, ginger snaps, animal crackers, hard candy, popsicles, and low-fat or nonfat frozen yogurt. Avoid pastries, cakes, pies, and cookies.   Exercise. Follow your exercise programs as directed by your health care provider.   A regular program helps decrease LDL and raise HDL.   A regular program helps with weight control.   Do things that increase your activity level like gardening, walking, or taking the stairs. Ask your health care provider about how you can be more active in your daily life.   Medicine. Take medicine only as directed by your health care provider.   Medicine may be prescribed by your health care provider to help lower cholesterol and decrease  the risk for heart disease.   If you have several risk factors, you may need medicine even if your levels are normal.   This information is not intended to replace advice given to you by your health care provider. Make sure you discuss any questions you have with your health care provider.   Document Released: 12/12/2000 Document Revised: 04/09/2014 Document Reviewed: 12/31/2012 Elsevier Interactive Patient Education [employer]

## 2016-01-04 NOTE — Progress Notes (Signed)
Subjective:    Patient ID: Joanna Anderson, female    DOB: Mar 15, 1966, 50 y.o.   MRN: [id number]  HPI    Pt in for follow up.   Pt had some body aches fibromyalgia like pain. I had written lyrica on past visit. And she reports it did help. She is not using gabapentin. This helped in past as well but dc'd gabapentin since both not recommended at same time. Pt also finds tramadol has helped in the past.(She states takes at most 1-2 tab a day) Pt thinks that lyrica may upset her stomach. But she is not sure that it causes. Feels ok with her stomach now. But she has transient abdomen pain in the past. Pt is on protonix.  Pt has some elevated cholesterol. Not much. Pt wanted to use just fish oil. Pt dad had pvd. Also some very significant carotid stenosis. Dad passed away before he got surgery. Pt dad was a smoker. Granddad dad side also had pvd.  Pt mother has high cholesterol. Her stress test was negative. Pt states her recent numbers have been at border of normal/slight elevated. She is willing to consider low dose crestor.  Pt has some anxiety. She uses clonazepam. Some insomnia. She does not like to take clonazepam to many nights in a row. She states will make her loopy.  Pt has insomnia but does not want to try trazadone or ambien .     Review of Systems  Constitutional: Negative for chills, fatigue and fever.  HENT: Negative for congestion and ear discharge.   Respiratory: Negative for chest tightness, shortness of breath and wheezing.   Cardiovascular: Negative for chest pain and palpitations.  Gastrointestinal: Positive for abdominal pain. Negative for blood in stool, diarrhea, nausea and vomiting.       Upset stomach on and off.  Musculoskeletal: Negative for arthralgias and back pain.       Body aches. Fibromyalgia like.  Skin: Negative for rash.  Neurological: Negative for dizziness and headaches.  Hematological: Negative for adenopathy. Does not bruise/bleed easily.    Psychiatric/Behavioral: Positive for sleep disturbance. Negative for agitation, confusion, self-injury and suicidal ideas. The patient is nervous/anxious.     Past Medical History:  Diagnosis Date  . Anemia   . Anxiety   . ASD (atrial septal defect) 06/11/2014   S/p patch repair  . Atherosclerosis of abdominal aorta (HCC) 06/11/2014   Age advanced atherosclerosis of the infrarenal aorta  . Depression   . Dysmenorrhea   . Dysuria   . Encounter for long-term (current) use of other medications   . Headache   . Hematuria   . High cholesterol   . Left knee pain   . Migraine   . Other malaise and fatigue   . Pure hyperglyceridemia   . Routine general medical examination at a health care facility   . Shoulder pain   . Vitamin D deficiency   . Vitamin D deficiency   . VSD (ventricular septal defect), multiple 06/11/2014   S/p patch repair     Social History   Social History  . Marital status: Married    Spouse name: N/A  . Number of children: 4  . Years of education: College   Occupational History  . RN     [employer]   Social History Main Topics  . Smoking status: Former Games developer  . Smokeless tobacco: Never Used  . Alcohol use No  . Drug use: No  . Sexual activity: Not on  file   Other Topics Concern  . Not on file   Social History Narrative   Lives at home with husband and children.   Right-handed.    Past Surgical History:  Procedure Laterality Date  . c seaction    . CESAREAN SECTION    . KNEE ARTHROSCOPY W/ ACL RECONSTRUCTION AND HAMSTRING GRAFT Left   . vetricular hole      Family History  Problem Relation Age of Onset  . Depression Mother   . High blood pressure Mother   . Hyperlipidemia Mother   . Anxiety disorder Mother   . Hypertension Mother   . High blood pressure Father   . Hyperlipidemia Father   . Hypertension Father   . Crohn's disease Father   . Kidney disease Father   . Peripheral vascular disease Father   . Peripheral vascular disease  Paternal Grandfather     Allergies  Allergen Reactions  . Sulfa Antibiotics Hives    Current Outpatient Prescriptions on File Prior to Visit  Medication Sig Dispense Refill  . acetaminophen (TYLENOL) 650 MG CR tablet Take 650 mg by mouth every 8 (eight) hours as needed for pain. Reported on 07/01/2015    . baclofen (LIORESAL) 10 MG tablet Take 2 tablets every six hours as needed. 240 tablet 1  . clindamycin (CLINDAGEL) 1 % gel Apply topically 2 (two) times daily.    Marland Kitchen ibuprofen (ADVIL,MOTRIN) 200 MG tablet Take 200 mg by mouth every 6 (six) hours as needed for moderate pain.     . meloxicam (MOBIC) 7.5 MG tablet Take 1-2 tablets (7.5-15 mg total) by mouth daily. 60 tablet 1  . pantoprazole (PROTONIX) 40 MG tablet Take 1 tablet (40 mg total) by mouth as needed. 90 tablet 1  . pregabalin (LYRICA) 75 MG capsule Take 1 capsule (75 mg total) by mouth 2 (two) times daily. 60 capsule 0  . promethazine (PHENERGAN) 25 MG tablet Take 1 tablet (25 mg total) by mouth every 8 (eight) hours as needed for nausea or vomiting. 20 tablet 0  . SUMAtriptan (IMITREX) 50 MG tablet Take 1 tablet (50 mg total) by mouth every 2 (two) hours as needed for migraine. May repeat in 2 hours if headache persists or recurs. 15 tablet 6  . traMADol (ULTRAM) 50 MG tablet Take 1 tablet (50 mg total) by mouth every 6 (six) hours as needed. 6 tablet 0  . clonazePAM (KLONOPIN) 0.5 MG tablet Take 1 tablet (0.5 mg total) by mouth 2 (two) times daily as needed for anxiety. (Patient not taking: Reported on 01/04/2016) 60 tablet 1   No current facility-administered medications on file prior to visit.     BP (!) 109/58   Pulse 73   Temp 98.8 F (37.1 C) (Oral)   Ht 5\' 6"  (1.676 m)   Wt 163 lb 3.2 oz (74 kg)   LMP 12/21/2015   SpO2 99%   BMI 26.34 kg/m       Objective:   Physical Exam   General Mental Status- Alert. General Appearance- Not in acute distress.   Skin General: Color- Normal Color. Moisture- Normal  Moisture.  Neck Carotid Arteries- Normal color. Moisture- Normal Moisture. No carotid bruits. No JVD.  Chest and Lung Exam Auscultation: Breath Sounds:-Normal.  Cardiovascular Auscultation:Rythm- Regular. Murmurs & Other Heart Sounds:Auscultation of the heart reveals- No Murmurs.  Abdomen Inspection:-Inspection Normal. Palpation/Percussion:Note:No mass. Palpation and Percussion of the abdomen reveal- Non Tender(but reports has pain is more epigastric/points to area), Non Distended +  BS, no rebound or guarding.   Neurologic Cranial Nerve exam:- CN III-XII intact(No nystagmus), symmetric smile. Strength:- 5/5 equal and symmetric strength both upper and lower extremities.     Assessment & Plan:  For your fibromyalgia will continue the lyrica.  Also continue the tramadol.  For anxiety will continue the clonazepam.(she has not used for 3-4 days)  For insomnia. I offered ambien. But pt declined.  For your hyperlipidemia can continue fish oil. Low cholesterol diet. Also rx low dose crestor.  For your recent abd pain. Continue protonix. Will get labs today. Cbc, cmp, amylase, lipase and h pylori.  Regarding your ha history if getting more frequent then can give  preventative meds as your neurologist had mentioned on her email to me. So please update me.  Pt will get uds today and she signed controlled med contract.  Follow up 1 month or as needed

## 2016-01-04 NOTE — Progress Notes (Signed)
Pre visit review using our clinic tool,if applicable. No additional management support is needed unless otherwise documented below in the visit note.  

## 2016-01-05 ENCOUNTER — Telehealth: Payer: Self-pay | Admitting: Emergency Medicine

## 2016-01-05 LAB — H. PYLORI BREATH TEST: H. pylori Breath Test: NOT DETECTED

## 2016-01-05 NOTE — Telephone Encounter (Signed)
Urine sample is here and ready for pick up today, I did call and leave a detailed message for patient to return to our office to fill out the UDS contract. KMP

## 2016-01-05 NOTE — Telephone Encounter (Signed)
Did she give uds? Would you call her and ask her to come in to sign contract? I gave to her and asked to give it to you?

## 2016-01-05 NOTE — Telephone Encounter (Signed)
Patient returned call, contract was at nurses desk, it has been filed for pick up. KMP

## 2016-01-05 NOTE — Telephone Encounter (Signed)
LMOVM for pt to return to our office to sign UDS contract per Whole FoodsEdward Saguier. KMP

## 2016-01-09 ENCOUNTER — Emergency Department (HOSPITAL_COMMUNITY): Payer: 59

## 2016-01-09 ENCOUNTER — Encounter (HOSPITAL_COMMUNITY): Payer: Self-pay | Admitting: Emergency Medicine

## 2016-01-09 ENCOUNTER — Encounter (HOSPITAL_COMMUNITY): Payer: Self-pay | Admitting: *Deleted

## 2016-01-09 ENCOUNTER — Ambulatory Visit (HOSPITAL_COMMUNITY)
Admission: EM | Admit: 2016-01-09 | Discharge: 2016-01-09 | Disposition: A | Discharge status: Home / Self Care | Payer: 59 | Attending: Internal Medicine | Admitting: Internal Medicine

## 2016-01-09 ENCOUNTER — Emergency Department (HOSPITAL_COMMUNITY)
Admission: EM | Admit: 2016-01-09 | Discharge: 2016-01-09 | Disposition: A | Discharge status: Home / Self Care | Payer: 59 | Attending: Emergency Medicine | Admitting: Emergency Medicine

## 2016-01-09 DIAGNOSIS — I499 Cardiac arrhythmia, unspecified: Secondary | ICD-10-CM

## 2016-01-09 DIAGNOSIS — I4519 Other right bundle-branch block: Secondary | ICD-10-CM | POA: Diagnosis not present

## 2016-01-09 DIAGNOSIS — Z87891 Personal history of nicotine dependence: Secondary | ICD-10-CM | POA: Diagnosis not present

## 2016-01-09 DIAGNOSIS — R0602 Shortness of breath: Secondary | ICD-10-CM | POA: Diagnosis present

## 2016-01-09 DIAGNOSIS — R079 Chest pain, unspecified: Secondary | ICD-10-CM

## 2016-01-09 DIAGNOSIS — I498 Other specified cardiac arrhythmias: Secondary | ICD-10-CM

## 2016-01-09 DIAGNOSIS — R0902 Hypoxemia: Secondary | ICD-10-CM | POA: Diagnosis not present

## 2016-01-09 DIAGNOSIS — R0600 Dyspnea, unspecified: Secondary | ICD-10-CM

## 2016-01-09 DIAGNOSIS — R072 Precordial pain: Secondary | ICD-10-CM | POA: Diagnosis not present

## 2016-01-09 LAB — CBC
HEMATOCRIT: 37.9 % (ref 36.0–46.0)
Hemoglobin: 12.6 g/dL (ref 12.0–15.0)
MCH: 29.6 pg (ref 26.0–34.0)
MCHC: 33.2 g/dL (ref 30.0–36.0)
MCV: 89.2 fL (ref 78.0–100.0)
PLATELETS: 159 10*3/uL (ref 150–400)
RBC: 4.25 MIL/uL (ref 3.87–5.11)
RDW: 13 % (ref 11.5–15.5)
WBC: 11.5 10*3/uL — AB (ref 4.0–10.5)

## 2016-01-09 LAB — BASIC METABOLIC PANEL
ANION GAP: 10 (ref 5–15)
BUN: 9 mg/dL (ref 6–20)
CHLORIDE: 104 mmol/L (ref 101–111)
CO2: 24 mmol/L (ref 22–32)
Calcium: 9 mg/dL (ref 8.9–10.3)
Creatinine, Ser: 0.69 mg/dL (ref 0.44–1.00)
Glucose, Bld: 114 mg/dL — ABNORMAL HIGH (ref 65–99)
POTASSIUM: 3.3 mmol/L — AB (ref 3.5–5.1)
SODIUM: 138 mmol/L (ref 135–145)

## 2016-01-09 LAB — I-STAT TROPONIN, ED
TROPONIN I, POC: 0 ng/mL (ref 0.00–0.08)
Troponin i, poc: 0 ng/mL (ref 0.00–0.08)

## 2016-01-09 LAB — D-DIMER, QUANTITATIVE (NOT AT ARMC): D DIMER QUANT: 0.43 ug{FEU}/mL (ref 0.00–0.50)

## 2016-01-09 NOTE — ED Notes (Signed)
Patient Alert and oriented X4. Stable and ambulatory. Patient verbalized understanding of the discharge instructions.  Patient belongings were taken by the patient.  

## 2016-01-09 NOTE — ED Triage Notes (Signed)
Pt here from [facility] with c/o right side chest pain and sob along with dizziness

## 2016-01-09 NOTE — ED Provider Notes (Signed)
[facility]    CSN: [id number] Arrival date & time: 01/09/16  1623     History   Chief Complaint Chief Complaint  Patient presents with  . Shortness of Breath    HPI Joanna Anderson is a 50 y.o. female. She presents today with the abrupt onset of breathlessness and hour ago, and some palpitations. At the urgent care, EKG demonstrated a rightward axis, and incomplete right bundle branch block. Cardiac activity on the monitor revealed spells of irregularly irregular heart rhythm, as well as sinus rhythm with PACs. She was recently diagnosed with fibromyalgia, and aches all the time. No unusual leg pain or swelling.  HPI  Past Medical History:  Diagnosis Date  . Anemia   . Anxiety   . ASD (atrial septal defect) 06/11/2014   S/p patch repair  . Atherosclerosis of abdominal aorta (HCC) 06/11/2014   Age advanced atherosclerosis of the infrarenal aorta  . Depression   . Dysmenorrhea   . Dysuria   . Encounter for long-term (current) use of other medications   . Headache   . Hematuria   . High cholesterol   . Left knee pain   . Migraine   . Other malaise and fatigue   . Pure hyperglyceridemia   . Routine general medical examination at a health care facility   . Shoulder pain   . Vitamin D deficiency   . Vitamin D deficiency   . VSD (ventricular septal defect), multiple 06/11/2014   S/p patch repair    Patient Active Problem List   Diagnosis Date Noted  . Muscle pain 04/19/2015  . Neck pain 02/22/2015  . Abnormality of gait 02/22/2015  . Pelvic pain in female 10/02/2014  . Chest pain 06/11/2014  . ASD (atrial septal defect) 06/11/2014  . VSD (ventricular septal defect), multiple 06/11/2014  . Atherosclerosis of abdominal aorta (HCC) 06/11/2014  . Other malaise and fatigue   . Shoulder pain   . Dysmenorrhea   . Dysuria   . Hematuria   . Anxiety   . Encounter for long-term (current) use of other medications   . Vitamin D deficiency   . Pure  hyperglyceridemia   . Migraine   . Depression     Past Surgical History:  Procedure Laterality Date  . c seaction    . CESAREAN SECTION    . KNEE ARTHROSCOPY W/ ACL RECONSTRUCTION AND HAMSTRING GRAFT Left   . vetricular hole       Home Medications    Prior to Admission medications   Medication Sig Start Date End Date Taking? Authorizing Provider  acetaminophen (TYLENOL) 650 MG CR tablet Take 650 mg by mouth every 8 (eight) hours as needed for pain. Reported on 07/01/2015    Historical Provider, MD  baclofen (LIORESAL) 10 MG tablet Take 2 tablets every six hours as needed. 04/21/15   Levert Feinstein, MD  clindamycin (CLINDAGEL) 1 % gel Apply topically 2 (two) times daily.    Historical Provider, MD  clonazePAM (KLONOPIN) 0.5 MG tablet Take 1 tablet (0.5 mg total) by mouth 2 (two) times daily as needed for anxiety. Patient not taking: Reported on 01/04/2016 07/01/15   Peyton Najjar, MD  ibuprofen (ADVIL,MOTRIN) 200 MG tablet Take 200 mg by mouth every 6 (six) hours as needed for moderate pain.     Historical Provider, MD  meloxicam (MOBIC) 7.5 MG tablet Take 1-2 tablets (7.5-15 mg total) by mouth daily. 10/02/14   Wallis Bamberg, PA-C  pantoprazole (PROTONIX) 40 MG  tablet Take 1 tablet (40 mg total) by mouth as needed. 05/30/15   Peyton Najjaravid H Hopper, MD  pregabalin (LYRICA) 75 MG capsule Take 1 capsule (75 mg total) by mouth 2 (two) times daily. 12/21/15   Ramon DredgeEdward Saguier, PA-C  promethazine (PHENERGAN) 25 MG tablet Take 1 tablet (25 mg total) by mouth every 8 (eight) hours as needed for nausea or vomiting. 12/21/15   Esperanza RichtersEdward Saguier, PA-C  rosuvastatin (CRESTOR) 5 MG tablet Take 1 tablet (5 mg total) by mouth daily. 01/04/16   Ramon DredgeEdward Saguier, PA-C  SUMAtriptan (IMITREX) 50 MG tablet Take 1 tablet (50 mg total) by mouth every 2 (two) hours as needed for migraine. May repeat in 2 hours if headache persists or recurs. 03/01/15   Levert FeinsteinYijun Yan, MD  traMADol (ULTRAM) 50 MG tablet Take 1 tablet (50 mg total) by mouth every  8 (eight) hours as needed. Can fill on January 09, 2016.(not sooner please) 01/04/16   Esperanza RichtersEdward Saguier, PA-C    Family History Family History  Problem Relation Age of Onset  . Depression Mother   . High blood pressure Mother   . Hyperlipidemia Mother   . Anxiety disorder Mother   . Hypertension Mother   . High blood pressure Father   . Hyperlipidemia Father   . Hypertension Father   . Crohn's disease Father   . Kidney disease Father   . Peripheral vascular disease Father   . Peripheral vascular disease Paternal Grandfather     Social History Social History  Substance Use Topics  . Smoking status: Former Games developermoker  . Smokeless tobacco: Never Used  . Alcohol use No     Allergies   Sulfa antibiotics   Review of Systems Review of Systems  All other systems reviewed and are negative.    Physical Exam Triage Vital Signs  Updated Vital Signs BP 124/88   Pulse 85   Temp 98.3 F (36.8 C)   Resp 18   LMP 12/21/2015   SpO2 99%  Physical Exam  Constitutional: She is oriented to person, place, and time. No distress.  Alert, nicely groomed  HENT:  Head: Atraumatic.  Eyes:  Conjugate gaze, no eye redness/drainage  Neck: Neck supple.  Cardiovascular: Normal rate.   Irregular rhythm  Pulmonary/Chest: She has no wheezes. She has no rales.  Lungs clear, symmetric breath sounds Patient reports increased work of breathing  Abdominal: She exhibits no distension.  Musculoskeletal: Normal range of motion.  No leg swelling  Neurological: She is alert and oriented to person, place, and time.  Skin: Skin is warm and dry.  No cyanosis  Nursing note and vitals reviewed.    UC Treatments / Results   Initial EKG with sinus rhythm, incomplete right bundle-branch block, no acute ST or T-wave changes, QRS with rightward axis of 100  Cardiac activity on the monitor suggestive of multifocal atrial tachycardia with heart rate 105, as well as spells of sinus rhythm with  PACs  Procedures Procedures (including critical care time)      None today  Discharging patient to seek higher level of care in the emergency room specifically assessment for possible pulmonary embolus, other sources of dysrhythmia.  Final Clinical Impressions(s) / UC Diagnoses   Final diagnoses:  Acute dyspnea  Irregular cardiac rhythm      Eustace MooreLaura W Kairo Laubacher, MD 01/09/16 1650

## 2016-01-09 NOTE — ED Triage Notes (Addendum)
Pt sts about 1 hour ago she became SOB and she feels like she just cant catch her breath. Denies any chest pain. sts she is having heart palpitations. No hx of same. Admits to having some chest pain earlier this am.

## 2016-01-09 NOTE — ED Notes (Signed)
Patient transported to X-ray 

## 2016-01-09 NOTE — ED Provider Notes (Signed)
[facility]EMER[facility]ENCY DEPT Provider Note   CSN: [id number] Arrival date & time: 01/09/16  1728     History   Chief Complaint Chief Complaint  Patient presents with  . Chest Pain    HPI Joanna Anderson is a 50 y.o. female.  The history is provided by the patient.  Shortness of Breath  This is a new problem. The average episode lasts 2 minutes. The problem occurs frequently.The current episode started 12 to 24 hours ago. The problem has not changed since onset.Associated symptoms include chest pain. Pertinent negatives include no fever, no headaches, no rhinorrhea, no sore throat, no ear pain, no cough, no hemoptysis, no wheezing, no vomiting, no abdominal pain, no rash, no leg pain and no leg swelling.  Chest Pain   This is a new problem. The pain is present in the substernal region. The quality of the pain is described as brief. The pain does not radiate. Associated symptoms include shortness of breath. Pertinent negatives include no abdominal pain, no back pain, no cough, no fever, no headaches, no hemoptysis, no leg pain, no palpitations and no vomiting.  Pertinent negatives for past medical history include no seizures.    Past Medical History:  Diagnosis Date  . Anemia   . Anxiety   . ASD (atrial septal defect) 06/11/2014   S/p patch repair  . Atherosclerosis of abdominal aorta (HCC) 06/11/2014   Age advanced atherosclerosis of the infrarenal aorta  . Depression   . Dysmenorrhea   . Dysuria   . Encounter for long-term (current) use of other medications   . Headache   . Hematuria   . High cholesterol   . Left knee pain   . Migraine   . Other malaise and fatigue   . Pure hyperglyceridemia   . Routine general medical examination at a health care facility   . Shoulder pain   . Vitamin D deficiency   . Vitamin D deficiency   . VSD (ventricular septal defect), multiple 06/11/2014   S/p patch repair    Patient Active Problem List   Diagnosis Date Noted  . Muscle pain  04/19/2015  . Neck pain 02/22/2015  . Abnormality of gait 02/22/2015  . Pelvic pain in female 10/02/2014  . Chest pain 06/11/2014  . ASD (atrial septal defect) 06/11/2014  . VSD (ventricular septal defect), multiple 06/11/2014  . Atherosclerosis of abdominal aorta (HCC) 06/11/2014  . Other malaise and fatigue   . Shoulder pain   . Dysmenorrhea   . Dysuria   . Hematuria   . Anxiety   . Encounter for long-term (current) use of other medications   . Vitamin D deficiency   . Pure hyperglyceridemia   . Migraine   . Depression     Past Surgical History:  Procedure Laterality Date  . c seaction    . CESAREAN SECTION    . KNEE ARTHROSCOPY W/ ACL RECONSTRUCTION AND HAMSTRING GRAFT Left   . vetricular hole      OB History    No data available       Home Medications    Prior to Admission medications   Medication Sig Start Date End Date Taking? Authorizing Provider  acetaminophen (TYLENOL) 650 MG CR tablet Take 650 mg by mouth every 8 (eight) hours as needed for pain. Reported on 07/01/2015    Historical Provider, MD  baclofen (LIORESAL) 10 MG tablet Take 2 tablets every six hours as needed. 04/21/15   Levert Feinstein, MD  clindamycin (CLINDAGEL) 1 %  gel Apply topically 2 (two) times daily.    Historical Provider, MD  clonazePAM (KLONOPIN) 0.5 MG tablet Take 1 tablet (0.5 mg total) by mouth 2 (two) times daily as needed for anxiety. Patient not taking: Reported on 01/04/2016 07/01/15   Peyton Najjaravid H Hopper, MD  ibuprofen (ADVIL,MOTRIN) 200 MG tablet Take 200 mg by mouth every 6 (six) hours as needed for moderate pain.     Historical Provider, MD  meloxicam (MOBIC) 7.5 MG tablet Take 1-2 tablets (7.5-15 mg total) by mouth daily. 10/02/14   Wallis BambergMario Mani, PA-C  pantoprazole (PROTONIX) 40 MG tablet Take 1 tablet (40 mg total) by mouth as needed. 05/30/15   Peyton Najjaravid H Hopper, MD  pregabalin (LYRICA) 75 MG capsule Take 1 capsule (75 mg total) by mouth 2 (two) times daily. 12/21/15   Ramon DredgeEdward Saguier, PA-C    promethazine (PHENERGAN) 25 MG tablet Take 1 tablet (25 mg total) by mouth every 8 (eight) hours as needed for nausea or vomiting. 12/21/15   Esperanza RichtersEdward Saguier, PA-C  rosuvastatin (CRESTOR) 5 MG tablet Take 1 tablet (5 mg total) by mouth daily. 01/04/16   Ramon DredgeEdward Saguier, PA-C  SUMAtriptan (IMITREX) 50 MG tablet Take 1 tablet (50 mg total) by mouth every 2 (two) hours as needed for migraine. May repeat in 2 hours if headache persists or recurs. 03/01/15   Levert FeinsteinYijun Yan, MD  traMADol (ULTRAM) 50 MG tablet Take 1 tablet (50 mg total) by mouth every 8 (eight) hours as needed. Can fill on January 09, 2016.(not sooner please) 01/04/16   Esperanza RichtersEdward Saguier, PA-C    Family History Family History  Problem Relation Age of Onset  . Depression Mother   . High blood pressure Mother   . Hyperlipidemia Mother   . Anxiety disorder Mother   . Hypertension Mother   . High blood pressure Father   . Hyperlipidemia Father   . Hypertension Father   . Crohn's disease Father   . Kidney disease Father   . Peripheral vascular disease Father   . Peripheral vascular disease Paternal Grandfather     Social History Social History  Substance Use Topics  . Smoking status: Former Games developermoker  . Smokeless tobacco: Never Used  . Alcohol use No     Allergies   Sulfa antibiotics   Review of Systems Review of Systems  Constitutional: Negative for chills and fever.  HENT: Negative for ear pain, rhinorrhea and sore throat.   Eyes: Negative for pain and visual disturbance.  Respiratory: Positive for shortness of breath. Negative for cough, hemoptysis and wheezing.   Cardiovascular: Positive for chest pain. Negative for palpitations and leg swelling.  Gastrointestinal: Negative for abdominal pain and vomiting.  Genitourinary: Negative for dysuria and hematuria.  Musculoskeletal: Negative for arthralgias and back pain.  Skin: Negative for color change and rash.  Neurological: Negative for seizures, syncope and headaches.  All  other systems reviewed and are negative.    Physical Exam Updated Vital Signs BP 114/67 (BP Location: Left Arm)   Pulse 69   Temp 98.5 F (36.9 C) (Oral)   Resp 16   Ht 5\' 6"  (1.676 m)   LMP 12/21/2015   SpO2 99%   Physical Exam  Constitutional: She is oriented to person, place, and time. She appears well-developed and well-nourished. No distress.  HENT:  Head: Normocephalic and atraumatic.  Eyes: Conjunctivae and EOM are normal. Pupils are equal, round, and reactive to light.  Neck: Neck supple.  Cardiovascular: Normal rate.   No murmur heard. Irregular  rhythm during episode.  Pulmonary/Chest: Effort normal and breath sounds normal. No respiratory distress.  Abdominal: Soft. There is no tenderness.  Musculoskeletal: She exhibits no edema.  Neurological: She is alert and oriented to person, place, and time.  Skin: Skin is warm and dry.  Psychiatric: She has a normal mood and affect.  Nursing note and vitals reviewed.    ED Treatments / Results  Labs (all labs ordered are listed, but only abnormal results are displayed) Labs Reviewed  BASIC METABOLIC PANEL - Abnormal; Notable for the following:       Result Value   Potassium 3.3 (*)    Glucose, Bld 114 (*)    All other components within normal limits  CBC - Abnormal; Notable for the following:    WBC 11.5 (*)    All other components within normal limits  D-DIMER, QUANTITATIVE (NOT AT [facility])  Rosezena Sensor, ED  Rosezena Sensor, ED    EKG  EKG Interpretation  Date/Time:  Monday January 09 2016 17:35:22 EDT Ventricular Rate:  69 PR Interval:    QRS Duration: 107 QT Interval:  436 QTC Calculation: 468 R Axis:   116 Text Interpretation:  Sinus rhythm Consider left atrial enlargement Right axis deviation Sinus arrhythmia NO LONGER PRESENT No significant change since last tracing Confirmed by [facility] MD, ERIN ([id number]) on 01/09/2016 6:57:42 PM Also confirmed by [facility] MD, ERIN ([id number]), editor [place]   CCT, BEVERLY ([id number])  on 01/10/2016 7:30:59 AM       Radiology Dg Chest 2 View  Result Date: 01/09/2016 CLINICAL DATA:  Acute onset of difficulty breathing. Incomplete right-sided bundle branch block, with intermittent arrhythmia. Initial encounter. EXAM: CHEST  2 VIEW COMPARISON:  CTA of the chest performed 06/05/2014 FINDINGS: The lungs are well-aerated and clear. There is no evidence of focal opacification, pleural effusion or pneumothorax. The heart is normal in size; the mediastinal contour is within normal limits. No acute osseous abnormalities are seen. Mild right convex thoracic scoliosis is noted. IMPRESSION: 1. No acute cardiopulmonary process seen. 2. Mild right convex thoracic scoliosis noted. Electronically Signed   By: Roanna Raider M.D.   On: 01/09/2016 18:34    Procedures Procedures (including critical care time)  Medications Ordered in ED Medications - No data to display   Initial Impression / Assessment and Plan / ED Course  I have reviewed the triage vital signs and the nursing notes.  Pertinent labs & imaging results that were available during my care of the patient were reviewed by me and considered in my medical decision making (see chart for details).  Clinical Course    Ms. Ruland is a 50 year old female with past medical history significant for anxiety, ASD repair, VSD repair, atherosclerosis, dyslipidemia, fatigue who presents for palpitations, shortness of breath, and chest pain.  Physical exam is unremarkable with the exception of an irregular rhythm during an episode.  EKG was obtained and demonstrates sinus rhythm with right axis deviation.  Prior EKG demonstrates sinus arrhythmia.  Labs obtained including CBC, BMP, troponin and d-dimer.  Results are significant for mild leukocytosis and borderline hypokalemia.  Wells score 0, d-dimer is negative.  Doubt PE.  Delta troponin is negative, doubt ACS.  Chest x-ray obtained, personally reviewed by me,  demonstrates no acute cardiac or pulmonary processes.  Auscultation reveals no wheezes, crackles, or rhonchi.,  With a negative chest x-ray doubt pneumonia or reactive airway disease.  Patient's vital signs remained stable throughout her ED course.  Patient is discharged home with strict  return precautions, follow up instructions including seeing her cardiologist, and educational materials.   Final Clinical Impressions(s) / ED Diagnoses   Final diagnoses:  Nonspecific chest pain  Sinus arrhythmia    New Prescriptions New Prescriptions   No medications on file     Garey Ham, MD 01/10/16 1236    Alvira Monday, MD 01/18/16 0127

## 2016-01-09 NOTE — ED Notes (Signed)
[facility] has not trucks available....[facility] contacted and en route non-emergency.

## 2016-01-10 MED FILL — traMADol HCL 50 MG TABS: 50 | 10 days supply | Qty: 30 | Fill #0

## 2016-01-16 ENCOUNTER — Other Ambulatory Visit: Payer: Self-pay | Admitting: Medical

## 2016-01-16 NOTE — Telephone Encounter (Signed)
Self. Refill on 2 medications.    Baldwin CrownLYRICA, KLONOPIN      Pharmacy: [facility]    Pt also wants to make pcp aware that she has been feeling better however over this past weekend she had a flare up and seemed that her medication wasn't working.

## 2016-01-16 NOTE — Telephone Encounter (Signed)
Pt called to check on status of med refills. She has 4 days left on Lyrica. She has 2 days left of clonazepam. Main concern is that she is not 100% with having pain under control. Yesterday in the morning she had muscle spasms and a lot of pain (arms, legs, and buttocks).   Pt not sure if she needs to come in again or if nurse can call her to discuss issues. She's had a really bad last 2 days. She said she can't stand the pain anymore. She said that she had tramadol but could only take 1 a day.  Call back #: [phone]  Pt again stated [facility] [place] [address] [facility] [address]

## 2016-01-16 NOTE — Telephone Encounter (Signed)
Called pt, she states she thinks she is experiencing fibromyalgia flare as noted in previous note. Reports pain in her legs, hips, backs of arms. She felt better for several days after last appt and went to the fair over the weekend and walked around and felt fine. Yesterday morning she awoke w/ muscle spasms in her legs, and her pain has progressively worsened since then. States 'I'm just miserable. I feel like I could cry from the pain.' She has baclofen for muscle spasms, but does not like to use it because 'I don't feel like myself' when she takes it. She was seen in ED 1 week ago for CP/SOB, which she currently denies. Scheduled acute appointment w/ Ramon DredgeEdward on Thursday, pt declined sooner appt (offered several days and times and offered to schedule w/ another provider). Instructed her to go to ED if symptoms worsen or if she develops CP, SOB, or palpitations. She is requesting refill of Klonopin and Lyrica, but said to hold filling if Ramon Dredgedward is going to adjust them at appt.  -- Last OV: 01/04/16  UDS: Not on file  LYRICA Last filled: 12/21/15, #60, 0 RF Sig: Take 1 capsule (75 mg total) by mouth 2 (two) times daily.  KLONOPIN Last filled: 07/01/15, #60, 0 RF Sig: Take 1 tablet (0.5 mg total) by mouth 2 (two) times daily as needed for anxiety.

## 2016-01-19 ENCOUNTER — Encounter: Payer: Self-pay | Admitting: Medical

## 2016-01-19 ENCOUNTER — Ambulatory Visit (INDEPENDENT_AMBULATORY_CARE_PROVIDER_SITE_OTHER): Payer: 59 | Admitting: Medical

## 2016-01-19 VITALS — BP 104/60 | HR 70 | Temp 98.3°F | Ht 66.0 in | Wt 167.8 lb

## 2016-01-19 DIAGNOSIS — M797 Fibromyalgia: Secondary | ICD-10-CM

## 2016-01-19 DIAGNOSIS — F411 Generalized anxiety disorder: Secondary | ICD-10-CM

## 2016-01-19 MED ORDER — CLONAZEPAM 0.5 MG PO TABS: 0.5000 mg | ORAL_TABLET | Freq: Two times a day (BID) | ORAL | 0 refills | Status: DC | PRN

## 2016-01-19 MED ORDER — PREGABALIN 150 MG PO CAPS: 150.0000 mg | ORAL_CAPSULE | Freq: Two times a day (BID) | ORAL | 0 refills | Status: DC

## 2016-01-19 MED FILL — LYRICA 150 MG CAPSULE: 150 | 30 days supply | Qty: 60 | Fill #0

## 2016-01-19 MED FILL — clonazePAM 0.5 MG TABS: 0.5 | 30 days supply | Qty: 60 | Fill #0

## 2016-01-19 NOTE — Patient Instructions (Addendum)
For your fibromyalgia pain will increase your lyrica to 150 mg twice daily.  You can use tramadol up to 1 tab every 8 hours as need moderate to severe pain.(you have 19 tabs remaining)Call me in one week to let me know impact on increasing the lyrica. Will decide at that point if we need to refill tramadol.(Discussed use of tramadol with Dr. Abner GreenspanBlyth)  Continue other meds same.  Follow up in 2-3 weeks or as needed(call me as well if needed)  For patient anxiety I am refilling her clonazepam.(prior rx in march with one refill lasted about 7 months)

## 2016-01-19 NOTE — Progress Notes (Signed)
Subjective:    Patient ID: Joanna Anderson, female    DOB: 1966-03-16, 50 y.o.   MRN: [id number]  HPI  Pt in states she feels bad.  Pt states after she went to fair and walked 8 hours this past saturday. Since then felt achy in body and tired. In addition her menstrual cycle just started.  Pt states her she has achiness all over her body. Like her fibromyalgia. She is teary eyed and frustrated. She states this is pattern that she has had in the past. Pt had 3 flares of severe pain recently with brief periods of relief. No cough, no fever, no chills.  Pt used to be on neurontin. I had switched her to lyrica. Pt feels like this is not helping a lot most recently. But when first started she thought it helped a lot.  Pt has been on been on tramadol intermittently. She has 19 tabs left. She got rx filled on the 10 th.  Pt took 1/2 tab of tramadol with lyrica this morning.    Pt has used baclofen in the past with severe body aches. Usually just sedates her.   Pt takes occasional tylenol and ibuprofen for pain. Pt had reaction to cymbalta. She had severe reaction. But told conversion disorder. Pt does not want to be any ssri. In past was on effexor but made her sick as well.    On Monday on October 9th she had heart palpitations at work. This was happening all day. Pt had sinus arrythmia in the ED(but she states that ekg was not scanned). She states nothing otherwise found. Then on Tuesday that stopped. No reoccurence.  Pt has no SOB or wheezing.   Pt has seen rheumatologsit just recently. They said no condition other than fibromyalgia per pt. Pt also had extensive work up neurogloist. ALS, MS and  other conditions ruled out per pt.    Temperature drops/cold weather worsens her pain. Mobic may have caused chest pain in the past.    Review of Systems  Constitutional: Negative for chills, fatigue and fever.       No flu like symptoms. Note her muscle aches are baseline/fibromyalgia  like per pt.  Respiratory: Negative for cough, chest tightness, shortness of breath and wheezing.   Cardiovascular: Negative for chest pain and palpitations.  Musculoskeletal: Positive for myalgias.  Skin: Negative for rash.  Neurological: Negative for dizziness, speech difficulty, weakness, light-headedness, numbness and headaches.       Diffuse severe pain.  Hematological: Negative for adenopathy. Does not bruise/bleed easily.  Psychiatric/Behavioral: Negative for behavioral problems, confusion and suicidal ideas. The patient is nervous/anxious.        Frustrated and sad over pain.     Past Medical History:  Diagnosis Date  . Anemia   . Anxiety   . ASD (atrial septal defect) 06/11/2014   S/p patch repair  . Atherosclerosis of abdominal aorta (HCC) 06/11/2014   Age advanced atherosclerosis of the infrarenal aorta  . Depression   . Dysmenorrhea   . Dysuria   . Encounter for long-term (current) use of other medications   . Headache   . Hematuria   . High cholesterol   . Left knee pain   . Migraine   . Other malaise and fatigue   . Pure hyperglyceridemia   . Routine general medical examination at a health care facility   . Shoulder pain   . Vitamin D deficiency   . Vitamin D deficiency   .  VSD (ventricular septal defect), multiple 06/11/2014   S/p patch repair     Social History   Social History  . Marital status: Married    Spouse name: N/A  . Number of children: 4  . Years of education: College   Occupational History  . RN     [employer]   Social History Main Topics  . Smoking status: Former Games developer  . Smokeless tobacco: Never Used  . Alcohol use No  . Drug use: No  . Sexual activity: Not on file   Other Topics Concern  . Not on file   Social History Narrative   Lives at home with husband and children.   Right-handed.    Past Surgical History:  Procedure Laterality Date  . c seaction    . CESAREAN SECTION    . KNEE ARTHROSCOPY W/ ACL RECONSTRUCTION AND  HAMSTRING GRAFT Left   . vetricular hole      Family History  Problem Relation Age of Onset  . Depression Mother   . High blood pressure Mother   . Hyperlipidemia Mother   . Anxiety disorder Mother   . Hypertension Mother   . High blood pressure Father   . Hyperlipidemia Father   . Hypertension Father   . Crohn's disease Father   . Kidney disease Father   . Peripheral vascular disease Father   . Peripheral vascular disease Paternal Grandfather     Allergies  Allergen Reactions  . Banana Other (See Comments)    Headaches  . Nsaids Nausea Only and Other (See Comments)    Also caused stomach ulcers (can only tolerate in limited doses)  . Other Other (See Comments)    SSRI(s): Makes the patient feel "disconnected" Tricyclic antidepressants- Muscle spasms Sulfates- Itching  . Sulfa Antibiotics Hives    Current Outpatient Prescriptions on File Prior to Visit  Medication Sig Dispense Refill  . acetaminophen (TYLENOL) 650 MG CR tablet Take 650 mg by mouth every 8 (eight) hours as needed for pain. Reported on 07/01/2015    . baclofen (LIORESAL) 10 MG tablet Take 2 tablets every six hours as needed. (Patient taking differently: Take 10 mg by mouth every 6 (six) hours as needed for muscle spasms. Take 2 tablets every six hours as needed.) 240 tablet 1  . clindamycin (CLINDAGEL) 1 % gel Apply topically 2 (two) times daily.    . clonazePAM (KLONOPIN) 0.5 MG tablet Take 1 tablet (0.5 mg total) by mouth 2 (two) times daily as needed for anxiety. 60 tablet 1  . ibuprofen (ADVIL,MOTRIN) 200 MG tablet Take 200 mg by mouth every 6 (six) hours as needed for moderate pain.     . meloxicam (MOBIC) 7.5 MG tablet Take 1-2 tablets (7.5-15 mg total) by mouth daily. 60 tablet 1  . pantoprazole (PROTONIX) 40 MG tablet Take 1 tablet (40 mg total) by mouth as needed. (Patient taking differently: Take 40 mg by mouth daily as needed (indigestion). ) 90 tablet 1  . pregabalin (LYRICA) 75 MG capsule Take 1  capsule (75 mg total) by mouth 2 (two) times daily. 60 capsule 0  . promethazine (PHENERGAN) 25 MG tablet Take 1 tablet (25 mg total) by mouth every 8 (eight) hours as needed for nausea or vomiting. 20 tablet 0  . rosuvastatin (CRESTOR) 5 MG tablet Take 1 tablet (5 mg total) by mouth daily. 30 tablet 2  . SUMAtriptan (IMITREX) 50 MG tablet Take 1 tablet (50 mg total) by mouth every 2 (two) hours as needed  for migraine. May repeat in 2 hours if headache persists or recurs. (Patient taking differently: Take 50 mg by mouth once. May repeat in 2 hours if headache persists or recurs) 15 tablet 6  . traMADol (ULTRAM) 50 MG tablet Take 1 tablet (50 mg total) by mouth every 8 (eight) hours as needed. Can fill on January 09, 2016.(not sooner please) (Patient taking differently: Take 50 mg by mouth every 8 (eight) hours as needed (for pain). Can fill on January 09, 2016.(not sooner please)) 30 tablet 0   No current facility-administered medications on file prior to visit.     BP 104/60   Pulse 70   Temp 98.3 F (36.8 C) (Oral)   Ht 5\' 6"  (1.676 m)   Wt 167 lb 12.8 oz (76.1 kg)   LMP 01/19/2016   SpO2 97%   BMI 27.08 kg/m       Objective:   Physical Exam  General Mental Status- Alert. General Appearance- Not in acute distress.   Skin General: Color- Normal Color. Moisture- Normal Moisture.  Neck Carotid Arteries- Normal color. Moisture- Normal Moisture. No carotid bruits. No JVD.  Chest and Lung Exam Auscultation: Breath Sounds:-Normal.  Cardiovascular Auscultation:Rythm- Regular. Murmurs & Other Heart Sounds:Auscultation of the heart reveals- No Murmurs.  Abdomen Inspection:-Inspeection Normal. Palpation/Percussion:Note:No mass. Palpation and Percussion of the abdomen reveal- Non Tender, Non Distended + BS, no rebound or guarding.   Neurologic Cranial Nerve exam:- CN III-XII intact(No nystagmus), symmetric smile. Drift Test:- No drift. Romberg Exam:- Negative.  Heal to Toe Gait  exam:-Normal. Finger to Nose:- Normal/Intact Strength:- 5/5 equal and symmetric strength both upper and lower extremities.  Back- diffuse symmetric parapaspinal tenderness to palpation. No mid spinal pain on palpation. (Pt states these are not only areas of pain. She states every square inch hurts)     Assessment & Plan:  For your fibromyalgia pain will increase your lyrica to 150 mg twice daily. Reminded ppt to stop the  neurontin as I have already done.  You can use tramadol up to 1 tab every 8 hours as need moderate to severe pain.(you have 19 tabs remaining)Call me in one week to let me know impact on increasing the lyrica. Will decide at that point if we need to refill tramadol.(Discussed use of tramadol with Dr. Abner Greenspan)  Continue other meds same.  Follow up in 2-3 weeks or as needed(call me as well if needed)

## 2016-01-19 NOTE — Progress Notes (Signed)
Pre visit review using our clinic review tool, if applicable. No additional management support is needed unless otherwise documented below in the visit note./hsm  

## 2016-01-22 NOTE — Telephone Encounter (Signed)
Pt has history of depression, anxiety and fibromyalgia. For fibromyalgia, she is currently on lyrica. Just increased dose. Dr. Alwyn RenHopper was former pcp. She has had tramadol in the past. I have written her limited tramadol on October 4th. So far it has been challenging to control her pain. Also she can't tolerate ssri in the past. Wanted to get your opinion.  I think she may have to rely on tramadol for pain relief. What I gather tramadol can be effective but question if officially indicated for fibromyalgia.   Pt has signed controlled med contract and had drug screen. Tramadol showed on screen as expected.

## 2016-01-23 NOTE — Telephone Encounter (Signed)
Tramadol is fine --- as long as she is using limited amount and not being unreasonable with it  You could offer pain management if pain is uncontrolled

## 2016-01-25 ENCOUNTER — Telehealth: Payer: Self-pay | Admitting: Medical

## 2016-01-25 MED ORDER — TRAMADOL HCL 50 MG PO TABS: 50.0000 mg | ORAL_TABLET | Freq: Three times a day (TID) | ORAL | 0 refills | Status: DC | PRN

## 2016-01-25 NOTE — Telephone Encounter (Signed)
°  Relationship to patient: Self  Can be reached: [phone]   Reason for call: FYI: Patient called to inform Provider that she is not 100% better but she is feeling better. States the medication is helping and she is having no side effects. She is taking the Tramadol every 8 hours and only has two left. If the Tramadol can be refilled she would like to have it.

## 2016-01-25 NOTE — Telephone Encounter (Signed)
I will write one week of tramadol using it 3 times a day. I have sent Dr. Laury AxonLowne a note to see if she agrees with giving her using  tramadol tid use on regular basis.   Will notify pt what Dr. Laury AxonLowne decides.

## 2016-01-25 NOTE — Telephone Encounter (Signed)
Dr. Laury AxonLowne,  Pt has been using tramadol 50 mg tid for her fibromyalgia as I mentioned in last note to you. She states that she is doing well with this. Not 100% improved but significantly better. She is also on lyrica.  Are you ok with tid use tramadol and with giving her 90 tabs a month?  Thanks, Ramon DredgeEdward

## 2016-01-26 MED FILL — traMADol HCL 50 MG TABS: 50 | 7 days supply | Qty: 21 | Fill #0

## 2016-01-26 NOTE — Telephone Encounter (Signed)
Left message for patient to call back  

## 2016-01-26 NOTE — Telephone Encounter (Signed)
As long as it is not abused and guidelines are followed , im fine with it

## 2016-01-26 NOTE — Telephone Encounter (Signed)
Called patient with directions for Tramadol. Patient agreed.

## 2016-01-26 NOTE — Telephone Encounter (Signed)
I spoke with the patient and she states that she will come in for a follow up with E. Saguier on 02/01/16. Patient states that the medication is helping her and she can tell a difference when she does not take it. She states that she will stay active to help her feel better as well.

## 2016-01-26 NOTE — Telephone Encounter (Signed)
I talked with Dr. Sabino DonovanLowne(she is ok with TID as needed for severe pain. )When she runs out of the 7 days supply of tramadol that we sent in today will prescribe 90 tabs. But be clear/advise that we need to follow contract and use as directed. Will not refill any sooner than 30 days.   Continue the lyrica.

## 2016-02-01 ENCOUNTER — Encounter: Payer: Self-pay | Admitting: Medical

## 2016-02-01 ENCOUNTER — Ambulatory Visit (INDEPENDENT_AMBULATORY_CARE_PROVIDER_SITE_OTHER): Payer: 59 | Admitting: Medical

## 2016-02-01 VITALS — BP 100/70 | HR 78 | Temp 98.3°F | Ht 66.0 in | Wt 169.0 lb

## 2016-02-01 DIAGNOSIS — M797 Fibromyalgia: Secondary | ICD-10-CM | POA: Diagnosis not present

## 2016-02-01 MED ORDER — TRAMADOL HCL 50 MG PO TABS: 50.0000 mg | ORAL_TABLET | Freq: Three times a day (TID) | ORAL | 0 refills | Status: DC | PRN

## 2016-02-01 MED FILL — traMADol HCL 50 MG TABS: 50 | 30 days supply | Qty: 90 | Fill #0

## 2016-02-01 NOTE — Progress Notes (Signed)
Subjective:    Patient ID: Joanna Anderson, female    DOB: May 04, 1965, 50 y.o.   MRN: [id number]  HPI  Pt in states she is feeling improved with fibromyalgia type pain. Today is good day.   Yesterday was a bad day and she missed work. Lower ext were achy and back. But those symptoms resolved. No fever,no chills today.  Pt feels good today. Compared to her baseline pain.  Pt is on now on lyrica 150 mg twice a day. Pt is also on tramadol. She sometimes uses baclofen but decided not to take recenlty since it sedates her.  Recently trying to exercise 5 days a week.  Pt missed one day of work yesterday due to fibromyalgia.   Review of Systems  Constitutional: Negative for chills, fatigue and fever.  HENT: Negative for congestion.   Respiratory: Negative for cough, chest tightness, shortness of breath and wheezing.   Cardiovascular: Negative for chest pain and palpitations.  Gastrointestinal: Negative for abdominal pain, blood in stool, constipation and diarrhea.  Musculoskeletal: Positive for myalgias. Negative for arthralgias, back pain, joint swelling and neck stiffness.       Some mild faint myalgia today consistent with fibromyalgia.  Skin: Negative for rash.  Neurological: Negative for seizures, speech difficulty, weakness, numbness and headaches.       Hx of migraine ha 2-3 times of month. But recently more controlled.   Hematological: Negative for adenopathy. Does not bruise/bleed easily.  Psychiatric/Behavioral: Negative for behavioral problems, confusion and suicidal ideas. The patient is nervous/anxious.        Hx of anxiety but seems very calm today.    Past Medical History:  Diagnosis Date  . Anemia   . Anxiety   . ASD (atrial septal defect) 06/11/2014   S/p patch repair  . Atherosclerosis of abdominal aorta (HCC) 06/11/2014   Age advanced atherosclerosis of the infrarenal aorta  . Depression   . Dysmenorrhea   . Dysuria   . Encounter for long-term (current) use of  other medications   . Headache   . Hematuria   . High cholesterol   . Left knee pain   . Migraine   . Other malaise and fatigue   . Pure hyperglyceridemia   . Routine general medical examination at a health care facility   . Shoulder pain   . Vitamin D deficiency   . Vitamin D deficiency   . VSD (ventricular septal defect), multiple 06/11/2014   S/p patch repair     Social History   Social History  . Marital status: Married    Spouse name: N/A  . Number of children: 4  . Years of education: College   Occupational History  . RN     [employer]   Social History Main Topics  . Smoking status: Former Games developermoker  . Smokeless tobacco: Never Used  . Alcohol use No  . Drug use: No  . Sexual activity: Not on file   Other Topics Concern  . Not on file   Social History Narrative   Lives at home with husband and children.   Right-handed.    Past Surgical History:  Procedure Laterality Date  . c seaction    . CESAREAN SECTION    . KNEE ARTHROSCOPY W/ ACL RECONSTRUCTION AND HAMSTRING GRAFT Left   . vetricular hole      Family History  Problem Relation Age of Onset  . Depression Mother   . High blood pressure Mother   . Hyperlipidemia  Mother   . Anxiety disorder Mother   . Hypertension Mother   . High blood pressure Father   . Hyperlipidemia Father   . Hypertension Father   . Crohn's disease Father   . Kidney disease Father   . Peripheral vascular disease Father   . Peripheral vascular disease Paternal Grandfather     Allergies  Allergen Reactions  . Banana Other (See Comments)    Headaches  . Nsaids Nausea Only and Other (See Comments)    Also caused stomach ulcers (can only tolerate in limited doses)  . Other Other (See Comments)    SSRI(s): Makes the patient feel "disconnected" Tricyclic antidepressants- Muscle spasms Sulfates- Itching  . Sulfa Antibiotics Hives    Current Outpatient Prescriptions on File Prior to Visit  Medication Sig Dispense Refill  .  acetaminophen (TYLENOL) 650 MG CR tablet Take 650 mg by mouth every 8 (eight) hours as needed for pain. Reported on 07/01/2015    . baclofen (LIORESAL) 10 MG tablet Take 2 tablets every six hours as needed. (Patient taking differently: Take 10 mg by mouth every 6 (six) hours as needed for muscle spasms. Take 2 tablets every six hours as needed.) 240 tablet 1  . clindamycin (CLINDAGEL) 1 % gel Apply topically 2 (two) times daily.    . clonazePAM (KLONOPIN) 0.5 MG tablet Take 1 tablet (0.5 mg total) by mouth 2 (two) times daily as needed for anxiety. 60 tablet 0  . ibuprofen (ADVIL,MOTRIN) 200 MG tablet Take 200 mg by mouth every 6 (six) hours as needed for moderate pain.     . meloxicam (MOBIC) 7.5 MG tablet Take 1-2 tablets (7.5-15 mg total) by mouth daily. 60 tablet 1  . pantoprazole (PROTONIX) 40 MG tablet Take 1 tablet (40 mg total) by mouth as needed. (Patient taking differently: Take 40 mg by mouth daily as needed (indigestion). ) 90 tablet 1  . pregabalin (LYRICA) 150 MG capsule Take 1 capsule (150 mg total) by mouth 2 (two) times daily. 60 capsule 0  . promethazine (PHENERGAN) 25 MG tablet Take 1 tablet (25 mg total) by mouth every 8 (eight) hours as needed for nausea or vomiting. 20 tablet 0  . rosuvastatin (CRESTOR) 5 MG tablet Take 1 tablet (5 mg total) by mouth daily. 30 tablet 2  . SUMAtriptan (IMITREX) 50 MG tablet Take 1 tablet (50 mg total) by mouth every 2 (two) hours as needed for migraine. May repeat in 2 hours if headache persists or recurs. (Patient taking differently: Take 50 mg by mouth once. May repeat in 2 hours if headache persists or recurs) 15 tablet 6  . traMADol (ULTRAM) 50 MG tablet Take 1 tablet (50 mg total) by mouth every 8 (eight) hours as needed for severe pain. 21 tablet 0   No current facility-administered medications on file prior to visit.     BP 100/70 (BP Location: Left Arm, Patient Position: Sitting)   Pulse 78   Temp 98.3 F (36.8 C) (Oral)   Ht 5\' 6"   (1.676 m)   Wt 169 lb (76.7 kg)   LMP 01/19/2016   SpO2 98%   BMI 27.28 kg/m      Objective:   Physical Exam  General- No acute distress. Pleasant patient. Neck- Full range of motion, no jvd Lungs- Clear, even and unlabored. Heart- regular rate and rhythm. Neurologic- CNII- XII grossly intact.  Abdomen- soft, nt, nd, +bs. No rebound or guarding. Back- faint symmetric area of tenderness on both sides. No cva  pain on palpation.     Assessment & Plan:  You are doing much better in my opinion with your fibromyalgia pain. I want you to continue the lyrica and tramadol. Do recommend exercising but build up slowly so not to cause excess soreness.  I do recommend coming in for a CPE in within 2 months but a day separate from fibromyalgia visit.  Refill the tramadol rx today.  Pt may send me fmla form intermittent and will fill out for fibromyalgia. Could add migraine as well.

## 2016-02-01 NOTE — Progress Notes (Signed)
Pre visit review using our clinic review tool, if applicable. No additional management support is needed unless otherwise documented below in the visit note. 

## 2016-02-01 NOTE — Patient Instructions (Addendum)
You are doing much better in my opinion with your fibromyalgia pain. I want you to continue the lyrica and tramadol. Do recommend exercising but build up slowly so not to cause excess soreness.  I do recommend coming in for a CPE in within next 2 months but a day separate from fibromyalgia visit.(come in fasting that day).

## 2016-02-20 ENCOUNTER — Telehealth: Payer: Self-pay | Admitting: Medical

## 2016-02-20 ENCOUNTER — Other Ambulatory Visit: Payer: Self-pay | Admitting: Medical

## 2016-02-20 NOTE — Telephone Encounter (Signed)
Filled out pt fmla form today. Have her  follow up in one month. 30 minute follow up. Please fax the fmla form.   Notify did not fill out for migraine but for fibromyalgia. Could do for migraines in future if needed. More familiar with her fibromyalgia since I have treated.

## 2016-02-20 NOTE — Telephone Encounter (Signed)
Would recommend she have appointment on Wed am. She need 8 am and 8:15 appointment. She need 30 minute appointment. Offer her this Wednesday.

## 2016-02-20 NOTE — Telephone Encounter (Signed)
I left a message for the patient to call back to see if she had been out any days previously to the 02/01/16 office visit. Joanna Anderson is filling out paperwork as of 02/20/16.

## 2016-02-20 NOTE — Telephone Encounter (Signed)
Patient returned your phone call.

## 2016-02-20 NOTE — Telephone Encounter (Signed)
Patient reports that she had a flare up over the weekend and she states that she felt like a corpse and she states that the pain is unbearable. Pt states that she is not aware of when it comes and the pain is very severe. Pt states that she worked Friday and she has felt bad since then Saturday. Pt states that her hip joints are hurting so bad that the pain is unbearable. Pt states that she does not know how to describe the pain. She wants you to be aware of the situation.   She also wants know if she needs to come in the office to get her lyrica adjusted to the next dose.   She states that the only day that she has missed previously only on 01/31/16.    Please advise.

## 2016-02-20 NOTE — Telephone Encounter (Signed)
Patient is calling to follow up on her FMLA paperwork. She states that she received a letter stating if it is not faxed by tomorrow it will be denied. Please advise.   Patient phone: [phone]

## 2016-02-21 NOTE — Telephone Encounter (Signed)
Please have the patient come in for a follow up.

## 2016-02-21 NOTE — Telephone Encounter (Signed)
Appt scheduled for 4:00pm tomorrow.

## 2016-02-22 ENCOUNTER — Encounter: Payer: Self-pay | Admitting: Medical

## 2016-02-22 ENCOUNTER — Ambulatory Visit (INDEPENDENT_AMBULATORY_CARE_PROVIDER_SITE_OTHER): Payer: 59 | Admitting: Medical

## 2016-02-22 VITALS — BP 102/62 | HR 72 | Temp 98.1°F | Ht 66.0 in | Wt 166.2 lb

## 2016-02-22 DIAGNOSIS — M797 Fibromyalgia: Secondary | ICD-10-CM

## 2016-02-22 DIAGNOSIS — M25551 Pain in right hip: Secondary | ICD-10-CM | POA: Diagnosis not present

## 2016-02-22 MED ORDER — PREGABALIN 150 MG PO CAPS: ORAL_CAPSULE | ORAL | 0 refills | Status: DC

## 2016-02-22 MED FILL — LYRICA 150 MG CAPSULE: 150 | 30 days supply | Qty: 90 | Fill #0

## 2016-02-22 NOTE — Progress Notes (Signed)
Subjective:    Patient ID: Joanna Anderson, female    DOB: 26-May-1965, 50 y.o.   MRN: [id number]  HPI  Pt in for follow up.  Pt  Fibromyalgia pain recently has been worse(diffuse body pain but also some rt hip pain). She has been having some days with severe flares. Pt wonders if she should go back to neurontin or have increase on the lyrica dose. She had wanted to switch to lyrica when I discussed this with her in the past.  Pt had good response initially early on with lyrica. She is also on tramadol.  Pt states one day Saturday had severe hip pain rt side. Pain less since then but still present. She mentioned pain may have started after using elliptical.  Pt thought almost felt like iliotibial pain. She had that years ago when she used to jog a lot.     Review of Systems  Constitutional: Negative for chills, fatigue and fever.  Respiratory: Negative for cough, chest tightness and shortness of breath.   Cardiovascular: Negative for chest pain and palpitations.  Gastrointestinal: Negative for anal bleeding.  Musculoskeletal:       Diffuse body pain and hip pain.  Neurological: Negative for dizziness and headaches.  Hematological: Negative for adenopathy. Does not bruise/bleed easily.  Psychiatric/Behavioral: Negative for behavioral problems and confusion.    Past Medical History:  Diagnosis Date  . Anemia   . Anxiety   . ASD (atrial septal defect) 06/11/2014   S/p patch repair  . Atherosclerosis of abdominal aorta (HCC) 06/11/2014   Age advanced atherosclerosis of the infrarenal aorta  . Depression   . Dysmenorrhea   . Dysuria   . Encounter for long-term (current) use of other medications   . Headache   . Hematuria   . High cholesterol   . Left knee pain   . Migraine   . Other malaise and fatigue   . Pure hyperglyceridemia   . Routine general medical examination at a health care facility   . Shoulder pain   . Vitamin D deficiency   . Vitamin D deficiency   . VSD  (ventricular septal defect), multiple 06/11/2014   S/p patch repair     Social History   Social History  . Marital status: Married    Spouse name: N/A  . Number of children: 4  . Years of education: College   Occupational History  . RN     [employer]   Social History Main Topics  . Smoking status: Former Games developer  . Smokeless tobacco: Never Used  . Alcohol use No  . Drug use: No  . Sexual activity: Not on file   Other Topics Concern  . Not on file   Social History Narrative   Lives at home with husband and children.   Right-handed.    Past Surgical History:  Procedure Laterality Date  . c seaction    . CESAREAN SECTION    . KNEE ARTHROSCOPY W/ ACL RECONSTRUCTION AND HAMSTRING GRAFT Left   . vetricular hole      Family History  Problem Relation Age of Onset  . Depression Mother   . High blood pressure Mother   . Hyperlipidemia Mother   . Anxiety disorder Mother   . Hypertension Mother   . High blood pressure Father   . Hyperlipidemia Father   . Hypertension Father   . Crohn's disease Father   . Kidney disease Father   . Peripheral vascular disease Father   .  Peripheral vascular disease Paternal Grandfather     Allergies  Allergen Reactions  . Banana Other (See Comments)    Headaches  . Nsaids Nausea Only and Other (See Comments)    Also caused stomach ulcers (can only tolerate in limited doses)  . Other Other (See Comments)    SSRI(s): Makes the patient feel "disconnected" Tricyclic antidepressants- Muscle spasms Sulfates- Itching  . Sulfa Antibiotics Hives    Current Outpatient Prescriptions on File Prior to Visit  Medication Sig Dispense Refill  . acetaminophen (TYLENOL) 650 MG CR tablet Take 650 mg by mouth every 8 (eight) hours as needed for pain. Reported on 07/01/2015    . baclofen (LIORESAL) 10 MG tablet Take 2 tablets every six hours as needed. (Patient taking differently: Take 10 mg by mouth every 6 (six) hours as needed for muscle spasms. Take 2  tablets every six hours as needed.) 240 tablet 1  . clindamycin (CLINDAGEL) 1 % gel Apply topically 2 (two) times daily.    . clonazePAM (KLONOPIN) 0.5 MG tablet Take 1 tablet (0.5 mg total) by mouth 2 (two) times daily as needed for anxiety. 60 tablet 0  . ibuprofen (ADVIL,MOTRIN) 200 MG tablet Take 200 mg by mouth every 6 (six) hours as needed for moderate pain.     . meloxicam (MOBIC) 7.5 MG tablet Take 1-2 tablets (7.5-15 mg total) by mouth daily. 60 tablet 1  . pantoprazole (PROTONIX) 40 MG tablet Take 1 tablet (40 mg total) by mouth as needed. (Patient taking differently: Take 40 mg by mouth daily as needed (indigestion). ) 90 tablet 1  . promethazine (PHENERGAN) 25 MG tablet Take 1 tablet (25 mg total) by mouth every 8 (eight) hours as needed for nausea or vomiting. 20 tablet 0  . rosuvastatin (CRESTOR) 5 MG tablet Take 1 tablet (5 mg total) by mouth daily. 30 tablet 2  . SUMAtriptan (IMITREX) 50 MG tablet Take 1 tablet (50 mg total) by mouth every 2 (two) hours as needed for migraine. May repeat in 2 hours if headache persists or recurs. (Patient taking differently: Take 50 mg by mouth once. May repeat in 2 hours if headache persists or recurs) 15 tablet 6  . traMADol (ULTRAM) 50 MG tablet Take 1 tablet (50 mg total) by mouth every 8 (eight) hours as needed for severe pain. 90 tablet 0   No current facility-administered medications on file prior to visit.     BP 102/62 (BP Location: Left Arm, Patient Position: Sitting, Cuff Size: Normal)   Pulse 72   Temp 98.1 F (36.7 C) (Oral)   Ht 5\' 6"  (1.676 m)   Wt 166 lb 3.2 oz (75.4 kg)   SpO2 98%   BMI 26.83 kg/m       Objective:   Physical Exam  General- No acute distress. Pleasant patient. Neck- Full range of motion, no jvd Lungs- Clear, even and unlabored. Heart- regular rate and rhythm. Neurologic- CNII- XII grossly intact. Rt hip- pain on palpation of hip/trochanter area.  Note exam and interview somewhat expidited today so  she could make it to our pharmacy before they closed.       Assessment & Plan:  For your fibromyalgia will rx lyrica and increase to 150 mg tid. Continue tramadol as well.  If you want to hold crestor for next 2 weeks to see if this helps that is ok.  You can get hip xray today or next week if you want.  Can refer to orthopedist or sports  medicine to evaluate hip  if you want.(she deferred presently)  Follow up in 3 weeks or as needed   Dornell Grasmick, Ramon DredgeEdward, VF CorporationPA-C

## 2016-02-22 NOTE — Patient Instructions (Addendum)
For your fibromyalgia will rx lyrica and increase to 150 mg tid. Continue tramadol as well.  If you want to hold crestor for next 2 weeks to see if this helps that is ok.  You can get hip xray today or next week if you want.  Can refer to orthopedist or sports medicine to evaluatehip  if you want. (she deferred presently)  Follow up in 3 weeks or as needed

## 2016-02-22 NOTE — Progress Notes (Signed)
Pre visit review using our clinic review tool, if applicable. No additional management support is needed unless otherwise documented below in the visit note. 

## 2016-03-02 ENCOUNTER — Telehealth: Payer: Self-pay | Admitting: Medical

## 2016-03-02 NOTE — Telephone Encounter (Signed)
Please advise 

## 2016-03-02 NOTE — Telephone Encounter (Signed)
Caller name:Nilda Kannan Relationship to patient: Can be [phone] Pharmacy:  Reason for call:requesting refill on tramadol

## 2016-03-03 MED ORDER — TRAMADOL HCL 50 MG PO TABS: 50.0000 mg | ORAL_TABLET | Freq: Three times a day (TID) | ORAL | 0 refills | Status: DC | PRN

## 2016-03-03 NOTE — Telephone Encounter (Signed)
rx tramadol printed. 

## 2016-03-03 NOTE — Telephone Encounter (Signed)
Since not in office on Friday  saw refill request for tramadol on Saturday(request taken late Friday). Hopefully she was not completely out.  Printed tramadol rx. I should be in on Monday. Feeling some better. By chance if I am not in to sign script on Monday then shred rx and get other provider to sign. Thanks,

## 2016-03-05 MED FILL — traMADol HCL 50 MG TABS: 50 | 30 days supply | Qty: 90 | Fill #0

## 2016-03-05 NOTE — Telephone Encounter (Signed)
Rx faxed to pharmacy for the patient.  I left a message for the patient that [facility].

## 2016-03-14 ENCOUNTER — Ambulatory Visit (INDEPENDENT_AMBULATORY_CARE_PROVIDER_SITE_OTHER): Payer: 59 | Admitting: Medical

## 2016-03-14 ENCOUNTER — Encounter: Payer: Self-pay | Admitting: Medical

## 2016-03-14 VITALS — BP 114/72 | HR 72 | Temp 98.2°F | Resp 16 | Ht 66.0 in | Wt 163.0 lb

## 2016-03-14 DIAGNOSIS — M797 Fibromyalgia: Secondary | ICD-10-CM | POA: Diagnosis not present

## 2016-03-14 DIAGNOSIS — T887XXA Unspecified adverse effect of drug or medicament, initial encounter: Secondary | ICD-10-CM

## 2016-03-14 DIAGNOSIS — T50905A Adverse effect of unspecified drugs, medicaments and biological substances, initial encounter: Secondary | ICD-10-CM

## 2016-03-14 MED FILL — GABAPENTIN 300 MG CAPSULE: 300 | 90 days supply | Qty: 360 | Fill #0

## 2016-03-14 NOTE — Addendum Note (Signed)
Addended by: Gwenevere AbbotSAGUIER, Hayzel Ruberg M on: 03/14/2016 01:08 PM   Modules accepted: Orders

## 2016-03-14 NOTE — Progress Notes (Signed)
Subjective:    Patient ID: Joanna Anderson, female  Joanna Anderson  DOB: 05-Jul-1965, 50 y.o.   MRN: [id number]  HPI  Pt in states with  increased dose of lyrica she feels that her pain level is much better. But she feels little spacey with the medication. Pt thinks she may be more moody at work and more defensive in her attitude. Then one day last week she felt dizzy last week and had to call out from work.  She states energy level improved.  Pt manager had meeting with her. Pt states her supervisor had vague critic. They told he at times she seems off. They did not clarify if this was just recent or if has been long standing problem. No examples given to pt.  So pt correlates work complaints with lyrica.   LMP- approxime one month ago. She feels that about to start.  Pt in past was on gabapentin. She has some at home.   Pt has tramadol rx.   Pt willing to switch back to gabapentin and stop lyrica due to spacey side effect an potential dizzy for that one day.    Review of Systems  Constitutional: Negative for chills, fatigue and fever.  Skin: Negative for pallor.  Neurological: Positive for dizziness. Negative for numbness and headaches.       One day last week.  Hematological: Negative for adenopathy. Does not bruise/bleed easily.  Psychiatric/Behavioral: Negative for behavioral problems, confusion, decreased concentration, self-injury and suicidal ideas. The patient is not nervous/anxious.        Frustrates and mood decreased with work situation.  Maybe side effect of med.    Past Medical History:  Diagnosis Date  . Anemia   . Anxiety   . ASD (atrial septal defect) 06/11/2014   S/p patch repair  . Atherosclerosis of abdominal aorta (HCC) 06/11/2014   Age advanced atherosclerosis of the infrarenal aorta  . Depression   . Dysmenorrhea   . Dysuria   . Encounter for long-term (current) use of other medications   . Headache   . Hematuria   . High cholesterol   . Left knee pain   .  Migraine   . Other malaise and fatigue   . Pure hyperglyceridemia   . Routine general medical examination at a health care facility   . Shoulder pain   . Vitamin D deficiency   . Vitamin D deficiency   . VSD (ventricular septal defect), multiple 06/11/2014   S/p patch repair     Social History   Social History  . Marital status: Married    Spouse name: N/A  . Number of children: 4  . Years of education: College   Occupational History  . RN     [employer]   Social History Main Topics  . Smoking status: Former Games developermoker  . Smokeless tobacco: Never Used  . Alcohol use No  . Drug use: No  . Sexual activity: Not on file   Other Topics Concern  . Not on file   Social History Narrative   Lives at home with husband and children.   Right-handed.    Past Surgical History:  Procedure Laterality Date  . c seaction    . CESAREAN SECTION    . KNEE ARTHROSCOPY W/ ACL RECONSTRUCTION AND HAMSTRING GRAFT Left   . vetricular hole      Family History  Problem Relation Age of Onset  . Depression Mother   . High blood pressure Mother   .  Hyperlipidemia Mother   . Anxiety disorder Mother   . Hypertension Mother   . High blood pressure Father   . Hyperlipidemia Father   . Hypertension Father   . Crohn's disease Father   . Kidney disease Father   . Peripheral vascular disease Father   . Peripheral vascular disease Paternal Grandfather     Allergies  Allergen Reactions  . Banana Other (See Comments)    Headaches  . Nsaids Nausea Only and Other (See Comments)    Also caused stomach ulcers (can only tolerate in limited doses)  . Other Other (See Comments)    SSRI(s): Makes the patient feel "disconnected" Tricyclic antidepressants- Muscle spasms Sulfates- Itching  . Sulfa Antibiotics Hives    Current Outpatient Prescriptions on File Prior to Visit  Medication Sig Dispense Refill  . acetaminophen (TYLENOL) 650 MG CR tablet Take 650 mg by mouth every 8 (eight) hours as needed for  pain. Reported on 07/01/2015    . clindamycin (CLINDAGEL) 1 % gel Apply topically 2 (two) times daily.    . clonazePAM (KLONOPIN) 0.5 MG tablet Take 1 tablet (0.5 mg total) by mouth 2 (two) times daily as needed for anxiety. 60 tablet 0  . ibuprofen (ADVIL,MOTRIN) 200 MG tablet Take 200 mg by mouth every 6 (six) hours as needed for moderate pain.     . pantoprazole (PROTONIX) 40 MG tablet Take 1 tablet (40 mg total) by mouth as needed. (Patient taking differently: Take 40 mg by mouth daily as needed (indigestion). ) 90 tablet 1  . pregabalin (LYRICA) 150 MG capsule 1 tab po tid 90 capsule 0  . promethazine (PHENERGAN) 25 MG tablet Take 1 tablet (25 mg total) by mouth every 8 (eight) hours as needed for nausea or vomiting. 20 tablet 0  . SUMAtriptan (IMITREX) 50 MG tablet Take 1 tablet (50 mg total) by mouth every 2 (two) hours as needed for migraine. May repeat in 2 hours if headache persists or recurs. (Patient taking differently: Take 50 mg by mouth once. May repeat in 2 hours if headache persists or recurs) 15 tablet 6  . traMADol (ULTRAM) 50 MG tablet Take 1 tablet (50 mg total) by mouth every 8 (eight) hours as needed for severe pain. 90 tablet 0  . meloxicam (MOBIC) 7.5 MG tablet Take 1-2 tablets (7.5-15 mg total) by mouth daily. (Patient not taking: Reported on 03/14/2016) 60 tablet 1  . rosuvastatin (CRESTOR) 5 MG tablet Take 1 tablet (5 mg total) by mouth daily. (Patient not taking: Reported on 03/14/2016) 30 tablet 2   No current facility-administered medications on file prior to visit.     BP 114/72 (BP Location: Right Arm, Patient Position: Sitting, Cuff Size: Normal)   Pulse 72   Temp 98.2 F (36.8 C) (Oral)   Resp 16   Ht 5\' 6"  (1.676 m)   Wt 163 lb (73.9 kg)   LMP 03/14/2016 (LMP Unknown)   SpO2 98%   BMI 26.31 kg/m       Objective:   Physical Exam  General Mental Status- Alert. General Appearance- Not in acute distress.   Skin General: Color- Normal Color. Moisture-  Normal Moisture.  Neck Carotid Arteries- Normal color. Moisture- Normal Moisture. No carotid bruits. No JVD.  Chest and Lung Exam Auscultation: Breath Sounds:-Normal.  Cardiovascular Auscultation:Rythm- Regular. Murmurs & Other Heart Sounds:Auscultation of the heart reveals- No Murmurs.  Abdomen Inspection:-Inspeection Normal. Palpation/Percussion:Note:No mass. Palpation and Percussion of the abdomen reveal- Non Tender, Non Distended + BS, no  rebound or guarding.   Neurologic Cranial Nerve exam:- CN III-XII intact(No nystagmus), symmetric smile. Strength:- 5/5 equal and symmetric strength both upper and lower extremities.      Assessment & Plan:  Your pain from fibromyalgia was much better on higher lyrica but report side effects and have concerns that this may lead to further work problems.  After discussion we decided you go back to your former gabapentin regimen. You have some at home. Please let me know what that regimen is.(what bottle sig states). Continue the tramadol  Your side effects seemed to coincide with higher dose lyrica and lower dose lyrica was not effective for your pain.  If you still have perceived recurrence of side effects then would need to consider getting labs and maybe CT of head/imaging due to nature of reported side effects.  Follow up in 3-4 weeks or as needed  Thyra Yinger, Ramon DredgeEdward, [employer]F CorporationPA-C

## 2016-03-14 NOTE — Patient Instructions (Addendum)
Your pain from fibromyalgia was much better/less on higher lyrica but you report side effects and have concerns that this may lead to further work problems.  After discussion we decided you go back to your former gabapentin regimen. You have some at home. Please let me know what that regimen is.(what bottle sig states). Continue the tramadol  Your side effects seemed to coincide with higher dose lyrica and lower dose lyrica was not effective for your pain.  If you still have perceived recurrence of side effects then would need to consider getting labs and maybe CT of head/imaging due to nature of reported side effects.  Follow up in 3-4 weeks or as needed

## 2016-03-14 NOTE — Progress Notes (Signed)
Pre visit review using our clinic review tool, if applicable. No additional management support is needed unless otherwise documented below in the visit note. 

## 2016-03-17 DIAGNOSIS — H5213 Myopia, bilateral: Secondary | ICD-10-CM | POA: Diagnosis not present

## 2016-03-19 ENCOUNTER — Telehealth: Payer: Self-pay | Admitting: Medical

## 2016-03-19 NOTE — Telephone Encounter (Signed)
Caller name: Marcille BuffyDeborah S Freestone Relation to MV:HQIOpt:self Call back number: [phone] Pharmacy:  Reason for call: Pt states was in office last week (03-14-16) and saw PCP, pt informs that PCP recommended for her to stop Lyrica since she was having some dizzyness, pt mentioned does not have dizzyness but since last week she is not feeling well at all, states is having a lot of anxiety and has not been able to sleep, pt states has been having symptoms of hot or cold and sweating a lot , Pt would like to know is this a results of being off lyrica and wants to know what should she do. Pt also mentioned that she stopped taken tramadol alos since she is not able to sleep but this is still not working since pt she is still not sleeping. Pt would like to know what to do (crying). Pt did not go to work today since she did not feel good at all. Please advise ASAP.

## 2016-03-19 NOTE — Telephone Encounter (Signed)
Would you call pt and have her schedule 30 minute appointment. Quite complicated situation regarding med changes and her described situation at work. Just can't make changes on phone. Can she come in 1 pm-1:30 on wed. Or have her be last pt of the day on wed.

## 2016-03-19 NOTE — Telephone Encounter (Signed)
Once patient was scheduled, call was transferred to nurse.  Nurse spoke to patient.  Pt was tearful. Hesitant about coming in for appt.  States she doesn't want to come in.  She just wants to feel normal again (crying).  She doesn't like the way the medications are making her feel.  Feels kind of hopeless. She's tired of being in pain.  States lyrica makes her head feel foggy.  Tramadol is not working.  She took 1/2 pill of Klonopin and that seemed to help some.  She states multiple times that she was just tried and wants to feel normal again.  She feels very emotional and wants to know if it's because of the medication.  States she cried for several hours earlier today.  States, I just don't think I can make it.  Pt denied suicidal or homicidal ideations.  States she's just tired. She's not sleeping well and wants something to help her sleep.  She's in pain and just wants to feel better.  She mentioned possibly wanting to see a therapist.  Pt was advised to come in and see provider.  Given how she's been feeling all day, she was advised to come in a little sooner.  Appt was rescheduled for tomorrow (03/20/16) at 8:30 am for a 30 min appt.

## 2016-03-19 NOTE — Telephone Encounter (Signed)
Please advise 

## 2016-03-19 NOTE — Telephone Encounter (Signed)
Patient scheduled for 03/21/16 at 1:30 with PCP  °

## 2016-03-20 ENCOUNTER — Ambulatory Visit (INDEPENDENT_AMBULATORY_CARE_PROVIDER_SITE_OTHER): Payer: 59 | Admitting: Medical

## 2016-03-20 ENCOUNTER — Encounter: Payer: Self-pay | Admitting: Medical

## 2016-03-20 VITALS — BP 108/61 | HR 70 | Temp 98.5°F | Resp 16 | Ht 66.0 in | Wt 160.1 lb

## 2016-03-20 DIAGNOSIS — F411 Generalized anxiety disorder: Secondary | ICD-10-CM | POA: Diagnosis not present

## 2016-03-20 DIAGNOSIS — R61 Generalized hyperhidrosis: Secondary | ICD-10-CM

## 2016-03-20 DIAGNOSIS — M797 Fibromyalgia: Secondary | ICD-10-CM | POA: Diagnosis not present

## 2016-03-20 DIAGNOSIS — R0981 Nasal congestion: Secondary | ICD-10-CM

## 2016-03-20 DIAGNOSIS — R5383 Other fatigue: Secondary | ICD-10-CM | POA: Diagnosis not present

## 2016-03-20 DIAGNOSIS — F449 Dissociative and conversion disorder, unspecified: Secondary | ICD-10-CM | POA: Diagnosis not present

## 2016-03-20 DIAGNOSIS — R509 Fever, unspecified: Secondary | ICD-10-CM

## 2016-03-20 LAB — CBC WITH DIFFERENTIAL/PLATELET
BASOS PCT: 0.5 % (ref 0.0–3.0)
Basophils Absolute: 0 10*3/uL (ref 0.0–0.1)
EOS ABS: 0.1 10*3/uL (ref 0.0–0.7)
Eosinophils Relative: 1.6 % (ref 0.0–5.0)
HEMATOCRIT: 42.4 % (ref 36.0–46.0)
HEMOGLOBIN: 14.2 g/dL (ref 12.0–15.0)
LYMPHS PCT: 32.8 % (ref 12.0–46.0)
Lymphs Abs: 2.7 10*3/uL (ref 0.7–4.0)
MCHC: 33.6 g/dL (ref 30.0–36.0)
MCV: 87.8 fl (ref 78.0–100.0)
MONOS PCT: 9.1 % (ref 3.0–12.0)
Monocytes Absolute: 0.7 10*3/uL (ref 0.1–1.0)
Neutro Abs: 4.6 10*3/uL (ref 1.4–7.7)
Neutrophils Relative %: 56 % (ref 43.0–77.0)
Platelets: 232 10*3/uL (ref 150.0–400.0)
RBC: 4.84 Mil/uL (ref 3.87–5.11)
RDW: 12.8 % (ref 11.5–15.5)
WBC: 8.1 10*3/uL (ref 4.0–10.5)

## 2016-03-20 LAB — COMPREHENSIVE METABOLIC PANEL
ALBUMIN: 4.4 g/dL (ref 3.5–5.2)
ALT: 18 U/L (ref 0–35)
AST: 17 U/L (ref 0–37)
Alkaline Phosphatase: 75 U/L (ref 39–117)
BILIRUBIN TOTAL: 0.3 mg/dL (ref 0.2–1.2)
BUN: 15 mg/dL (ref 6–23)
CALCIUM: 9.5 mg/dL (ref 8.4–10.5)
CHLORIDE: 103 meq/L (ref 96–112)
CO2: 29 mEq/L (ref 19–32)
CREATININE: 0.63 mg/dL (ref 0.40–1.20)
GFR: 107 mL/min (ref >60.00)
Glucose, Bld: 98 mg/dL (ref 70–99)
Potassium: 4.6 mEq/L (ref 3.5–5.1)
Sodium: 140 mEq/L (ref 135–145)
Total Protein: 7.5 g/dL (ref 6.0–8.3)

## 2016-03-20 LAB — POC INFLUENZA A&B (BINAX/QUICKVUE)
INFLUENZA A, POC: NEGATIVE
Influenza B, POC: NEGATIVE

## 2016-03-20 LAB — TSH: TSH: 1.54 u[IU]/mL (ref 0.35–4.50)

## 2016-03-20 MED ORDER — FLUTICASONE PROPIONATE 50 MCG/ACT NA SUSP: 2.0000 | Freq: Every day | NASAL | 1 refills | Status: DC

## 2016-03-20 MED ORDER — AZITHROMYCIN 250 MG PO TABS: ORAL_TABLET | ORAL | 0 refills | Status: DC

## 2016-03-20 MED FILL — AZITHROMYCIN 250 MG TABLET: 250 | 5 days supply | Qty: 6 | Fill #0

## 2016-03-20 MED FILL — FLUTICASONE PROP 50 MCG SPR: 50 | 30 days supply | Qty: 16 | Fill #0

## 2016-03-20 NOTE — Patient Instructions (Addendum)
I want you to continue the lyrica and tramadol for fibromyalgia.  I do think seeing psychologist and or psychiatrist would be helpful with history of conversion disorder. Please consider arranging that. Also investigate fibromyalgia support group.  If you have other meeting with your supervisor I want you to ask them specifics regarding complaints. With your job as we need to be careful.   Use your fmla when needed. Not to work this Thursday. If you work Friday and have a tough day then let me know could write you off for one week.  Use clonazepam at night if needed but not night before work. As you feel spacey the next day.  If you have severe anxiety, depression or thoughts huring self or others then ED evaluation.  For nasal congestion and uri symptoms flonase. If sinus pressure worsens start azithrormycin.  We will contact you on the flu test done for the recent intermittent chills, sweat and myalgia(but this muscle soreness may be fibro flare) Flu test came back negative and pt was actually notified while in the office.  Follow up in 10 days or as needed  Total time spent with patient at least 45 minutes. 50% of time spent counseling pt medication regimen, using her fmla when needed, how we should approach/address any of her supervisor vague described concern regarding potential side effects of her lyrica which appears to control her pain but may decrease quality of there work.

## 2016-03-20 NOTE — Progress Notes (Signed)
Pre visit review using our clinic review tool, if applicable. No additional management support is needed unless otherwise documented below in the visit note/SLS  

## 2016-03-20 NOTE — Progress Notes (Signed)
Subjective:    Patient ID: Joanna Anderson, female    DOB: 1965-09-08, 50 y.o.   MRN: [id number]  HPI  Pt in states she had a lot of pain when she stopped her lyrica and went back to neurontin . We discussed doing this after her coworkers and supervisor thought her behavior was off(and she had just started lyrica at that time). Pt percieved that lyrica may have causing some possible cognitive processing delay but she was not sure. I did not want her to have side effect that could effect her work. She expressed concern of loosing her job or being wrote up. 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  Pt wanted to try restart neurontin and stop lyrica. But neurontin has not helped at all. She stated severe pain all over as she stopped the lyrica. Then she got anxious and depressed feeling the severe pain.   Pt thinks if she takes clonazepam will effect the way she feels. She states feeled spaced out with clonzepam. She now attributes her being off possibly from the clonazapam. She states if uses med at night before she works will feel slow in her thinking.  Pt has been emotional and frustrated dealing with the level of pain. She feels better today.  Pt restarted lyrica last night. It seems to be helping. She got back on tramadol as well.  She states feels good presently.   Pt has history of conversion disorder. In February Dr. Alwyn RenHopper had plans for her to see psychologist.    Review of Systems  Constitutional: Positive for chills and diaphoresis. Negative for fatigue.       Subjective chills and sweats this weekend. Clearing up now.  HENT: Positive for congestion. Negative for ear pain, nosebleeds and rhinorrhea.   Respiratory: Negative for chest tightness, shortness of breath and wheezing.   Cardiovascular: Negative for chest pain and palpitations.  Gastrointestinal:  Negative for abdominal pain, blood in stool, nausea and vomiting.  Musculoskeletal: Positive for myalgias. Negative for back pain.       Fibromyalgia type pain.  Skin: Negative for rash.  Neurological: Negative for dizziness, speech difficulty, weakness, numbness and headaches.  Hematological: Negative for adenopathy. Does not bruise/bleed easily.  Psychiatric/Behavioral: Positive for dysphoric mood. Negative for behavioral problems, confusion and suicidal ideas. The patient is nervous/anxious.        Mood decreased recently.    Past Medical History:  Diagnosis Date  . Anemia   . Anxiety   . ASD (atrial septal defect) 06/11/2014   S/p patch repair  . Atherosclerosis of abdominal aorta (HCC) 06/11/2014   Age advanced atherosclerosis of the infrarenal aorta  . Depression   . Dysmenorrhea   . Dysuria   . Encounter for long-term (current) use of other medications   . Headache   . Hematuria   . High cholesterol   . Left knee pain   . Migraine   . Other malaise and fatigue   . Pure hyperglyceridemia   . Routine general medical examination at a health care facility   . Shoulder pain   . Vitamin D deficiency   . Vitamin D deficiency   . VSD (ventricular septal defect), multiple 06/11/2014   S/p patch repair     Social History   Social History  . Marital status: Married    Spouse name: N/A  .  Number of children: 4  . Years of education: College   Occupational History  . RN     [employer]   Social History Main Topics  . Smoking status: Never Smoker  . Smokeless tobacco: Never Used  . Alcohol use No  . Drug use: No  . Sexual activity: Not on file   Other Topics Concern  . Not on file   Social History Narrative   Lives at home with husband and children.   Right-handed.    Past Surgical History:  Procedure Laterality Date  . c seaction    . CESAREAN SECTION    . KNEE ARTHROSCOPY W/ ACL RECONSTRUCTION AND HAMSTRING GRAFT Left   . vetricular hole      Family History    Problem Relation Age of Onset  . Depression Mother   . High blood pressure Mother   . Hyperlipidemia Mother   . Anxiety disorder Mother   . Hypertension Mother   . High blood pressure Father   . Hyperlipidemia Father   . Hypertension Father   . Crohn's disease Father   . Kidney disease Father   . Peripheral vascular disease Father   . Peripheral vascular disease Paternal Grandfather     Allergies  Allergen Reactions  . Banana Other (See Comments)    Headaches  . Nsaids Nausea Only and Other (See Comments)    Also caused stomach ulcers (can only tolerate in limited doses)  . Other Other (See Comments)    SSRI(s): Makes the patient feel "disconnected" Tricyclic antidepressants- Muscle spasms Sulfates- Itching  . Sulfa Antibiotics Hives    Current Outpatient Prescriptions on File Prior to Visit  Medication Sig Dispense Refill  . acetaminophen (TYLENOL) 650 MG CR tablet Take 650 mg by mouth every 8 (eight) hours as needed for pain. Reported on 07/01/2015    . baclofen (LIORESAL) 10 MG tablet Take 10 mg by mouth 4 (four) times daily as needed for muscle spasms.    . clindamycin (CLINDAGEL) 1 % gel Apply topically 2 (two) times daily as needed.     . clonazePAM (KLONOPIN) 0.5 MG tablet Take 1 tablet (0.5 mg total) by mouth 2 (two) times daily as needed for anxiety. 60 tablet 0  . ibuprofen (ADVIL,MOTRIN) 200 MG tablet Take 200 mg by mouth every 6 (six) hours as needed for moderate pain.     . meloxicam (MOBIC) 7.5 MG tablet Take 1-2 tablets (7.5-15 mg total) by mouth daily. 60 tablet 1  . pantoprazole (PROTONIX) 40 MG tablet Take 1 tablet (40 mg total) by mouth as needed. (Patient taking differently: Take 40 mg by mouth daily as needed (indigestion). ) 90 tablet 1  . promethazine (PHENERGAN) 25 MG tablet Take 1 tablet (25 mg total) by mouth every 8 (eight) hours as needed for nausea or vomiting. 20 tablet 0  . rosuvastatin (CRESTOR) 5 MG tablet Take 1 tablet (5 mg total) by mouth  daily. 30 tablet 2  . SUMAtriptan (IMITREX) 50 MG tablet Take 1 tablet (50 mg total) by mouth every 2 (two) hours as needed for migraine. May repeat in 2 hours if headache persists or recurs. (Patient taking differently: Take 50 mg by mouth once. May repeat in 2 hours if headache persists or recurs) 15 tablet 6  . traMADol (ULTRAM) 50 MG tablet Take 1 tablet (50 mg total) by mouth every 8 (eight) hours as needed for severe pain. 90 tablet 0   No current facility-administered medications on file prior to visit.  BP 108/61 (BP Location: Right Arm, Patient Position: Sitting, Cuff Size: Large)   Pulse 70   Temp 98.5 F (36.9 C) (Oral)   Resp 16   Ht 5\' 6"  (1.676 m)   Wt 160 lb 2 oz (72.6 kg)   LMP 02/15/2016   SpO2 98%   BMI 25.84 kg/m      Objective:   Physical Exam  General Mental Status- Alert. General Appearance- Not in acute distress. But expresses mild frustation today over her fibromyalgia.   HEENT Head- Normal. Ear Auditory Canal - Left- Normal. Right - Normal.Tympanic Membrane- Left- Normal. Right- Normal. Eye Sclera/Conjunctiva- Left- Normal. Right- Normal. Nose & Sinuses Nasal Mucosa- Left-  Boggy and Congested. Right-  Boggy and  Congested.Bilateral no maxillary and no frontal sinus pressure. Mouth & Throat Lips: Upper Lip- Normal: no dryness, cracking, pallor, cyanosis, or vesicular eruption. Lower Lip-Normal: no dryness, cracking, pallor, cyanosis or vesicular eruption. Buccal Mucosa- Bilateral- No Aphthous ulcers. Oropharynx- No Discharge or Erythema. Tonsils: Characteristics- Bilateral- No Erythema or Congestion. Size/Enlargement- Bilateral- No enlargement. Discharge- bilateral-None.   Chest and Lung Exam Auscultation: Breath Sounds:-Clear even and unlabored.  Cardiovascular Auscultation:Rythm- Regular, rate and rhythm. Murmurs & Other Heart Sounds:Ausculatation of the heart reveal- No Murmurs.  Lymphatic Head & Neck General Head & Neck  Lymphatics: Bilateral: Description- No Localized lymphadenopathy.   Abdomen Inspection:-Inspeection Normal. Palpation/Percussion:Note:No mass. Palpation and Percussion of the abdomen reveal- Non Tender, Non Distended + BS, no rebound or guarding.  Neurologic Cranial Nerve exam:- CN III-XII intact(No nystagmus), symmetric smile. Strength:- 5/5 equal and symmetric strength both upper and lower extremities.      Assessment & Plan:  I want you to continue the lyrica and tramadol for fibromyalgia.  I do think seeing psychologist and or psychiatrist would be helpful with history of conversion disorder. Please consider arranging that. Also investigate fibromyalgia support group.  If you have other meeting with your supervisor I want you to ask them specifics regarding complaints. With your job as we need to be careful.   Use your fmla when needed. Not to work this Thursday. If you work Friday and have a tough day then let me know could write you off for one week.  Use clonazepam at night if needed but not night before work. As you feel spacey the next day.  If you have severe anxiety, depression or thoughts huring self or others then ED evaluation.  For nasal congestion and uri symptoms flonase. If sinus pressure worsens start azithrormycin.  We will contact you on the flu test done for the recent intermitten chills, sweat and myalgia(but this muscle soreness may be fibro flare) Flu test came back negative and pt was actually notified while in the office..  Follow up in 10 days or as needed  Cady Hafen, Ramon Dredge, [employer]

## 2016-03-21 ENCOUNTER — Ambulatory Visit: Payer: 59 | Admitting: Medical

## 2016-03-28 ENCOUNTER — Encounter: Payer: Self-pay | Admitting: Medical

## 2016-03-28 ENCOUNTER — Ambulatory Visit (INDEPENDENT_AMBULATORY_CARE_PROVIDER_SITE_OTHER): Payer: 59 | Admitting: Medical

## 2016-03-28 VITALS — BP 108/66 | HR 71 | Temp 98.2°F | Ht 66.0 in | Wt 166.8 lb

## 2016-03-28 DIAGNOSIS — M797 Fibromyalgia: Secondary | ICD-10-CM | POA: Diagnosis not present

## 2016-03-28 DIAGNOSIS — F411 Generalized anxiety disorder: Secondary | ICD-10-CM

## 2016-03-28 MED ORDER — TRAMADOL HCL 50 MG PO TABS: 50.0000 mg | ORAL_TABLET | Freq: Three times a day (TID) | ORAL | 0 refills | Status: DC | PRN

## 2016-03-28 MED ORDER — PREGABALIN 150 MG PO CAPS: 150.0000 mg | ORAL_CAPSULE | Freq: Three times a day (TID) | ORAL | 2 refills | Status: DC

## 2016-03-28 MED FILL — LYRICA 150 MG CAPSULE: 150 | 30 days supply | Qty: 90 | Fill #0 | Status: TO

## 2016-03-28 NOTE — Patient Instructions (Addendum)
You are doing well with your fibromyalgia will continue the lyrica and tramadol today.  For your anxiety you can use clonazepam very sparingly as we discussed. If you can get by without that would be great.  Sounds like you don't need counseling presently but would recommend reconsider if mood worsens again.   Follow up in 1-2 months or as needed

## 2016-03-28 NOTE — Progress Notes (Signed)
Pre visit review using our clinic tool,if applicable. No additional management support is needed unless otherwise documented below in the visit note.  

## 2016-03-28 NOTE — Progress Notes (Signed)
Subjective:    Patient ID: Joanna Anderson, female    DOB: 02/13/1966, 50 y.o.   MRN: [id number]  HPI  Pt in for follow up.   Pt states nNow back on lyrica and her pain and her symptoms are much better(no pain presently today on exam). Pain is controlled regarding her fibromyalgia. Pt feels like she can work presently. She has not had issues at work presently. Pt thinks maybe she could have a better attitude at work. No problems processing information at work or any cognitive problems on lyrica.  Pt is also on tramadol as well.   Pt is no longer using her clonazepam. She thinks this may have been the cause of her work issues and being off as Restaurant manager, fast foodcoworkers and supervisor described. I advised her to not use the night before she worked or day she works.   Pt does not want to do counseling presently although I had advised. She also describes in past side effects with SSRI. Pt does have history of conversion disorder. Pt has a lot of family issues that she has been dealing with. She feels like she has handled these issues already. She did also attend counseling years ago.    Review of Systems  Constitutional: Negative for chills, fatigue and fever.  Respiratory: Negative for cough, chest tightness, shortness of breath and wheezing.   Cardiovascular: Negative for chest pain and palpitations.  Gastrointestinal: Negative for abdominal pain, diarrhea, nausea and vomiting.  Musculoskeletal: Negative for back pain, myalgias and neck pain.  Skin: Negative for rash.  Neurological: Negative for dizziness, syncope, weakness, numbness and headaches.  Hematological: Negative for adenopathy. Does not bruise/bleed easily.  Psychiatric/Behavioral: Negative for behavioral problems, confusion, dysphoric mood, sleep disturbance and suicidal ideas. The patient is nervous/anxious.        But controlled recently.    Past Medical History:  Diagnosis Date  . Anemia   . Anxiety   . ASD (atrial septal defect)  06/11/2014   S/p patch repair  . Atherosclerosis of abdominal aorta (HCC) 06/11/2014   Age advanced atherosclerosis of the infrarenal aorta  . Depression   . Dysmenorrhea   . Dysuria   . Encounter for long-term (current) use of other medications   . Headache   . Hematuria   . High cholesterol   . Left knee pain   . Migraine   . Other malaise and fatigue   . Pure hyperglyceridemia   . Routine general medical examination at a health care facility   . Shoulder pain   . Vitamin D deficiency   . Vitamin D deficiency   . VSD (ventricular septal defect), multiple 06/11/2014   S/p patch repair     Social History   Social History  . Marital status: Married    Spouse name: N/A  . Number of children: 4  . Years of education: College   Occupational History  . RN     [employer]   Social History Main Topics  . Smoking status: Never Smoker  . Smokeless tobacco: Never Used  . Alcohol use No  . Drug use: No  . Sexual activity: Not on file   Other Topics Concern  . Not on file   Social History Narrative   Lives at home with husband and children.   Right-handed.    Past Surgical History:  Procedure Laterality Date  . c seaction    . CESAREAN SECTION    . KNEE ARTHROSCOPY W/ ACL RECONSTRUCTION AND HAMSTRING GRAFT  Left   . vetricular hole      Family History  Problem Relation Age of Onset  . Depression Mother   . High blood pressure Mother   . Hyperlipidemia Mother   . Anxiety disorder Mother   . Hypertension Mother   . High blood pressure Father   . Hyperlipidemia Father   . Hypertension Father   . Crohn's disease Father   . Kidney disease Father   . Peripheral vascular disease Father   . Peripheral vascular disease Paternal Grandfather     Allergies  Allergen Reactions  . Banana Other (See Comments)    Headaches  . Nsaids Nausea Only and Other (See Comments)    Also caused stomach ulcers (can only tolerate in limited doses)  . Other Other (See Comments)     SSRI(s): Makes the patient feel "disconnected" Tricyclic antidepressants- Muscle spasms Sulfates- Itching  . Sulfa Antibiotics Hives    Current Outpatient Prescriptions on File Prior to Visit  Medication Sig Dispense Refill  . acetaminophen (TYLENOL) 650 MG CR tablet Take 650 mg by mouth every 8 (eight) hours as needed for pain. Reported on 07/01/2015    . baclofen (LIORESAL) 10 MG tablet Take 10 mg by mouth 4 (four) times daily as needed for muscle spasms.    . clindamycin (CLINDAGEL) 1 % gel Apply topically 2 (two) times daily as needed.     . clonazePAM (KLONOPIN) 0.5 MG tablet Take 1 tablet (0.5 mg total) by mouth 2 (two) times daily as needed for anxiety. 60 tablet 0  . fluticasone (FLONASE) 50 MCG/ACT nasal spray Place 2 sprays into both nostrils daily. 16 g 1  . ibuprofen (ADVIL,MOTRIN) 200 MG tablet Take 200 mg by mouth every 6 (six) hours as needed for moderate pain.     . meloxicam (MOBIC) 7.5 MG tablet Take 1-2 tablets (7.5-15 mg total) by mouth daily. 60 tablet 1  . pantoprazole (PROTONIX) 40 MG tablet Take 1 tablet (40 mg total) by mouth as needed. (Patient taking differently: Take 40 mg by mouth daily as needed (indigestion). ) 90 tablet 1  . promethazine (PHENERGAN) 25 MG tablet Take 1 tablet (25 mg total) by mouth every 8 (eight) hours as needed for nausea or vomiting. 20 tablet 0  . rosuvastatin (CRESTOR) 5 MG tablet Take 1 tablet (5 mg total) by mouth daily. 30 tablet 2  . SUMAtriptan (IMITREX) 50 MG tablet Take 1 tablet (50 mg total) by mouth every 2 (two) hours as needed for migraine. May repeat in 2 hours if headache persists or recurs. (Patient taking differently: Take 50 mg by mouth once. May repeat in 2 hours if headache persists or recurs) 15 tablet 6  . traMADol (ULTRAM) 50 MG tablet Take 1 tablet (50 mg total) by mouth every 8 (eight) hours as needed for severe pain. 90 tablet 0   No current facility-administered medications on file prior to visit.     BP 108/66    Pulse 71   Temp 98.2 F (36.8 C) (Oral)   Ht 5\' 6"  (1.676 m)   Wt 166 lb 12.8 oz (75.7 kg)   LMP 03/21/2016   SpO2 96%   BMI 26.92 kg/m       Objective:   Physical Exam  General Mental Status- Alert. General Appearance- Not in acute distress.   Skin General: Color- Normal Color. Moisture- Normal Moisture.  Neck Carotid Arteries- Normal color. Moisture- Normal Moisture. No carotid bruits. No JVD.  Chest and Lung Exam Auscultation:  Breath Sounds:-Normal.  Cardiovascular Auscultation:Rythm- Regular. Murmurs & Other Heart Sounds:Auscultation of the heart reveals- No Murmurs.  Abdomen Inspection:-Inspeection Normal. Palpation/Percussion:Note:No mass. Palpation and Percussion of the abdomen reveal- Non Tender, Non Distended + BS, no rebound or guarding.    Neurologic Cranial Nerve exam:- CN III-XII intact(No nystagmus), symmetric smile. Strength:- 5/5 equal and symmetric strength both upper and lower extremities.  Back- on palpation of paraspinal muscles and upper arms today demonstrates no pain.      Assessment & Plan:  You are doing well with your fibromyalgia will continue the lyrica and tramadol today.  For your anxiety you can use clonazepam very sparingly as we discussed. If you can get by without that would be great.  Sounds like you don't need counseling presently but would recommend reconsider if mood worsens again.   Follow up in 1-2 months or as needed

## 2016-04-13 MED FILL — traMADol HCL 50 MG TABS: 50 | 30 days supply | Qty: 90 | Fill #0

## 2016-05-21 ENCOUNTER — Telehealth: Payer: Self-pay | Admitting: Medical

## 2016-05-21 MED ORDER — TRAMADOL HCL 50 MG PO TABS: 50.0000 mg | ORAL_TABLET | Freq: Three times a day (TID) | ORAL | 0 refills | Status: DC | PRN

## 2016-05-21 NOTE — Telephone Encounter (Signed)
Self.   Refill for traMADol    Pharmacy: [facility]

## 2016-05-21 NOTE — Telephone Encounter (Signed)
rx refil of tramadol printed. She can pick up or you can fax. Placing on your desk.

## 2016-05-21 NOTE — Telephone Encounter (Signed)
Refill request for Tramadol 50mg  Last filled by MD on - 04/03/16 fill date, #90x0 Last AEX - 05/30/15 Next AEX - 1-2 Months Please Advise on refills/SLS 02/19

## 2016-05-22 MED FILL — traMADol HCL 50 MG TABS: 50 | 30 days supply | Qty: 90 | Fill #0

## 2016-05-22 NOTE — Telephone Encounter (Signed)
Rx request faxed to pharmacy/SLS  

## 2016-06-04 ENCOUNTER — Telehealth: Payer: Self-pay | Admitting: Medical

## 2016-06-04 DIAGNOSIS — F411 Generalized anxiety disorder: Secondary | ICD-10-CM

## 2016-06-04 MED ORDER — CLONAZEPAM 0.5 MG PO TABS: 0.5000 mg | ORAL_TABLET | Freq: Two times a day (BID) | ORAL | 0 refills | Status: DC | PRN

## 2016-06-04 MED ORDER — FAMCICLOVIR 500 MG PO TABS: 500.0000 mg | ORAL_TABLET | Freq: Three times a day (TID) | ORAL | 0 refills | Status: DC

## 2016-06-04 NOTE — Telephone Encounter (Signed)
Patient calling in regards to fever blister. Stated you said next time one comes all she had to do was call and you would call something in, Patient also needs a refill on clonazePAM (KLONOPIN) 0.5 MG tablet she [facility].   [phone]

## 2016-06-04 NOTE — Telephone Encounter (Signed)
Klonopin faxed.  Famvir sent electronically.    Fibromyalgia flares seem to trigger fever blister.  Pt wants to know if there is a connection?  Please advise.

## 2016-06-04 NOTE — Telephone Encounter (Signed)
I printed to klonopin rx and send famvir to her pharmacy. If she start famvir asap within 48 hours of control onset should help.

## 2016-06-04 NOTE — Telephone Encounter (Signed)
Anything that causes stress can flare fever blister outbreak. Notify pt to this effect please.

## 2016-06-04 NOTE — Telephone Encounter (Signed)
Please advise regarding fever blister.  Clonazepam 0.5 mg tablets Take 1 tablet (0.5 mg total) by mouth 2 (two) times daily as needed for anxiety Last filled: 01/19/16 Amt: 60,0 Last OV: 03/28/16 10/4/17controlled substance contract signed, uds sample given, low risk next screen 07/04/16 Please advise

## 2016-06-05 MED FILL — clonazePAM 0.5 MG TABS: 0.5 | 30 days supply | Qty: 60 | Fill #0

## 2016-06-05 MED FILL — FAMCICLOVIR 500 MG TABLET: 500 | 7 days supply | Qty: 21 | Fill #0

## 2016-06-05 NOTE — Telephone Encounter (Signed)
Patient informed via My Chart message/SLS 03/06 

## 2016-06-07 MED FILL — LYRICA 150 MG CAPSULE: 150 | 30 days supply | Qty: 90 | Fill #0

## 2016-07-03 ENCOUNTER — Encounter: Payer: Self-pay | Admitting: Adult Health

## 2016-07-03 ENCOUNTER — Ambulatory Visit (INDEPENDENT_AMBULATORY_CARE_PROVIDER_SITE_OTHER): Payer: 59 | Admitting: Adult Health

## 2016-07-03 ENCOUNTER — Telehealth: Payer: Self-pay | Admitting: Medical

## 2016-07-03 VITALS — Temp 98.4°F | Ht 66.0 in | Wt 162.6 lb

## 2016-07-03 DIAGNOSIS — R195 Other fecal abnormalities: Secondary | ICD-10-CM

## 2016-07-03 DIAGNOSIS — R102 Pelvic and perineal pain: Secondary | ICD-10-CM | POA: Diagnosis not present

## 2016-07-03 LAB — POCT URINALYSIS DIPSTICK
Bilirubin, UA: NEGATIVE
Glucose, UA: NEGATIVE
KETONES UA: NEGATIVE
LEUKOCYTES UA: NEGATIVE
NITRITE UA: NEGATIVE
PH UA: 6 (ref 5.0–8.0)
PROTEIN UA: NEGATIVE
Spec Grav, UA: 1.01 (ref 1.030–1.035)
Urobilinogen, UA: NEGATIVE (ref ?–2.0)

## 2016-07-03 NOTE — Progress Notes (Signed)
Subjective:    Patient ID: Joanna Anderson, female    DOB: 1965-05-02, 51 y.o.   MRN: [id number]  HPI  51 year old female who  has a past medical history of Anemia; Anxiety; ASD (atrial septal defect) (06/11/2014); Atherosclerosis of abdominal aorta (HCC) (06/11/2014); Depression; Dysmenorrhea; Dysuria; Encounter for long-term (current) use of other medications; Headache; Hematuria; High cholesterol; Left knee pain; Migraine; Other malaise and fatigue; Pure hyperglyceridemia; Routine general medical examination at a health care facility; Shoulder pain; Vitamin D deficiency; Vitamin D deficiency; and VSD (ventricular septal defect), multiple (06/11/2014). She is a patient of [facility], [place]. I am seeing her for the first time today for an acute issue of abdominal pain, dysuria, and "mucus in stool."   She reports that " a few weeks ago" she started to have pain with urination and she noticed a odor in her urine. This has been waxing and waning over the course of the last few weeks. She also endorses frequency and urgency but has not had any incontinence. She denies any fever but does have chills. She denies any hematuria. She also denies any vaginal discharge but does have a " burning in my crotch".   She does also endorse generalized abdominal pain that is worse in the middle.   During this time frame she noticed that she was having mucus in her stool. This has happened once prior multiple years ago and reports " I had a e coli infection." She denies any blood in stool. Has been taking a probiotic on an irregular basis. She reports alternating episodes of diarrhea and constipation.   Review of Systems  Constitutional: Positive for activity change and chills. Negative for appetite change and fever.  Respiratory: Negative.   Cardiovascular: Negative.   Gastrointestinal: Positive for abdominal pain, constipation and diarrhea. Negative for abdominal distention, anal bleeding, blood in stool, nausea,  rectal pain and vomiting.  Genitourinary: Positive for dysuria, flank pain, frequency, pelvic pain, urgency and vaginal pain. Negative for hematuria, vaginal bleeding and vaginal discharge.  Neurological: Negative.   All other systems reviewed and are negative.  Past Medical History:  Diagnosis Date  . Anemia   . Anxiety   . ASD (atrial septal defect) 06/11/2014   S/p patch repair  . Atherosclerosis of abdominal aorta (HCC) 06/11/2014   Age advanced atherosclerosis of the infrarenal aorta  . Depression   . Dysmenorrhea   . Dysuria   . Encounter for long-term (current) use of other medications   . Headache   . Hematuria   . High cholesterol   . Left knee pain   . Migraine   . Other malaise and fatigue   . Pure hyperglyceridemia   . Routine general medical examination at a health care facility   . Shoulder pain   . Vitamin D deficiency   . Vitamin D deficiency   . VSD (ventricular septal defect), multiple 06/11/2014   S/p patch repair    Social History   Social History  . Marital status: Married    Spouse name: N/A  . Number of children: 4  . Years of education: College   Occupational History  . RN     [employer]   Social History Main Topics  . Smoking status: Never Smoker  . Smokeless tobacco: Never Used  . Alcohol use No  . Drug use: No  . Sexual activity: Not on file   Other Topics Concern  . Not on file   Social History Narrative  Lives at home with husband and children.   Right-handed.    Past Surgical History:  Procedure Laterality Date  . c seaction    . CESAREAN SECTION    . KNEE ARTHROSCOPY W/ ACL RECONSTRUCTION AND HAMSTRING GRAFT Left   . vetricular hole      Family History  Problem Relation Age of Onset  . Depression Mother   . High blood pressure Mother   . Hyperlipidemia Mother   . Anxiety disorder Mother   . Hypertension Mother   . High blood pressure Father   . Hyperlipidemia Father   . Hypertension Father   . Crohn's disease Father     . Kidney disease Father   . Peripheral vascular disease Father   . Peripheral vascular disease Paternal Grandfather     Allergies  Allergen Reactions  . Banana Other (See Comments)    Headaches  . Nsaids Nausea Only and Other (See Comments)    Also caused stomach ulcers (can only tolerate in limited doses)  . Other Other (See Comments)    SSRI(s): Makes the patient feel "disconnected" Tricyclic antidepressants- Muscle spasms Sulfates- Itching  . Sulfa Antibiotics Hives    Current Outpatient Prescriptions on File Prior to Visit  Medication Sig Dispense Refill  . acetaminophen (TYLENOL) 650 MG CR tablet Take 650 mg by mouth every 8 (eight) hours as needed for pain. Reported on 07/01/2015    . baclofen (LIORESAL) 10 MG tablet Take 10 mg by mouth 4 (four) times daily as needed for muscle spasms.    . clonazePAM (KLONOPIN) 0.5 MG tablet Take 1 tablet (0.5 mg total) by mouth 2 (two) times daily as needed for anxiety. 60 tablet 0  . famciclovir (FAMVIR) 500 MG tablet Take 1 tablet (500 mg total) by mouth 3 (three) times daily. 21 tablet 0  . fluticasone (FLONASE) 50 MCG/ACT nasal spray Place 2 sprays into both nostrils daily. 16 g 1  . ibuprofen (ADVIL,MOTRIN) 200 MG tablet Take 200 mg by mouth every 6 (six) hours as needed for moderate pain.     . meloxicam (MOBIC) 7.5 MG tablet Take 1-2 tablets (7.5-15 mg total) by mouth daily. 60 tablet 1  . pantoprazole (PROTONIX) 40 MG tablet Take 1 tablet (40 mg total) by mouth as needed. (Patient taking differently: Take 40 mg by mouth daily as needed (indigestion). ) 90 tablet 1  . pregabalin (LYRICA) 150 MG capsule Take 1 capsule (150 mg total) by mouth 3 (three) times daily. 90 capsule 2  . promethazine (PHENERGAN) 25 MG tablet Take 1 tablet (25 mg total) by mouth every 8 (eight) hours as needed for nausea or vomiting. 20 tablet 0  . rosuvastatin (CRESTOR) 5 MG tablet Take 1 tablet (5 mg total) by mouth daily. 30 tablet 2  . SUMAtriptan (IMITREX)  50 MG tablet Take 1 tablet (50 mg total) by mouth every 2 (two) hours as needed for migraine. May repeat in 2 hours if headache persists or recurs. (Patient taking differently: Take 50 mg by mouth once. May repeat in 2 hours if headache persists or recurs) 15 tablet 6  . traMADol (ULTRAM) 50 MG tablet Take 1 tablet (50 mg total) by mouth every 8 (eight) hours as needed for severe pain. 90 tablet 0   No current facility-administered medications on file prior to visit.     Temp 98.4 F (36.9 C) (Oral)   Ht  (1.676 m)   Wt 162 lb 9.6 oz (73.8 kg)   BMI 26.24  kg/m       Objective:   Physical Exam  Constitutional: She is oriented to person, place, and time. She appears well-developed and well-nourished.  Cardiovascular: Normal rate, normal heart sounds and intact distal pulses.   Pulmonary/Chest: Effort normal and breath sounds normal. No respiratory distress. She has no wheezes. She has no rales. She exhibits no tenderness.  Abdominal: Soft. Bowel sounds are normal. She exhibits no distension and no mass. There is no hepatosplenomegaly or splenomegaly. There is tenderness in the epigastric area and suprapubic area. There is CVA tenderness. There is no rebound and no guarding. No hernia. Hernia confirmed negative in the ventral area, confirmed negative in the right inguinal area and confirmed negative in the left inguinal area.  Genitourinary:  Genitourinary Comments: refused  Neurological: She is alert and oriented to person, place, and time.  Skin: Skin is warm and dry. No rash noted. No erythema. No pallor.  Psychiatric: She has a normal mood and affect. Her behavior is normal. Judgment and thought content normal.  Nursing note and vitals reviewed.     Assessment & Plan:  1. Pelvic pain - POC UA negative except for 1+ blood. Will get KUB to look for kidney stone. She did have CVA tenderness, will send urine culture.  - POCT urinalysis dipstick - + 1 blood.  - Urine culture -  Urinalysis, Routine w reflex microscopic - DG Abd 1 View; Future - Drink plenty of fluid and try Azo until labs come back   2. Mucus in stool - Stool culture - Follow up with PCP for further evaluation and possible referral to GI  Shirline Frees, NP

## 2016-07-03 NOTE — Telephone Encounter (Signed)
Pt was previously told that there was an opening at 3:45 at [facility] today but schedule was full and jumped to 07/04/16 (tomorrow). Scheduler left detailed message on pt's voicemail regarding scheduling error and I have sent email to pt re: potential openings at [facility] [employer] and [facility] [employer].

## 2016-07-03 NOTE — Telephone Encounter (Signed)
Spoke with pt. Denies visible hematuria or rectal bleeding, no diarrhea. Confirms below symptoms and reports that she has also had history of diverticulitis. Advised pt that she needs evaluation in office today but we have no availability. Transferred pt to scheduler to schedule appt.

## 2016-07-03 NOTE — Telephone Encounter (Signed)
caller states she is having abdominal pain an chills . Pain is above her hip near the kidneys as well.Her whole pelvic an perineum are is hurting . It burns when she urinates She just feels sick. Unsure if she has a fever    Unable to reach caller after 3 attempts. Phone number verified through [employer].   Joycelyn Schmid, RN

## 2016-07-03 NOTE — Patient Instructions (Signed)
It was great meeting you today  I will follow up with you regarding your lab work.   Go to the [facility] office today for the x ray   Please drink plenty of fluid and try taking Azo.   Follow up with your PCP if needed

## 2016-07-04 ENCOUNTER — Ambulatory Visit: Payer: 59 | Admitting: Nurse Practitioner

## 2016-07-04 LAB — URINALYSIS, ROUTINE W REFLEX MICROSCOPIC
Bilirubin Urine: NEGATIVE
Ketones, ur: NEGATIVE
Leukocytes, UA: NEGATIVE
Nitrite: NEGATIVE
Total Protein, Urine: NEGATIVE
URINE GLUCOSE: NEGATIVE
Urobilinogen, UA: 0.2 (ref 0.0–1.0)
pH: 6 (ref 5.0–8.0)

## 2016-07-05 ENCOUNTER — Telehealth: Payer: Self-pay | Admitting: Medical

## 2016-07-05 ENCOUNTER — Other Ambulatory Visit (HOSPITAL_COMMUNITY)
Admission: RE | Admit: 2016-07-05 | Discharge: 2016-07-05 | Disposition: A | Discharge status: Home / Self Care | Payer: 59 | Source: Ambulatory Visit | Attending: Medical | Admitting: Medical

## 2016-07-05 ENCOUNTER — Ambulatory Visit (INDEPENDENT_AMBULATORY_CARE_PROVIDER_SITE_OTHER): Payer: 59 | Admitting: Medical

## 2016-07-05 ENCOUNTER — Ambulatory Visit (HOSPITAL_BASED_OUTPATIENT_CLINIC_OR_DEPARTMENT_OTHER)
Admission: RE | Admit: 2016-07-05 | Discharge: 2016-07-05 | Disposition: A | Discharge status: Home / Self Care | Payer: 59 | Source: Ambulatory Visit | Attending: Medical | Admitting: Medical

## 2016-07-05 ENCOUNTER — Encounter (HOSPITAL_BASED_OUTPATIENT_CLINIC_OR_DEPARTMENT_OTHER): Payer: Self-pay

## 2016-07-05 VITALS — BP 123/52 | HR 83 | Temp 98.4°F | Ht 66.0 in | Wt 163.4 lb

## 2016-07-05 DIAGNOSIS — R110 Nausea: Secondary | ICD-10-CM

## 2016-07-05 DIAGNOSIS — R109 Unspecified abdominal pain: Secondary | ICD-10-CM

## 2016-07-05 DIAGNOSIS — R300 Dysuria: Secondary | ICD-10-CM

## 2016-07-05 DIAGNOSIS — R5383 Other fatigue: Secondary | ICD-10-CM

## 2016-07-05 DIAGNOSIS — K5900 Constipation, unspecified: Secondary | ICD-10-CM

## 2016-07-05 LAB — POC URINALSYSI DIPSTICK (AUTOMATED)
Bilirubin, UA: NEGATIVE
GLUCOSE UA: NEGATIVE
Leukocytes, UA: NEGATIVE
Nitrite, UA: NEGATIVE
PROTEIN UA: NEGATIVE
SPEC GRAV UA: 1.03 (ref 1.030–1.035)
Urobilinogen, UA: NEGATIVE (ref ?–2.0)
pH, UA: 6 (ref 5.0–8.0)

## 2016-07-05 LAB — URINE CULTURE

## 2016-07-05 MED ORDER — PHENAZOPYRIDINE HCL 100 MG PO TABS: 100.0000 mg | ORAL_TABLET | Freq: Three times a day (TID) | ORAL | 0 refills | Status: DC | PRN

## 2016-07-05 MED ORDER — AMOXICILLIN 500 MG PO TABS: 500.0000 mg | ORAL_TABLET | Freq: Two times a day (BID) | ORAL | 0 refills | Status: DC

## 2016-07-05 MED FILL — PHENAZOPYRIDINE 100 MG TAB: 100 | 4 days supply | Qty: 10 | Fill #0

## 2016-07-05 MED FILL — AMOXICILLIN 500 MG CAPSULE: 500 | 7 days supply | Qty: 14 | Fill #0

## 2016-07-05 NOTE — Telephone Encounter (Signed)
Would you mind reviewing lab results?

## 2016-07-05 NOTE — Telephone Encounter (Signed)
Urine culture showed some Group B strep bacteria. This is common in the urinary tract. There is not a lot of bacteria in the urine.   She does not have any kidney stones   Stool culture is not back yet  If she is still having symptoms then I will treat

## 2016-07-05 NOTE — Telephone Encounter (Signed)
Caller name:Tara- Xray tec Relationship to patient: Can be reached:[phone] Pharmacy:  Reason for call:patient did not have abd xray done due to protocol, 63-49 yr old women must have pregnancy test or sign a waiver if still having a active menstrual cycle. Patient wants to wait and see if her cycle starts and will come back after words

## 2016-07-05 NOTE — Telephone Encounter (Signed)
Pt calling to check the status of the lab results and pt is aware that they will give her a call once the lab results are available to the provider.

## 2016-07-05 NOTE — Progress Notes (Signed)
Pre visit review using our clinic tool,if applicable. No additional management support is needed unless otherwise documented below in the visit note.  

## 2016-07-05 NOTE — Progress Notes (Signed)
Subjective:    Patient ID: Joanna Anderson, female    DOB: 22-Apr-1965, 51 y.o.   MRN: [id number]  HPI  Pt in with some lower abdomen area pain. Pain for couple of weeks. Pt states some pain when she urinates. Some chills intermittently. Pt early on thought she had odor to her urine. Pt has some lower abdomen pain. This morning she thought had left lower cva pain. Pt has fibromyalgia and at times seems to discount pains in her body.  Pt feels sweaty at times.   Pt states some intermittent constipation type symptoms(she is on tramadol for fibromygalia). But then states faintly looser stool on Monday. No diffuse abdomen pain or any locallized pain presently.  Pt had no blood work done last visit.  Pt did not do stool culture. She did not do study and stool was more formed.  Pt seen by Duane Lope for kub. But pt did not get the study done.   Pt has hx of diverticulitis years ago in 2010. Pt had colonoscopy 3-4 years ago. Pt father had crohn's disease.  LMP- 3 weeks ago.     Review of Systems  Constitutional: Positive for chills. Negative for fatigue and fever.  Respiratory: Negative for cough, chest tightness, shortness of breath and wheezing.   Cardiovascular: Negative for chest pain and palpitations.  Gastrointestinal: Positive for abdominal pain. Negative for abdominal distention, blood in stool, constipation, diarrhea, nausea, rectal pain and vomiting.       On and off/intermiten.  See hpi states constipated due to tramadol then stated one loose stool on Monday. Now normal bm.  Genitourinary: Positive for dysuria. Negative for decreased urine volume, difficulty urinating, flank pain, frequency, hematuria, urgency, vaginal discharge and vaginal pain.  Musculoskeletal: Negative for back pain.       Early this morning some cva pain but not now.  Neurological: Negative for dizziness, speech difficulty, weakness and light-headedness.   Past Medical History:  Diagnosis Date  .  Anemia   . Anxiety   . ASD (atrial septal defect) 06/11/2014   S/p patch repair  . Atherosclerosis of abdominal aorta (HCC) 06/11/2014   Age advanced atherosclerosis of the infrarenal aorta  . Depression   . Dysmenorrhea   . Dysuria   . Encounter for long-term (current) use of other medications   . Headache   . Hematuria   . High cholesterol   . Left knee pain   . Migraine   . Other malaise and fatigue   . Pure hyperglyceridemia   . Routine general medical examination at a health care facility   . Shoulder pain   . Vitamin D deficiency   . Vitamin D deficiency   . VSD (ventricular septal defect), multiple 06/11/2014   S/p patch repair     Social History   Social History  . Marital status: Married    Spouse name: N/A  . Number of children: 4  . Years of education: College   Occupational History  . RN     [employer]   Social History Main Topics  . Smoking status: Never Smoker  . Smokeless tobacco: Never Used  . Alcohol use No  . Drug use: No  . Sexual activity: Not on file   Other Topics Concern  . Not on file   Social History Narrative   Lives at home with husband and children.   Right-handed.    Past Surgical History:  Procedure Laterality Date  . c seaction    .  CESAREAN SECTION    . KNEE ARTHROSCOPY W/ ACL RECONSTRUCTION AND HAMSTRING GRAFT Left   . vetricular hole      Family History  Problem Relation Age of Onset  . Depression Mother   . High blood pressure Mother   . Hyperlipidemia Mother   . Anxiety disorder Mother   . Hypertension Mother   . High blood pressure Father   . Hyperlipidemia Father   . Hypertension Father   . Crohn's disease Father   . Kidney disease Father   . Peripheral vascular disease Father   . Peripheral vascular disease Paternal Grandfather     Allergies  Allergen Reactions  . Banana Other (See Comments)    Headaches  . Nsaids Nausea Only and Other (See Comments)    Also caused stomach ulcers (can only tolerate in  limited doses)  . Other Other (See Comments)    SSRI(s): Makes the patient feel "disconnected" Tricyclic antidepressants- Muscle spasms Sulfates- Itching  . Sulfa Antibiotics Hives    Current Outpatient Prescriptions on File Prior to Visit  Medication Sig Dispense Refill  . acetaminophen (TYLENOL) 650 MG CR tablet Take 650 mg by mouth every 8 (eight) hours as needed for pain. Reported on 07/01/2015    . baclofen (LIORESAL) 10 MG tablet Take 10 mg by mouth 4 (four) times daily as needed for muscle spasms.    . clonazePAM (KLONOPIN) 0.5 MG tablet Take 1 tablet (0.5 mg total) by mouth 2 (two) times daily as needed for anxiety. 60 tablet 0  . famciclovir (FAMVIR) 500 MG tablet Take 1 tablet (500 mg total) by mouth 3 (three) times daily. 21 tablet 0  . fluticasone (FLONASE) 50 MCG/ACT nasal spray Place 2 sprays into both nostrils daily. 16 g 1  . ibuprofen (ADVIL,MOTRIN) 200 MG tablet Take 200 mg by mouth every 6 (six) hours as needed for moderate pain.     . pantoprazole (PROTONIX) 40 MG tablet Take 1 tablet (40 mg total) by mouth as needed. (Patient taking differently: Take 40 mg by mouth daily as needed (indigestion). ) 90 tablet 1  . promethazine (PHENERGAN) 25 MG tablet Take 1 tablet (25 mg total) by mouth every 8 (eight) hours as needed for nausea or vomiting. 20 tablet 0  . rosuvastatin (CRESTOR) 5 MG tablet Take 1 tablet (5 mg total) by mouth daily. 30 tablet 2  . SUMAtriptan (IMITREX) 50 MG tablet Take 1 tablet (50 mg total) by mouth every 2 (two) hours as needed for migraine. May repeat in 2 hours if headache persists or recurs. (Patient taking differently: Take 50 mg by mouth once. May repeat in 2 hours if headache persists or recurs) 15 tablet 6  . traMADol (ULTRAM) 50 MG tablet Take 1 tablet (50 mg total) by mouth every 8 (eight) hours as needed for severe pain. 90 tablet 0  . meloxicam (MOBIC) 7.5 MG tablet Take 1-2 tablets (7.5-15 mg total) by mouth daily. (Patient not taking: Reported  on 07/05/2016) 60 tablet 1  . pregabalin (LYRICA) 150 MG capsule Take 1 capsule (150 mg total) by mouth 3 (three) times daily. 90 capsule 2   No current facility-administered medications on file prior to visit.     BP (!) 123/52   Pulse 83   Temp 98.4 F (36.9 C) (Oral)   Ht  (1.676 m)   Wt 163 lb 6.4 oz (74.1 kg)   LMP 06/11/2016   SpO2 96%   BMI 26.37 kg/m  Objective:   Physical Exam  General Appearance- Not in acute distress.  HEENT Eyes- Scleraeral/Conjuntiva-bilat- Not Yellow. Mouth & Throat- Normal.  Chest and Lung Exam Auscultation: Breath sounds:-Normal. Adventitious sounds:- No Adventitious sounds.  Cardiovascular Auscultation:Rythm - Regular. Heart Sounds -Normal heart sounds.  Abdomen Inspection:-Inspection Normal.  Palpation/Perucssion: Palpation and Percussion of the abdomen reveal- faint suprapubicTender, No Rebound tenderness, No rigidity(Guarding) and No Palpable abdominal masses.  Liver:-Normal.  Spleen:- Normal.   Back- no cva pain      Assessment & Plan:  For your burning on urination and bacteria found on culture will rx amoxicillin.(also rx of pyridium)  Will repeat you culture and get ancillary studies.  Please get kub today.  Also will get labs and turn in ifob.  If second urine studies negative and your symptoms persist then might need to refer to urologist for interstitial cystitis eval.  Will follow your xray result and ifob test. If abdomen pain worsens consider CT or maybe getting you back with GI as you did have diverticultis years ago.  Follow up in 7 days or as needed  Lola Lofaro, Ramon Dredge, [employer]

## 2016-07-05 NOTE — Patient Instructions (Addendum)
For your burning on urination and bacteria found on culture will rx amoxicillin.(also rx of pyridium)  Will repeat you culture and get ancillary studies.  Please get kub today.  Also will get labs and turn in ifob.  If second urine studies negative and your symptoms persist then might need to refer to urologist for interstitial cystitis eval.  Will follow your xray result and ifob test. If abdomen pain worsens consider CT or maybe getting you back with GI as you did have diverticultis years ago.  Follow up in 7 days or as needed

## 2016-07-06 LAB — URINE CULTURE

## 2016-07-06 LAB — COMPREHENSIVE METABOLIC PANEL
ALK PHOS: 63 U/L (ref 39–117)
ALT: 14 U/L (ref 0–35)
AST: 20 U/L (ref 0–37)
Albumin: 4.1 g/dL (ref 3.5–5.2)
BUN: 10 mg/dL (ref 6–23)
CHLORIDE: 102 meq/L (ref 96–112)
CO2: 31 meq/L (ref 19–32)
Calcium: 9.3 mg/dL (ref 8.4–10.5)
Creatinine, Ser: 0.65 mg/dL (ref 0.40–1.20)
GFR: 103.09 mL/min (ref >60.00)
GLUCOSE: 129 mg/dL — AB (ref 70–99)
POTASSIUM: 3.9 meq/L (ref 3.5–5.1)
SODIUM: 139 meq/L (ref 135–145)
TOTAL PROTEIN: 7.1 g/dL (ref 6.0–8.3)
Total Bilirubin: 0.6 mg/dL (ref 0.2–1.2)

## 2016-07-06 LAB — CBC WITH DIFFERENTIAL/PLATELET
Basophils Absolute: 0 10*3/uL (ref 0.0–0.1)
Basophils Relative: 0.4 % (ref 0.0–3.0)
EOS PCT: 1.1 % (ref 0.0–5.0)
Eosinophils Absolute: 0.1 10*3/uL (ref 0.0–0.7)
HCT: 40.9 % (ref 36.0–46.0)
Hemoglobin: 13.9 g/dL (ref 12.0–15.0)
LYMPHS ABS: 1.9 10*3/uL (ref 0.7–4.0)
Lymphocytes Relative: 20.7 % (ref 12.0–46.0)
MCHC: 33.8 g/dL (ref 30.0–36.0)
MCV: 88.2 fl (ref 78.0–100.0)
MONO ABS: 0.5 10*3/uL (ref 0.1–1.0)
MONOS PCT: 5.7 % (ref 3.0–12.0)
NEUTROS ABS: 6.6 10*3/uL (ref 1.4–7.7)
NEUTROS PCT: 72.1 % (ref 43.0–77.0)
PLATELETS: 206 10*3/uL (ref 150.0–400.0)
RBC: 4.64 Mil/uL (ref 3.87–5.11)
RDW: 13.1 % (ref 11.5–15.5)
WBC: 9.2 10*3/uL (ref 4.0–10.5)

## 2016-07-06 LAB — LIPASE: Lipase: 13 U/L (ref 11.0–59.0)

## 2016-07-06 LAB — AMYLASE: Amylase: 49 U/L (ref 27–131)

## 2016-07-06 NOTE — Telephone Encounter (Signed)
I notified patient via voicemail of these results.

## 2016-07-09 LAB — URINE CYTOLOGY ANCILLARY ONLY
CHLAMYDIA, DNA PROBE: NEGATIVE
NEISSERIA GONORRHEA: NEGATIVE
TRICH (WINDOWPATH): NEGATIVE

## 2016-07-10 ENCOUNTER — Other Ambulatory Visit: Payer: Self-pay | Admitting: Medical

## 2016-07-10 MED ORDER — TRAMADOL HCL 50 MG PO TABS: 50.0000 mg | ORAL_TABLET | Freq: Three times a day (TID) | ORAL | 0 refills | Status: DC | PRN

## 2016-07-10 NOTE — Telephone Encounter (Signed)
Caller name: Sarafina Relation to pt: self  Call back number: [phone] Pharmacy:  Reason for call: Pt requesting needing refill on Tramadol (see note on lab results- 07-05-2016). Please advise.

## 2016-07-10 NOTE — Telephone Encounter (Signed)
Rx printed and place on desk.

## 2016-07-10 NOTE — Telephone Encounter (Signed)
Can you print the tramadol rx for me. Same directions I put on last rx. Place it on my desk and then I will sign it in the am. I usually print from home. But having issue with epic saying I can't print the rx??

## 2016-07-10 NOTE — Telephone Encounter (Signed)
Refill Tramadol  Last RX: 05/21/16 Last OV:07/05/16 Next OV: None UDS:07/04/16 CSC:01/04/16

## 2016-07-11 LAB — URINE CYTOLOGY ANCILLARY ONLY
Bacterial vaginitis: NEGATIVE
Candida vaginitis: NEGATIVE

## 2016-07-11 MED FILL — traMADol HCL 50 MG TABS: 50 | 30 days supply | Qty: 90 | Fill #0 | Status: TO

## 2016-07-11 NOTE — Telephone Encounter (Signed)
Left detailed message on voicemail that we were sending refill to [facility] and also she need to call us back to update [place] on her status.  See lab notes.

## 2016-07-13 ENCOUNTER — Ambulatory Visit (HOSPITAL_BASED_OUTPATIENT_CLINIC_OR_DEPARTMENT_OTHER)
Admission: RE | Admit: 2016-07-13 | Discharge: 2016-07-13 | Disposition: A | Discharge status: Home / Self Care | Payer: 59 | Source: Ambulatory Visit | Attending: Adult Health | Admitting: Adult Health

## 2016-07-13 DIAGNOSIS — R319 Hematuria, unspecified: Secondary | ICD-10-CM | POA: Insufficient documentation

## 2016-07-13 DIAGNOSIS — R102 Pelvic and perineal pain: Secondary | ICD-10-CM | POA: Diagnosis not present

## 2016-07-13 DIAGNOSIS — R1030 Lower abdominal pain, unspecified: Secondary | ICD-10-CM | POA: Diagnosis not present

## 2016-07-18 ENCOUNTER — Telehealth: Payer: Self-pay | Admitting: Medical

## 2016-07-18 NOTE — Telephone Encounter (Signed)
Pt has stool present on xray. Recommend she try miralax or maybe 1 bottle of mag citrate. I do not think phebolith cause of her symptoms. She may need GI referral if her symptoms persist.  She could make appointment and discuss with me further if she wants. I need 30 minute appointment slot when she comes in.

## 2016-07-20 NOTE — Telephone Encounter (Signed)
Notified pt of results. Pt states she went to GI a couple years ago when she had this problem before and they didn't find anything wrong. Pt states that she has to take Miralax almost everyday wants to know if she can get a rx for it so her insurance will cover it or pay for it with health saving account. Pt will call and make an appointment if she gets worse.

## 2016-07-20 NOTE — Telephone Encounter (Signed)
Patient returning call.

## 2016-07-20 NOTE — Telephone Encounter (Signed)
Left message for pt to return my call.

## 2016-07-22 ENCOUNTER — Telehealth: Payer: Self-pay | Admitting: Medical

## 2016-07-22 MED ORDER — POLYETHYLENE GLYCOL 3350 17 GM/SCOOP PO POWD: 17.0000 g | Freq: Every day | ORAL | 1 refills | Status: DC

## 2016-07-22 NOTE — Telephone Encounter (Signed)
miralax rx sent to pharmacy. Will you notify pt. If she has other pharmacy preference. Will you sent miralax to that pharmacy.

## 2016-07-23 MED ORDER — POLYETHYLENE GLYCOL 3350 17 G PO PACK: 17.0000 g | PACK | Freq: Every day | ORAL | 0 refills | Status: DC

## 2016-07-23 NOTE — Addendum Note (Signed)
Addended by: Orlene Och on: 07/23/2016 04:54 PM   Modules accepted: Orders

## 2016-07-23 NOTE — Telephone Encounter (Signed)
Notified pt Miralax sent to pharmacy. Pt prefer [facility]. Rx sent to preferred pharmacy.

## 2016-07-31 ENCOUNTER — Other Ambulatory Visit: Payer: Self-pay | Admitting: *Deleted

## 2016-07-31 MED ORDER — SUMATRIPTAN SUCCINATE 50 MG PO TABS: ORAL_TABLET | ORAL | 1 refills | Status: DC

## 2016-08-13 MED FILL — LYRICA 150 MG CAPSULE: 150 | 30 days supply | Qty: 90 | Fill #1

## 2016-08-17 ENCOUNTER — Other Ambulatory Visit: Payer: Self-pay | Admitting: Occupational Medicine

## 2016-08-17 ENCOUNTER — Ambulatory Visit: Payer: Self-pay

## 2016-08-17 DIAGNOSIS — M25511 Pain in right shoulder: Secondary | ICD-10-CM

## 2016-08-18 ENCOUNTER — Other Ambulatory Visit: Payer: Self-pay | Admitting: Medical

## 2016-08-20 ENCOUNTER — Telehealth: Payer: Self-pay | Admitting: Behavioral Health

## 2016-08-20 NOTE — Telephone Encounter (Signed)
[employer] note received via fax  Call Date: 08/19/16 Time: 3:49 PM   Caller: Joanna Anderson (Self) Return number: [phone]  Nurse: Linwood DibblesMadison West, RN  Chief Complaint: Abdominal Pain  Reason for call: Caller states that's she has blisters on her left lower abdomen, but now scabbed over; it hurts really bad, itchy, hot, tingling/burning.  Guideline: Shingles  Disposition: See Physician within 24 Hours   Called & spoke with the patient regarding the above medical concern. She voiced that she's currently at work, but tolerating the pain. No appointments available in office today. Offered to schedule patient an appointment for tomorrow. Patient stated that she would call back later to schedule once she's aware of the hours she will be working.

## 2016-08-21 ENCOUNTER — Other Ambulatory Visit: Payer: Self-pay | Admitting: Medical

## 2016-08-21 NOTE — Telephone Encounter (Signed)
Relation to pt: self Call back [phone] Pharmacy:  Reason for call:  Patient checking on the status  Of traMADol (ULTRAM) 50 MG tablet refill, please advise

## 2016-08-22 MED ORDER — TRAMADOL HCL 50 MG PO TABS: 50.0000 mg | ORAL_TABLET | Freq: Three times a day (TID) | ORAL | 0 refills | Status: DC | PRN

## 2016-08-22 MED FILL — traMADol HCL 50 MG TABS: 50 | 30 days supply | Qty: 90 | Fill #0

## 2016-08-22 NOTE — Telephone Encounter (Addendum)
Reviewed refill request for tramadol today and signed. [facility] or [facility].  I thought this was the case. Then I saw rx was pending. So double check. I don't want to sign 2 rx.  Let me know. I tried to delete the rx you loaded. Need to know if I signed earlier in day. Did you already have it printed for me?

## 2016-08-22 NOTE — Telephone Encounter (Signed)
Refill Request:Tramadol  Last RX:07/10/16 Last OV:07/05/16 Next ZO:[id number]:None Scheduled  UDS:07/04/16 CSC:01/04/16

## 2016-08-28 NOTE — Telephone Encounter (Signed)
Rx faxed on 08/22/16

## 2016-09-23 ENCOUNTER — Encounter (HOSPITAL_COMMUNITY): Payer: Self-pay | Admitting: Emergency Medicine

## 2016-09-23 ENCOUNTER — Ambulatory Visit (HOSPITAL_COMMUNITY)
Admission: EM | Admit: 2016-09-23 | Discharge: 2016-09-23 | Disposition: A | Discharge status: Home / Self Care | Payer: 59 | Attending: Family Medicine | Admitting: Family Medicine

## 2016-09-23 DIAGNOSIS — R319 Hematuria, unspecified: Secondary | ICD-10-CM | POA: Insufficient documentation

## 2016-09-23 DIAGNOSIS — E781 Pure hyperglyceridemia: Secondary | ICD-10-CM | POA: Diagnosis not present

## 2016-09-23 DIAGNOSIS — G43909 Migraine, unspecified, not intractable, without status migrainosus: Secondary | ICD-10-CM | POA: Diagnosis not present

## 2016-09-23 DIAGNOSIS — R1030 Lower abdominal pain, unspecified: Secondary | ICD-10-CM

## 2016-09-23 DIAGNOSIS — F329 Major depressive disorder, single episode, unspecified: Secondary | ICD-10-CM | POA: Insufficient documentation

## 2016-09-23 DIAGNOSIS — E7800 Pure hypercholesterolemia, unspecified: Secondary | ICD-10-CM | POA: Diagnosis not present

## 2016-09-23 DIAGNOSIS — N946 Dysmenorrhea, unspecified: Secondary | ICD-10-CM | POA: Insufficient documentation

## 2016-09-23 DIAGNOSIS — Q211 Atrial septal defect: Secondary | ICD-10-CM | POA: Diagnosis not present

## 2016-09-23 DIAGNOSIS — F419 Anxiety disorder, unspecified: Secondary | ICD-10-CM | POA: Diagnosis not present

## 2016-09-23 DIAGNOSIS — M545 Low back pain: Secondary | ICD-10-CM | POA: Insufficient documentation

## 2016-09-23 DIAGNOSIS — N390 Urinary tract infection, site not specified: Secondary | ICD-10-CM | POA: Insufficient documentation

## 2016-09-23 DIAGNOSIS — Z3202 Encounter for pregnancy test, result negative: Secondary | ICD-10-CM | POA: Diagnosis not present

## 2016-09-23 DIAGNOSIS — R300 Dysuria: Secondary | ICD-10-CM | POA: Diagnosis not present

## 2016-09-23 DIAGNOSIS — E559 Vitamin D deficiency, unspecified: Secondary | ICD-10-CM | POA: Diagnosis not present

## 2016-09-23 LAB — POCT URINALYSIS DIP (DEVICE)
BILIRUBIN URINE: NEGATIVE
GLUCOSE, UA: NEGATIVE mg/dL
Ketones, ur: NEGATIVE mg/dL
Leukocytes, UA: NEGATIVE
NITRITE: NEGATIVE
PH: 7 (ref 5.0–8.0)
PROTEIN: NEGATIVE mg/dL
Specific Gravity, Urine: 1.02 (ref 1.005–1.030)
Urobilinogen, UA: 0.2 mg/dL (ref 0.0–1.0)

## 2016-09-23 LAB — POCT PREGNANCY, URINE: PREG TEST UR: NEGATIVE

## 2016-09-23 MED ORDER — AMOXICILLIN-POT CLAVULANATE 875-125 MG PO TABS: 1.0000 | ORAL_TABLET | Freq: Two times a day (BID) | ORAL | 0 refills | Status: DC

## 2016-09-23 NOTE — ED Triage Notes (Signed)
[facility] a complaint of lower abdominal pain and lower back pain x 2 weeks. The patient also complained of dysuria and urinary frequency.  The patient also complaint of "inflammed Lymph nodes" in her neck.

## 2016-09-23 NOTE — ED Provider Notes (Signed)
[facility]    CSN: [id number] Arrival date & time: 09/23/16  1522     History   Chief Complaint Chief Complaint  Patient presents with  . Abdominal Pain  . Dysuria    HPI Joanna Anderson is a 51 y.o. female.   The patient presented to the [facility] with a complaint of lower abdominal pain and lower back pain x 2 weeks. The patient also complained of dysuria and urinary frequency.  The patient also complaint of "inflammed Lymph nodes" in her neck.  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 She's been having intermittent lower abdominal pain for over year. She's been diagnosed with urinary tract infection (strep agalactiae) in April despite having a normal urine.  Patient has had a cesarean section in the past. She still has menstrual periods every 28 days and her last period was 20 days ago. She has a history of an ovarian cyst as well.  Patient has been diagnosed with diverticulosis in the past. She's also been diagnosed with fibromyalgia.      Past Medical History:  Diagnosis Date  . Anemia   . Anxiety   . ASD (atrial septal defect) 06/11/2014   S/p patch repair  . Atherosclerosis of abdominal aorta (HCC) 06/11/2014   Age advanced atherosclerosis of the infrarenal aorta  . Depression   . Dysmenorrhea   . Dysuria   . Encounter for long-term (current) use of other medications   . Headache   . Hematuria   . High cholesterol   . Left knee pain   . Migraine   . Other malaise and fatigue   . Pure hyperglyceridemia   . Routine general medical examination at a health care facility   . Shoulder pain   . Vitamin D deficiency   . Vitamin D deficiency   . VSD (ventricular septal defect), multiple 06/11/2014   S/p patch repair    Patient Active Problem List   Diagnosis Date Noted  . Muscle pain 04/19/2015  . Neck pain 02/22/2015  . Abnormality of gait 02/22/2015  . Pelvic pain in female 10/02/2014  . Chest pain 06/11/2014  .  ASD (atrial septal defect) 06/11/2014  . VSD (ventricular septal defect), multiple 06/11/2014  . Atherosclerosis of abdominal aorta (HCC) 06/11/2014  . Other malaise and fatigue   . Shoulder pain   . Dysmenorrhea   . Dysuria   . Hematuria   . Anxiety   . Encounter for long-term (current) use of other medications   . Vitamin D deficiency   . Pure hyperglyceridemia   . Migraine   . Depression     Past Surgical History:  Procedure Laterality Date  . c seaction    . CESAREAN SECTION    . KNEE ARTHROSCOPY W/ ACL RECONSTRUCTION AND HAMSTRING GRAFT Left   . vetricular hole      OB History    No data available       Home Medications    Prior to Admission medications   Medication Sig Start Date End Date Taking? Authorizing Provider  acetaminophen (TYLENOL) 650 MG CR tablet Take 650 mg by mouth every 8 (eight) hours as needed for pain. Reported on 07/01/2015   Yes [provider]  famciclovir (FAMVIR) 500 MG tablet Take 1 tablet (500 mg total) by mouth 3 (three) times daily. 06/04/16  Yes Saguier, Ramon Dredge, PA-C  fluticasone (FLONASE) 50 MCG/ACT nasal spray Place 2 sprays into both nostrils daily. 03/20/16  Yes Saguier, Ramon Dredge, PA-C  pantoprazole (PROTONIX) 40 MG tablet Take 1 tablet (40 mg total) by mouth as needed. Patient taking differently: Take 40 mg by mouth daily as needed (indigestion).  05/30/15  Yes Peyton Najjar, MD  phenazopyridine (PYRIDIUM) 100 MG tablet Take 1 tablet (100 mg total) by mouth 3 (three) times daily as needed for pain. 07/05/16  Yes Saguier, Ramon Dredge, PA-C  polyethylene glycol (MIRALAX / GLYCOLAX) packet Take 17 g by mouth daily. 07/23/16  Yes Saguier, Ramon Dredge, PA-C  promethazine (PHENERGAN) 25 MG tablet Take 1 tablet (25 mg total) by mouth every 8 (eight) hours as needed for nausea or vomiting. 12/21/15  Yes Saguier, Ramon Dredge, PA-C  SUMAtriptan (IMITREX) 50 MG tablet One tab at onset of migraine - may repeat in 2 hrs if needed (max dose: 2 tabs/24 hrs or  15/month).  Please call [phone] to schedule appt for continued refills. 07/31/16  Yes Levert Feinstein, MD  traMADol (ULTRAM) 50 MG tablet Take 1 tablet (50 mg total) by mouth every 8 (eight) hours as needed for severe pain. 08/22/16  Yes Saguier, Ramon Dredge, PA-C  amoxicillin-clavulanate (AUGMENTIN) 875-125 MG tablet Take 1 tablet by mouth every 12 (twelve) hours. 09/23/16   Elvina Sidle, MD  rosuvastatin (CRESTOR) 5 MG tablet Take 1 tablet (5 mg total) by mouth daily. 01/04/16   Saguier, Ramon Dredge, PA-C    Family History Family History  Problem Relation Age of Onset  . Depression Mother   . High blood pressure Mother   . Hyperlipidemia Mother   . Anxiety disorder Mother   . Hypertension Mother   . High blood pressure Father   . Hyperlipidemia Father   . Hypertension Father   . Crohn's disease Father   . Kidney disease Father   . Peripheral vascular disease Father   . Peripheral vascular disease Paternal Grandfather     Social History Social History  Substance Use Topics  . Smoking status: Never Smoker  . Smokeless tobacco: Never Used  . Alcohol use No     Allergies   Banana; Nsaids; Other; and Sulfa antibiotics   Review of Systems Review of Systems  Gastrointestinal: Positive for abdominal pain.  Genitourinary: Positive for dysuria and frequency.  All other systems reviewed and are negative.    Physical Exam Triage Vital Signs ED Triage Vitals  Enc Vitals Group     BP 09/23/16 1544 133/63     Pulse Rate 09/23/16 1544 77     Resp 09/23/16 1544 18     Temp 09/23/16 1544 98.3 F (36.8 C)     Temp Source 09/23/16 1544 Oral     SpO2 09/23/16 1544 98 %     Weight --      Height --      Head Circumference --      Peak Flow --      Pain Score 09/23/16 1541 8     Pain Loc --      Pain Edu? --      Excl. in GC? --    No data found.   Updated Vital Signs BP 133/63 (BP Location: Right Arm)   Pulse 77   Temp 98.3 F (36.8 C) (Oral)   Resp 18   LMP 09/02/2016   SpO2  98%    Physical Exam  Constitutional: She is oriented to person, place, and time. She appears well-developed and well-nourished.  HENT:  Right Ear: External ear normal.  Left Ear: External ear normal.  Eyes: Conjunctivae are normal.  Pupils are equal, round, and reactive to light.  Neck: Normal range of motion. Neck supple.  Cardiovascular: Normal rate.   Pulmonary/Chest: Effort normal.  Abdominal: Soft. Bowel sounds are normal.  Mild tenderness with deep palpation of the suprapubic and both lower quadrants. Patient is active bowel sounds.  There are no masses, guarding or rebound. There is no hepatosplenomegaly.  Musculoskeletal: Normal range of motion.  Lymphadenopathy:    She has no cervical adenopathy.  Neurological: She is alert and oriented to person, place, and time.  Skin: Skin is warm and dry.  Nursing note and vitals reviewed.    UC Treatments / Results  Labs (all labs ordered are listed, but only abnormal results are displayed) Labs Reviewed  POCT URINALYSIS DIP (DEVICE) - Abnormal; Notable for the following:       Result Value   Hgb urine dipstick TRACE (*)    All other components within normal limits  URINE CULTURE  POCT PREGNANCY, URINE    EKG  EKG Interpretation None       Radiology No results found.  Procedures Procedures (including critical care time)  Medications Ordered in UC Medications - No data to display   Initial Impression / Assessment and Plan / UC Course  I have reviewed the triage vital signs and the nursing notes.  Pertinent labs & imaging results that were available during my care of the patient were reviewed by me and considered in my medical decision making (see chart for details).     Final Clinical Impressions(s) / UC Diagnoses   Final diagnoses:  Lower abdominal pain    New Prescriptions New Prescriptions   AMOXICILLIN-CLAVULANATE (AUGMENTIN) 875-125 MG TABLET    Take 1 tablet by mouth every 12 (twelve) hours.      Elvina SidleLauenstein, Vala Raffo, MD 09/23/16 1623

## 2016-09-24 ENCOUNTER — Encounter (HOSPITAL_COMMUNITY): Payer: Self-pay

## 2016-09-24 ENCOUNTER — Emergency Department (HOSPITAL_COMMUNITY)
Admission: EM | Admit: 2016-09-24 | Discharge: 2016-09-24 | Disposition: A | Discharge status: Home / Self Care | Payer: 59 | Attending: Emergency Medicine | Admitting: Emergency Medicine

## 2016-09-24 ENCOUNTER — Emergency Department (HOSPITAL_COMMUNITY): Payer: 59

## 2016-09-24 DIAGNOSIS — R1084 Generalized abdominal pain: Secondary | ICD-10-CM

## 2016-09-24 DIAGNOSIS — R1010 Upper abdominal pain, unspecified: Secondary | ICD-10-CM | POA: Diagnosis present

## 2016-09-24 DIAGNOSIS — R109 Unspecified abdominal pain: Secondary | ICD-10-CM | POA: Diagnosis not present

## 2016-09-24 DIAGNOSIS — N3001 Acute cystitis with hematuria: Secondary | ICD-10-CM | POA: Diagnosis not present

## 2016-09-24 LAB — URINALYSIS, ROUTINE W REFLEX MICROSCOPIC
BILIRUBIN URINE: NEGATIVE
Glucose, UA: NEGATIVE mg/dL
KETONES UR: NEGATIVE mg/dL
LEUKOCYTES UA: NEGATIVE
Nitrite: NEGATIVE
Protein, ur: NEGATIVE mg/dL
Specific Gravity, Urine: 1.021 (ref 1.005–1.030)
pH: 5 (ref 5.0–8.0)

## 2016-09-24 LAB — COMPREHENSIVE METABOLIC PANEL
ALBUMIN: 4.2 g/dL (ref 3.5–5.0)
ALT: 18 U/L (ref 14–54)
AST: 25 U/L (ref 15–41)
Alkaline Phosphatase: 65 U/L (ref 38–126)
Anion gap: 11 (ref 5–15)
BILIRUBIN TOTAL: 0.7 mg/dL (ref 0.3–1.2)
BUN: 10 mg/dL (ref 6–20)
CO2: 26 mmol/L (ref 22–32)
Calcium: 9.1 mg/dL (ref 8.9–10.3)
Chloride: 102 mmol/L (ref 101–111)
Creatinine, Ser: 0.67 mg/dL (ref 0.44–1.00)
GFR calc non Af Amer: >60 mL/min (ref >60)
GLUCOSE: 141 mg/dL — AB (ref 65–99)
POTASSIUM: 3.7 mmol/L (ref 3.5–5.1)
SODIUM: 139 mmol/L (ref 135–145)
TOTAL PROTEIN: 7.3 g/dL (ref 6.5–8.1)

## 2016-09-24 LAB — URINE CYTOLOGY ANCILLARY ONLY
Chlamydia: NEGATIVE
Neisseria Gonorrhea: NEGATIVE
Trichomonas: NEGATIVE

## 2016-09-24 LAB — CBC
HEMATOCRIT: 43.5 % (ref 36.0–46.0)
Hemoglobin: 14.8 g/dL (ref 12.0–15.0)
MCH: 30.3 pg (ref 26.0–34.0)
MCHC: 34 g/dL (ref 30.0–36.0)
MCV: 89.1 fL (ref 78.0–100.0)
Platelets: 185 10*3/uL (ref 150–400)
RBC: 4.88 MIL/uL (ref 3.87–5.11)
RDW: 12.8 % (ref 11.5–15.5)
WBC: 21.4 10*3/uL — AB (ref 4.0–10.5)

## 2016-09-24 LAB — I-STAT BETA HCG BLOOD, ED (MC, WL, AP ONLY)

## 2016-09-24 LAB — LIPASE, BLOOD: Lipase: 25 U/L (ref 11–51)

## 2016-09-24 MED ORDER — ONDANSETRON 4 MG PO TBDP: 4.0000 mg | ORAL_TABLET | Freq: Three times a day (TID) | ORAL | 0 refills | Status: DC | PRN

## 2016-09-24 MED ORDER — PHENAZOPYRIDINE HCL 200 MG PO TABS: 200.0000 mg | ORAL_TABLET | Freq: Three times a day (TID) | ORAL | 0 refills | Status: DC | PRN

## 2016-09-24 MED ORDER — IOPAMIDOL (ISOVUE-300) INJECTION 61%
INTRAVENOUS | Status: AC
  Administered 2016-09-24: 100 mL
  Filled 2016-09-24: qty 100

## 2016-09-24 MED ORDER — ONDANSETRON 4 MG PO TBDP
4.0000 mg | ORAL_TABLET | Freq: Once | ORAL | Status: AC
  Administered 2016-09-24: 4 mg via ORAL
  Filled 2016-09-24: qty 1

## 2016-09-24 MED FILL — AMOX-CLAV 875-125 MG TABLET: 875-125 | 7 days supply | Qty: 14 | Fill #0

## 2016-09-24 NOTE — ED Triage Notes (Signed)
Pt states she has had abd pain with nausea X2 days. She was seen by PCP and given Augmentin. Pt states pain has gotten worse. Today she states she feels weak and short of breath. Pt states she feels like she could pass out. Skin warm and dry, vitals stable.

## 2016-09-24 NOTE — ED Provider Notes (Signed)
[facility]-EMERGENCY DEPT Provider Note   CSN: [id number] Arrival date & time: 09/24/16  1335  History   Chief Complaint Chief Complaint  Patient presents with  . Nausea  . Near Syncope    HPI Joanna Anderson is a 51 y.o. female.  Patient reports generalized abdominal pain that has been ongoing for months.  She describes abdominal pain as wrapping around her waist and radiating from upper abdomen to her groin area.  She reports that she has been "run through the ringer" with workup of this pain.  She reports that [facility] of symptoms. She reports it feels like when she had a UTI but worse.  She reports associated nausea and vomiting today at work.  She is a [employer] nurse.  She reports she was started on Augmentin yesterday for concerns for diverticulitis.  She notes she has tolerated this medication without difficulty in the past.  She denies hematemesis, hematochezia, fevers, cough, congestion, abnormal vaginal discharge/ bleeding, diarrhea, constipation.  She reports dysuria, frequency.  No hematuria.  She reports a h/o HLD and fibromyalgia.     Past Medical History:  Diagnosis Date  . Anemia   . Anxiety   . ASD (atrial septal defect) 06/11/2014   S/p patch repair  . Atherosclerosis of abdominal aorta (HCC) 06/11/2014   Age advanced atherosclerosis of the infrarenal aorta  . Depression   . Dysmenorrhea   . Dysuria   . Encounter for long-term (current) use of other medications   . Headache   . Hematuria   . High cholesterol   . Left knee pain   . Migraine   . Other malaise and fatigue   . Pure hyperglyceridemia   . Routine general medical examination at a health care facility   . Shoulder pain   . Vitamin D deficiency   . Vitamin D deficiency   . VSD (ventricular septal defect), multiple 06/11/2014   S/p patch repair    Patient Active Problem List   Diagnosis Date Noted  . Muscle pain 04/19/2015  . Neck pain 02/22/2015  . Abnormality of  gait 02/22/2015  . Pelvic pain in female 10/02/2014  . Chest pain 06/11/2014  . ASD (atrial septal defect) 06/11/2014  . VSD (ventricular septal defect), multiple 06/11/2014  . Atherosclerosis of abdominal aorta (HCC) 06/11/2014  . Other malaise and fatigue   . Shoulder pain   . Dysmenorrhea   . Dysuria   . Hematuria   . Anxiety   . Encounter for long-term (current) use of other medications   . Vitamin D deficiency   . Pure hyperglyceridemia   . Migraine   . Depression     Past Surgical History:  Procedure Laterality Date  . c seaction    . CESAREAN SECTION    . KNEE ARTHROSCOPY W/ ACL RECONSTRUCTION AND HAMSTRING GRAFT Left   . vetricular hole      OB History    No data available      Home Medications    Prior to Admission medications   Medication Sig Start Date End Date Taking? Authorizing Provider  acetaminophen (TYLENOL) 650 MG CR tablet Take 650 mg by mouth every 8 (eight) hours as needed for pain. Reported on 07/01/2015    [provider]  amoxicillin-clavulanate (AUGMENTIN) 875-125 MG tablet Take 1 tablet by mouth every 12 (twelve) hours. 09/23/16   Elvina SidleLauenstein, Kurt, MD  famciclovir (FAMVIR) 500 MG tablet Take 1 tablet (500 mg total) by  mouth 3 (three) times daily. 06/04/16   Saguier, Ramon Dredge, PA-C  fluticasone (FLONASE) 50 MCG/ACT nasal spray Place 2 sprays into both nostrils daily. 03/20/16   Saguier, Ramon Dredge, PA-C  pantoprazole (PROTONIX) 40 MG tablet Take 1 tablet (40 mg total) by mouth as needed. Patient taking differently: Take 40 mg by mouth daily as needed (indigestion).  05/30/15   Peyton Najjar, MD  phenazopyridine (PYRIDIUM) 100 MG tablet Take 1 tablet (100 mg total) by mouth 3 (three) times daily as needed for pain. 07/05/16   Saguier, Ramon Dredge, PA-C  polyethylene glycol (MIRALAX / GLYCOLAX) packet Take 17 g by mouth daily. 07/23/16   Saguier, Ramon Dredge, PA-C  promethazine (PHENERGAN) 25 MG tablet Take 1 tablet (25 mg total) by mouth every 8 (eight) hours as  needed for nausea or vomiting. 12/21/15   Saguier, Ramon Dredge, PA-C  rosuvastatin (CRESTOR) 5 MG tablet Take 1 tablet (5 mg total) by mouth daily. 01/04/16   Saguier, Ramon Dredge, PA-C  SUMAtriptan (IMITREX) 50 MG tablet One tab at onset of migraine - may repeat in 2 hrs if needed (max dose: 2 tabs/24 hrs or 15/month).  Please call [phone] to schedule appt for continued refills. 07/31/16   Levert Feinstein, MD  traMADol (ULTRAM) 50 MG tablet Take 1 tablet (50 mg total) by mouth every 8 (eight) hours as needed for severe pain. 08/22/16   Saguier, Ramon Dredge, PA-C    Family History Family History  Problem Relation Age of Onset  . Depression Mother   . High blood pressure Mother   . Hyperlipidemia Mother   . Anxiety disorder Mother   . Hypertension Mother   . High blood pressure Father   . Hyperlipidemia Father   . Hypertension Father   . Crohn's disease Father   . Kidney disease Father   . Peripheral vascular disease Father   . Peripheral vascular disease Paternal Grandfather     Social History Social History  Substance Use Topics  . Smoking status: Never Smoker  . Smokeless tobacco: Never Used  . Alcohol use No     Allergies   Banana; Nsaids; Other; and Sulfa antibiotics   Review of Systems Review of Systems  Constitutional: Positive for chills. Negative for fever. Fatigue: TMax 81F.  HENT: Negative for congestion and rhinorrhea.   Respiratory: Negative for cough, chest tightness, shortness of breath and wheezing.   Cardiovascular: Negative for chest pain.  Gastrointestinal: Positive for abdominal pain, nausea and vomiting. Negative for blood in stool, constipation and diarrhea.  Genitourinary: Positive for dysuria and frequency. Negative for hematuria, vaginal bleeding and vaginal discharge.  Neurological: Positive for light-headedness. Negative for dizziness, syncope and weakness.     Physical Exam Updated Vital Signs BP 115/63 (BP Location: Left Arm)   Pulse 81   Temp 97.7 F (36.5 C)  (Oral)   Resp 16   Ht 5\' 6"  (1.676 m)   Wt 72.6 kg (160 lb)   LMP 09/02/2016   SpO2 98%   BMI 25.82 kg/m   Physical Exam  Constitutional: She is oriented to person, place, and time. She appears well-developed and well-nourished. No distress.  HENT:  Head: Normocephalic and atraumatic.  Eyes: Conjunctivae and EOM are normal. Pupils are equal, round, and reactive to light. No scleral icterus.  Neck: Normal range of motion. Neck supple.  Cardiovascular: Normal rate, regular rhythm, normal heart sounds and intact distal pulses.   No murmur heard. Pulmonary/Chest: Effort normal and breath sounds normal. No respiratory distress. She has no wheezes.  Abdominal:  Soft. Bowel sounds are normal. She exhibits no distension. There is no tenderness. There is no rigidity, no rebound, no guarding, no tenderness at McBurney's point and negative Murphy's sign.  Musculoskeletal: Normal range of motion. She exhibits no edema.  Lymphadenopathy:    She has no cervical adenopathy.  Neurological: She is alert and oriented to person, place, and time.  Skin: Skin is warm. Capillary refill takes less than 2 seconds. She is not diaphoretic.  Psychiatric: Her speech is normal. Judgment and thought content normal. Her affect is blunt. She is withdrawn. She is not actively hallucinating.     ED Treatments / Results  Labs (all labs ordered are listed, but only abnormal results are displayed) Labs Reviewed  CBC - Abnormal; Notable for the following:       Result Value   WBC 21.4 (*)    All other components within normal limits  COMPREHENSIVE METABOLIC PANEL - Abnormal; Notable for the following:    Glucose, Bld 141 (*)    All other components within normal limits  LIPASE, BLOOD  URINALYSIS, ROUTINE W REFLEX MICROSCOPIC  I-STAT BETA HCG BLOOD, ED ([facility], [facility], [facility] ONLY)  CBG MONITORING, ED    EKG  EKG Interpretation None       Radiology Ct Abdomen Pelvis W Contrast  Result Date: 09/24/2016 CLINICAL  DATA:  Generalized abdominal pain for 2 weeks EXAM: CT ABDOMEN AND PELVIS WITH CONTRAST TECHNIQUE: Multidetector CT imaging of the abdomen and pelvis was performed using the standard protocol following bolus administration of intravenous contrast. CONTRAST:  ISOVUE-300 IOPAMIDOL (ISOVUE-300) INJECTION 61% COMPARISON:  07/13/2016, 01/20/2009 FINDINGS: Lower chest: Lung bases demonstrate no acute consolidation or pleural effusion. Normal heart size. Hepatobiliary: No focal liver abnormality is seen. No gallstones, gallbladder wall thickening, or biliary dilatation. Pancreas: Unremarkable. No pancreatic ductal dilatation or surrounding inflammatory changes. Spleen: Normal in size without focal abnormality. Adrenals/Urinary Tract: Adrenal glands are unremarkable. Kidneys are normal, without renal calculi, focal lesion, or hydronephrosis. Bladder is unremarkable. Stomach/Bowel: Stomach is within normal limits. Appendix appears normal. No evidence of bowel wall thickening, distention, or inflammatory changes. Vascular/Lymphatic: No significant vascular findings are present. No enlarged abdominal or pelvic lymph nodes. Reproductive: Uterus unremarkable. 1.5 cm probable cyst in the left ovary. Other: Small free fluid in the pelvis. No free air. Small fat in the umbilicus. Musculoskeletal: No acute or significant osseous findings. IMPRESSION: No CT evidence for acute intra-abdominal or pelvic pathology. Small amount of free fluid in the pelvis. Electronically Signed   By: Jasmine Pang M.D.   On: 09/24/2016 21:20    Procedures Procedures (including critical care time)  Medications Ordered in ED Medications - No data to display  Initial Impression / Assessment and Plan / ED Course  I have reviewed the triage vital signs and the nursing notes.  Pertinent labs & imaging results that were available during my care of the patient were reviewed by me and considered in my medical decision making (see chart for  details).    1934: Exam fairly unremarkable but her symptoms are concerning for diverticulitis.  VSS and she is afebrile.  She has WBC 21.4 in setting of <24h of Augmentin.  CT abd/pelvis w/ contrast ordered.    2130: CT abd/ pelvis with small amount of free fluid in pelvis.  Otherwise unremarkable.  UA with small hgb, rare bacteria, squamous cell epithelium.   Zofran ordered.  Will PO challenge.  Final Clinical Impressions(s) / ED Diagnoses   Final diagnoses:  Generalized  abdominal pain  Acute cystitis with hematuria   JANETH TERRY is a 51 y.o. female that presented for generalized abdominal pain, nausea, vomiting.  Her WBC was 21.4.  There was concern for diverticulitis vs other intraabdominal pathology, so CT abd/ pelvis w/ contrast was ordered.  This demonstrated a small amount of free fluid in her pelvis but no pathology to explain symptoms/ WBC.  Her UA demonstrated some evidence of infection but again, imaging did not show hydronephrosis.  There was no evidence of pyelonephrosis.  She had no pulmonary symptoms/ concerns.  CMP with normal renal function.  Lipase normal.  She did mention concerns for supraclavicular/ cervical LAD.  These were not palpable on my exam.  However, would consider repeating CBC to evaluate for resolution of leukocytosis.  If persistent elevation, could consider work up for possible lymphoma/ blood dyscrasia.  She was given Zofran and PO challenged.  She tolerated this well.  Discharge instructions and return precautions reviewed with patient and her husband who was at bedside.  She was discharged in stable condition with her husband home.  She will schedule follow up with her PCP to be seen in 3 days.   New Prescriptions New Prescriptions   ONDANSETRON (ZOFRAN ODT) 4 MG DISINTEGRATING TABLET    Take 1 tablet (4 mg total) by mouth every 8 (eight) hours as needed for nausea or vomiting.   PHENAZOPYRIDINE (PYRIDIUM) 200 MG TABLET    Take 1 tablet (200 mg total) by  mouth 3 (three) times daily as needed for pain.     Raliegh Ip, DO 09/24/16 2209    Clarene Duke Ambrose Finland, MD 09/27/16 [phone]

## 2016-09-24 NOTE — ED Notes (Signed)
Pt departed in NAD, refused use of wheelchair.  

## 2016-09-24 NOTE — ED Notes (Addendum)
Pt took 1000mg  tylenol from personal meds on hand for her reported h/a. Given ice water and crackers for PO challenge.

## 2016-09-24 NOTE — Discharge Instructions (Signed)
You were seen in the ED for abdominal pain, nausea, vomiting.  You had an elevation in your WBC.  However there was no evidence of intraabdominal infection/ processes.  Your urine did show evidence of infection.  Given your history of GBS in your urine, you should continue Augmentin.  You should follow up with your primary care doctor in the next couple of days for reevaluation and repeat CBC (labs).  You are being discharged with Zofran to use as needed for nausea and Pyridium for bladder pain.  This medication may turn your urine orange.  If you are unable to stay hydrated, you develop fevers, blood in your vomit/ stool or worsening abdominal pain, seek immediate medical attention.

## 2016-09-24 NOTE — ED Notes (Addendum)
ED Provider at bedside. 

## 2016-09-25 ENCOUNTER — Telehealth: Payer: Self-pay | Admitting: Medical

## 2016-09-25 LAB — URINE CULTURE: Culture: 10000 — AB

## 2016-09-25 NOTE — Telephone Encounter (Signed)
Pt seen in ED. Sheis  always very complicated. She needs 30 minute appointment if she calls for follow up. Offer her day other than Thursday. Particularly avoid late Thursday.

## 2016-09-26 ENCOUNTER — Encounter: Payer: Self-pay | Admitting: Medical

## 2016-09-26 ENCOUNTER — Ambulatory Visit (INDEPENDENT_AMBULATORY_CARE_PROVIDER_SITE_OTHER): Payer: 59 | Admitting: Medical

## 2016-09-26 ENCOUNTER — Telehealth: Payer: Self-pay | Admitting: Internal Medicine

## 2016-09-26 VITALS — BP 106/45 | HR 72 | Temp 98.2°F | Resp 16 | Ht 66.0 in | Wt 163.4 lb

## 2016-09-26 DIAGNOSIS — Z1239 Encounter for other screening for malignant neoplasm of breast: Secondary | ICD-10-CM

## 2016-09-26 DIAGNOSIS — D72829 Elevated white blood cell count, unspecified: Secondary | ICD-10-CM

## 2016-09-26 DIAGNOSIS — R300 Dysuria: Secondary | ICD-10-CM | POA: Diagnosis not present

## 2016-09-26 DIAGNOSIS — Z113 Encounter for screening for infections with a predominantly sexual mode of transmission: Secondary | ICD-10-CM

## 2016-09-26 DIAGNOSIS — Z1231 Encounter for screening mammogram for malignant neoplasm of breast: Secondary | ICD-10-CM | POA: Diagnosis not present

## 2016-09-26 DIAGNOSIS — N3000 Acute cystitis without hematuria: Secondary | ICD-10-CM | POA: Diagnosis not present

## 2016-09-26 DIAGNOSIS — R5383 Other fatigue: Secondary | ICD-10-CM

## 2016-09-26 LAB — CBC WITH DIFFERENTIAL/PLATELET
BASOS PCT: 0 %
Basophils Absolute: 0 cells/uL (ref 0–200)
EOS PCT: 2 %
Eosinophils Absolute: 212 cells/uL (ref 15–500)
HCT: 40.6 % (ref 35.0–45.0)
Hemoglobin: 13.5 g/dL (ref 11.7–15.5)
Lymphocytes Relative: 25 %
Lymphs Abs: 2650 cells/uL (ref 850–3900)
MCH: 29.5 pg (ref 27.0–33.0)
MCHC: 33.3 g/dL (ref 32.0–36.0)
MCV: 88.8 fL (ref 80.0–100.0)
MONOS PCT: 10 %
MPV: 10.2 fL (ref 7.5–12.5)
Monocytes Absolute: 1060 cells/uL — ABNORMAL HIGH (ref 200–950)
NEUTROS ABS: 6678 {cells}/uL (ref 1500–7800)
Neutrophils Relative %: 63 %
PLATELETS: 191 10*3/uL (ref 140–400)
RBC: 4.57 MIL/uL (ref 3.80–5.10)
RDW: 13.5 % (ref 11.0–15.0)
WBC: 10.6 10*3/uL (ref 3.8–10.8)

## 2016-09-26 LAB — POC URINALSYSI DIPSTICK (AUTOMATED)
BILIRUBIN UA: NEGATIVE
Glucose, UA: NEGATIVE
Ketones, UA: NEGATIVE
Leukocytes, UA: NEGATIVE
NITRITE UA: NEGATIVE
PH UA: 6 (ref 5.0–8.0)
PROTEIN UA: NEGATIVE
Spec Grav, UA: 1.025 (ref 1.010–1.025)
Urobilinogen, UA: 0.2 E.U./dL

## 2016-09-26 LAB — URINE CYTOLOGY ANCILLARY ONLY: Candida vaginitis: NEGATIVE

## 2016-09-26 NOTE — Progress Notes (Signed)
Subjective:    Patient ID: Joanna Anderson, female    DOB: [birth year], 51 y.o.   MRN: [id number]  HPI   Pt in for evaluation.  Pt was sick just recently. She has been sick since last Thursday.(then states did not felt tired more than usual for 2 weeks.)  Pt states she felt body aches, sweating, and fatigue. Pt had no sore throat. Pt had work up 2 days in a row before she went to urgent care. She went to urgent care and ED. Pt has was evaluated and she was treated for diverticulitis.The next day had chills and sweats then went to Ed.  She had complaint of lower abdomen pain, frequent urination, and dysuria at urgent care. Pt has hx of group b strep agalactae. She had these symptoms initial presentation at the urgent care. Urine culture grew of 10,000 strep(see type in lab section)  Pt had extensive work up when she went to ED. Wbc was elevated. Urine ancillary was negative. Urine culture showed 10,00 strep agalactiae.  Cbc 2 days ago was 21.4. Pt has been on augmentin. Pt also given rx for zofran. Pancrease enzymes were normal. Ct of abdomen was negative.  Pt was told cause of infection and wbc was from strep.   Pt states she has had colonoscopy in the past. Negative work up in the past about 3 years ago. [facility] Endoscopy done and showed ulcer. But h pylori was negative.  h pylori in 01-2016.     Review of Systems  Constitutional: Positive for fatigue. Negative for chills and fever.       See hpi. But no fever, chills or sweats now.  HENT: Negative for congestion, ear pain and sore throat.   Respiratory: Negative for cough.   Cardiovascular: Negative for chest pain and palpitations.  Gastrointestinal: Positive for nausea. Negative for abdominal distention, blood in stool, diarrhea and vomiting.  Genitourinary: Positive for dysuria and frequency. Negative for pelvic pain, urgency, vaginal bleeding, vaginal discharge and vaginal pain.       Suprapubic pain.    Musculoskeletal: Negative for arthralgias and back pain.  Skin: Negative for rash.  Neurological: Negative for dizziness, syncope, weakness and headaches.  Hematological: Negative for adenopathy. Does not bruise/bleed easily.  Psychiatric/Behavioral: Negative for behavioral problems, decreased concentration, hallucinations and suicidal ideas. The patient is not nervous/anxious.    Past Medical History:  Diagnosis Date  . Anemia   . Anxiety   . ASD (atrial septal defect) 06/11/2014   S/p patch repair  . Atherosclerosis of abdominal aorta (HCC) 06/11/2014   Age advanced atherosclerosis of the infrarenal aorta  . Depression   . Dysmenorrhea   . Dysuria   . Encounter for long-term (current) use of other medications   . Headache   . Hematuria   . High cholesterol   . Left knee pain   . Migraine   . Other malaise and fatigue   . Pure hyperglyceridemia   . Routine general medical examination at a health care facility   . Shoulder pain   . Vitamin D deficiency   . Vitamin D deficiency   . VSD (ventricular septal defect), multiple 06/11/2014   S/p patch repair     Social History   Social History  . Marital status: Married    Spouse name: N/A  . Number of children: 4  . Years of education: College   Occupational History  . RN     [employer]   Social History  Main Topics  . Smoking status: Never Smoker  . Smokeless tobacco: Never Used  . Alcohol use No  . Drug use: No  . Sexual activity: Not on file   Other Topics Concern  . Not on file   Social History Narrative   Lives at home with husband and children.   Right-handed.    Past Surgical History:  Procedure Laterality Date  . c seaction    . CESAREAN SECTION    . KNEE ARTHROSCOPY W/ ACL RECONSTRUCTION AND HAMSTRING GRAFT Left   . vetricular hole      Family History  Problem Relation Age of Onset  . Depression Mother   . High blood pressure Mother   . Hyperlipidemia Mother   . Anxiety disorder Mother   .  Hypertension Mother   . High blood pressure Father   . Hyperlipidemia Father   . Hypertension Father   . Crohn's disease Father   . Kidney disease Father   . Peripheral vascular disease Father   . Peripheral vascular disease Paternal Grandfather     Allergies  Allergen Reactions  . Banana Other (See Comments)    Headaches  . Nsaids Nausea Only and Other (See Comments)    Also caused stomach ulcers (can only tolerate in limited doses)  . Other Other (See Comments)    SSRI(s): Makes the patient feel "disconnected" Tricyclic antidepressants- Muscle spasms Sulfates- Itching  . Sulfa Antibiotics Hives    Current Outpatient Prescriptions on File Prior to Visit  Medication Sig Dispense Refill  . acetaminophen (TYLENOL) 650 MG CR tablet Take 650 mg by mouth every 8 (eight) hours as needed for pain. Reported on 07/01/2015    . amoxicillin-clavulanate (AUGMENTIN) 875-125 MG tablet Take 1 tablet by mouth every 12 (twelve) hours. 14 tablet 0  . famciclovir (FAMVIR) 500 MG tablet Take 1 tablet (500 mg total) by mouth 3 (three) times daily. 21 tablet 0  . fluticasone (FLONASE) 50 MCG/ACT nasal spray Place 2 sprays into both nostrils daily. 16 g 1  . ondansetron (ZOFRAN ODT) 4 MG disintegrating tablet Take 1 tablet (4 mg total) by mouth every 8 (eight) hours as needed for nausea or vomiting. 10 tablet 0  . pantoprazole (PROTONIX) 40 MG tablet Take 1 tablet (40 mg total) by mouth as needed. (Patient taking differently: Take 40 mg by mouth daily as needed (indigestion). ) 90 tablet 1  . phenazopyridine (PYRIDIUM) 200 MG tablet Take 1 tablet (200 mg total) by mouth 3 (three) times daily as needed for pain. 6 tablet 0  . polyethylene glycol (MIRALAX / GLYCOLAX) packet Take 17 g by mouth daily. 14 each 0  . promethazine (PHENERGAN) 25 MG tablet Take 1 tablet (25 mg total) by mouth every 8 (eight) hours as needed for nausea or vomiting. 20 tablet 0  . rosuvastatin (CRESTOR) 5 MG tablet Take 1 tablet (5  mg total) by mouth daily. 30 tablet 2  . SUMAtriptan (IMITREX) 50 MG tablet One tab at onset of migraine - may repeat in 2 hrs if needed (max dose: 2 tabs/24 hrs or 15/month).  Please call [phone] to schedule appt for continued refills. 15 tablet 1  . traMADol (ULTRAM) 50 MG tablet Take 1 tablet (50 mg total) by mouth every 8 (eight) hours as needed for severe pain. 90 tablet 0   No current facility-administered medications on file prior to visit.     BP (!) 106/45 (BP Location: Left Arm, Patient Position: Sitting, Cuff Size: Normal)  Pulse 72   Temp 98.2 F (36.8 C) (Oral)   Resp 16   Ht 5\' 6"  (1.676 m)   Wt 163 lb 6.4 oz (74.1 kg)   LMP 09/02/2016   SpO2 98%   BMI 26.37 kg/m      Objective:   Physical Exam  General  Mental Status - Alert. General Appearance - Well groomed. Not in acute distress.  Skin Rashes- No Rashes.  HEENT Head- Normal. Ear Auditory Canal - Left- Normal. Right - Normal.Tympanic Membrane- Left- Normal. Right- Normal. Eye Sclera/Conjunctiva- Left- Normal. Right- Normal. Nose & Sinuses Nasal Mucosa- Left-  Boggy and Congested. Right-  Boggy and  Congested.Bilateral maxillary and frontal sinus pressure. Mouth & Throat Lips: Upper Lip- Normal: no dryness, cracking, pallor, cyanosis, or vesicular eruption. Lower Lip-Normal: no dryness, cracking, pallor, cyanosis or vesicular eruption. Buccal Mucosa- Bilateral- No Aphthous ulcers. Oropharynx- No Discharge or Erythema. Tonsils: Characteristics- Bilateral- No Erythema or Congestion. Size/Enlargement- Bilateral- No enlargement. Discharge- bilateral-None.  Neck Neck- Supple. No Masses.   Chest and Lung Exam Auscultation: Breath Sounds:-Clear even and unlabored.  Cardiovascular Auscultation:Rythm- Regular, rate and rhythm. Murmurs & Other Heart Sounds:Ausculatation of the heart reveal- No Murmurs.  Lymphatic Head & Neck General Head & Neck Lymphatics:none In region of left supraclavicular area  some fullness. I don't feel obvious definite node.   Abdomen Inspection:-Inspection Normal.  Palpation/Perucssion: Palpation and Percussion of the abdomen reveal- faint suprapubic  Tenderness, No Rebound tenderness, No rigidity(Guarding) and No Palpable abdominal masses.  Liver:-Normal.  Spleen:- Normal.   Back- no cva tenderness        Assessment & Plan:  For your recent severe fatigue, elevated wbc, and frequent urination will order cbc, hiv, cmp, epstein barr panel and urine culture.  Continue the augmentin pending culture results. Will see if strep still present.  Mammogram order placed today.  Will follow your results of above. If all study negative and wbc still elevated would consider referral to Dr. Myna Hidalgo for peripheral smear.  Follow up in 7 days or as needed   Work note given to be off until Monday.  Pearse Shiffler, Ramon Dredge, PA-C

## 2016-09-26 NOTE — Telephone Encounter (Signed)
FYI.  On call nurse called with stat lab result.  CBC was ordered stat this pm.  White count improved.  Elevated monocytes.  Called pt and informed her of lab results.  Explained other lab results pending.

## 2016-09-26 NOTE — Telephone Encounter (Signed)
Patient was scheduled today for 30 minutes

## 2016-09-26 NOTE — Patient Instructions (Addendum)
For your recent severe fatigue, elevated wbc, and frequent urination will order cbc, hiv, cmp, epstein barr panel and urine culture.  Continue the augmentin pending culture results. Will see if strep still present.  Mammogram order placed today.  Will follow your results of above. If all study negative and wbc still elevated would consider referral to Dr. Myna HidalgoEnnever for peripheral smear.  Follow up in 7 days or as needed   Work note given to be off until Monday.

## 2016-09-26 NOTE — Telephone Encounter (Addendum)
Per AVS, pt is to follow up with Ramon DredgeEdward within 3 days of ER visit. Please call patient and schedule a 30 min visit with Ramon DredgeEdward on any day other than Thursday per provider.

## 2016-09-27 ENCOUNTER — Telehealth: Payer: Self-pay | Admitting: Medical

## 2016-09-27 LAB — COMPREHENSIVE METABOLIC PANEL
ALT: 23 U/L (ref 0–35)
AST: 22 U/L (ref 0–37)
Albumin: 4.2 g/dL (ref 3.5–5.2)
Alkaline Phosphatase: 63 U/L (ref 39–117)
BILIRUBIN TOTAL: 0.7 mg/dL (ref 0.2–1.2)
BUN: 10 mg/dL (ref 6–23)
CHLORIDE: 101 meq/L (ref 96–112)
CO2: 28 meq/L (ref 19–32)
Calcium: 9.3 mg/dL (ref 8.4–10.5)
Creatinine, Ser: 0.7 mg/dL (ref 0.40–1.20)
GFR: 94.55 mL/min (ref >60.00)
GLUCOSE: 83 mg/dL (ref 70–99)
Potassium: 3.9 mEq/L (ref 3.5–5.1)
Sodium: 137 mEq/L (ref 135–145)
Total Protein: 7 g/dL (ref 6.0–8.3)

## 2016-09-27 LAB — HIV ANTIBODY (ROUTINE TESTING W REFLEX): HIV 1&2 Ab, 4th Generation: NONREACTIVE

## 2016-09-27 MED FILL — PHENAZOPYRIDINE 200 MG TAB: 200 | 2 days supply | Qty: 6 | Fill #0

## 2016-09-27 MED FILL — ONDANSETRON ODT 4 MG TABLET: 4 | 3 days supply | Qty: 10 | Fill #0

## 2016-09-27 NOTE — Telephone Encounter (Addendum)
Caller name:Kileen Guhl Relationship to patient: Can be reached:[phone] Pharmacy:  Reason for call:would like letter stating that she can go back to work today, patient states she is feeling better. Only missed work on 09/25/16.

## 2016-09-27 NOTE — Telephone Encounter (Signed)
Informed pt letter ready at front desk

## 2016-09-28 ENCOUNTER — Telehealth: Payer: Self-pay | Admitting: *Deleted

## 2016-09-28 LAB — URINE CULTURE: ORGANISM ID, BACTERIA: NO GROWTH

## 2016-09-28 LAB — EPSTEIN-BARR VIRUS VCA ANTIBODY PANEL
EBV NA IgG: 33.2 U/mL — ABNORMAL HIGH
EBV VCA IgG: >750 U/mL — ABNORMAL HIGH
EBV VCA IgM: <36 U/mL

## 2016-09-28 NOTE — Telephone Encounter (Signed)
Received FMLA from [employer] to cover continuous leave; written from Sun, 09/23/16 [first UC visit] through Sun, 09/30/16 [work note written at 09/26/16 OV to RTW on Monday, 10/01/16]; completed as much as possible, forwarded to provider/SLS 06/29

## 2016-10-04 ENCOUNTER — Ambulatory Visit (HOSPITAL_BASED_OUTPATIENT_CLINIC_OR_DEPARTMENT_OTHER)
Admission: RE | Admit: 2016-10-04 | Discharge: 2016-10-04 | Disposition: A | Discharge status: Home / Self Care | Payer: 59 | Source: Ambulatory Visit | Attending: Medical | Admitting: Medical

## 2016-10-04 ENCOUNTER — Encounter: Payer: Self-pay | Admitting: Medical

## 2016-10-04 ENCOUNTER — Ambulatory Visit: Payer: Self-pay | Admitting: Medical

## 2016-10-04 ENCOUNTER — Ambulatory Visit (INDEPENDENT_AMBULATORY_CARE_PROVIDER_SITE_OTHER): Payer: 59 | Admitting: Medical

## 2016-10-04 VITALS — BP 125/63 | HR 82 | Temp 98.2°F | Resp 16 | Ht 66.0 in | Wt 163.8 lb

## 2016-10-04 DIAGNOSIS — R05 Cough: Secondary | ICD-10-CM

## 2016-10-04 DIAGNOSIS — R509 Fever, unspecified: Secondary | ICD-10-CM | POA: Diagnosis not present

## 2016-10-04 DIAGNOSIS — R590 Localized enlarged lymph nodes: Secondary | ICD-10-CM | POA: Diagnosis present

## 2016-10-04 DIAGNOSIS — Z8742 Personal history of other diseases of the female genital tract: Secondary | ICD-10-CM

## 2016-10-04 DIAGNOSIS — Z87440 Personal history of urinary (tract) infections: Secondary | ICD-10-CM | POA: Diagnosis not present

## 2016-10-04 DIAGNOSIS — R059 Cough, unspecified: Secondary | ICD-10-CM

## 2016-10-04 DIAGNOSIS — R5383 Other fatigue: Secondary | ICD-10-CM

## 2016-10-04 DIAGNOSIS — W57XXXA Bitten or stung by nonvenomous insect and other nonvenomous arthropods, initial encounter: Secondary | ICD-10-CM

## 2016-10-04 DIAGNOSIS — J984 Other disorders of lung: Secondary | ICD-10-CM | POA: Diagnosis not present

## 2016-10-04 DIAGNOSIS — R739 Hyperglycemia, unspecified: Secondary | ICD-10-CM

## 2016-10-04 DIAGNOSIS — R51 Headache: Secondary | ICD-10-CM

## 2016-10-04 DIAGNOSIS — Z87898 Personal history of other specified conditions: Secondary | ICD-10-CM

## 2016-10-04 DIAGNOSIS — R519 Headache, unspecified: Secondary | ICD-10-CM

## 2016-10-04 LAB — IRON AND TIBC
%SAT: 15 % (ref 11–50)
IRON: 51 ug/dL (ref 40–190)
TIBC: 334 ug/dL (ref 250–450)
UIBC: 283 ug/dL

## 2016-10-04 LAB — POC URINALSYSI DIPSTICK (AUTOMATED)
Bilirubin, UA: NEGATIVE
Glucose, UA: NEGATIVE
KETONES UA: NEGATIVE
Leukocytes, UA: NEGATIVE
Nitrite, UA: NEGATIVE
PH UA: 6.5 (ref 5.0–8.0)
PROTEIN UA: NEGATIVE
RBC UA: NEGATIVE
SPEC GRAV UA: 1.015 (ref 1.010–1.025)
UROBILINOGEN UA: NEGATIVE U/dL — AB

## 2016-10-04 MED ORDER — KETOROLAC TROMETHAMINE 30 MG/ML IJ SOLN
30.0000 mg | Freq: Once | INTRAMUSCULAR | Status: AC
  Administered 2016-10-04: 30 mg via INTRAMUSCULAR

## 2016-10-04 MED ORDER — PROMETHAZINE HCL 12.5 MG PO TABS: 12.5000 mg | ORAL_TABLET | Freq: Three times a day (TID) | ORAL | 0 refills | Status: DC | PRN

## 2016-10-04 MED FILL — SUMATRIPTAN SUCC 50 MG TAB: 50 | 30 days supply | Qty: 15 | Fill #0

## 2016-10-04 NOTE — Progress Notes (Signed)
Subjective:    Patient ID: Joanna Anderson, female    DOB: 03/24/1966, 51 y.o.   MRN: [id number]  HPI  Pt in states she worked on Friday. She wanted to go back to work. She states she still felt tired. Pt states on Monday she finished augmentin this past Monday. Antibiotic was for likely uti by review of ED notes and review of lab results.. She had few bacteria on culture. Pt felt weak yesterday and still feels week today. Temp was 99.4 today at work before she left ot bome here. On repeat cbc 8 days ago wbc was 10  but 10 days ago was 21.4.  Pt ebv virus was negative for acute infection. IgG was positive.  Hiv was negative.  Pt had negative ct scan.  LMP- October 01, 2016. Cycle are always regular and monthly.  Pt has no known tick bites. 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 But month ago a lot of her dogs had ticks.   I ordered mammogram on last  2 years ago last pap. She states was abnormal. She is not sure if she had full work up for the abnormal pap.  Hx of chronic uti in the past. She needed urologist work up in past for recurrent infections when younger.  Pt had negative colonoscopy in past. In past had colitis.(3 years ago). Pt had some loose stools recently about 3-4 days. None since.  HIV test was negative.  3-4 times over past week felt mild hr increase 90-95. Most recent ekg was 09-25-2016. She thinks mild hr increase when maybe feverish.       Review of Systems  Constitutional: Positive for fatigue and fever. Negative for chills and unexpected weight change.  Respiratory: Positive for cough. Negative for chest tightness, shortness of breath and wheezing.        Rare cough not productive.  Musculoskeletal: Negative for back pain.  Skin:       Mild rash left lower quadrant over a month ago. No tick bite seen.  Neurological: Negative for dizziness, seizures, syncope, weakness and headaches.  Hematological: Negative for adenopathy. Does not bruise/bleed easily.    Psychiatric/Behavioral: Negative for behavioral problems and suicidal ideas.       Hx of fibromyalgia   Past Medical History:  Diagnosis Date  . Anemia   . Anxiety   . ASD (atrial septal defect) 06/11/2014   S/p patch repair  . Atherosclerosis of abdominal aorta (HCC) 06/11/2014   Age advanced atherosclerosis of the infrarenal aorta  . Depression   . Dysmenorrhea   . Dysuria   . Encounter for long-term (current) use of other medications   . Headache   . Hematuria   . High cholesterol   . Left knee pain   . Migraine   . Other malaise and fatigue   . Pure hyperglyceridemia   . Routine general medical examination at a health care facility   . Shoulder pain   . Vitamin D deficiency   . Vitamin D deficiency   . VSD (ventricular septal defect), multiple 06/11/2014   S/p patch repair     Social History   Social History  . Marital status: Married    Spouse name: N/A  . Number of children: 4  . Years of education: College   Occupational History  . RN     [employer]   Social History Main Topics  . Smoking status: Never Smoker  . Smokeless tobacco: Never Used  . Alcohol use No  .  Drug use: No  . Sexual activity: Not on file   Other Topics Concern  . Not on file   Social History Narrative   Lives at home with husband and children.   Right-handed.    Past Surgical History:  Procedure Laterality Date  . c seaction    . CESAREAN SECTION    . KNEE ARTHROSCOPY W/ ACL RECONSTRUCTION AND HAMSTRING GRAFT Left   . vetricular hole      Family History  Problem Relation Age of Onset  . Depression Mother   . High blood pressure Mother   . Hyperlipidemia Mother   . Anxiety disorder Mother   . Hypertension Mother   . High blood pressure Father   . Hyperlipidemia Father   . Hypertension Father   . Crohn's disease Father   . Kidney disease Father   . Peripheral vascular disease Father   . Peripheral vascular disease Paternal Grandfather     Allergies  Allergen Reactions   . Banana Other (See Comments)    Headaches  . Nsaids Nausea Only and Other (See Comments)    Also caused stomach ulcers (can only tolerate in limited doses)  . Other Other (See Comments)    SSRI(s): Makes the patient feel "disconnected" Tricyclic antidepressants- Muscle spasms Sulfates- Itching  . Sulfa Antibiotics Hives    Current Outpatient Prescriptions on File Prior to Visit  Medication Sig Dispense Refill  . acetaminophen (TYLENOL) 650 MG CR tablet Take 650 mg by mouth every 8 (eight) hours as needed for pain. Reported on 07/01/2015    . amoxicillin-clavulanate (AUGMENTIN) 875-125 MG tablet Take 1 tablet by mouth every 12 (twelve) hours. 14 tablet 0  . famciclovir (FAMVIR) 500 MG tablet Take 1 tablet (500 mg total) by mouth 3 (three) times daily. 21 tablet 0  . fluticasone (FLONASE) 50 MCG/ACT nasal spray Place 2 sprays into both nostrils daily. 16 g 1  . ondansetron (ZOFRAN ODT) 4 MG disintegrating tablet Take 1 tablet (4 mg total) by mouth every 8 (eight) hours as needed for nausea or vomiting. 10 tablet 0  . pantoprazole (PROTONIX) 40 MG tablet Take 1 tablet (40 mg total) by mouth as needed. (Patient taking differently: Take 40 mg by mouth daily as needed (indigestion). ) 90 tablet 1  . phenazopyridine (PYRIDIUM) 200 MG tablet Take 1 tablet (200 mg total) by mouth 3 (three) times daily as needed for pain. 6 tablet 0  . polyethylene glycol (MIRALAX / GLYCOLAX) packet Take 17 g by mouth daily. 14 each 0  . promethazine (PHENERGAN) 25 MG tablet Take 1 tablet (25 mg total) by mouth every 8 (eight) hours as needed for nausea or vomiting. 20 tablet 0  . rosuvastatin (CRESTOR) 5 MG tablet Take 1 tablet (5 mg total) by mouth daily. 30 tablet 2  . SUMAtriptan (IMITREX) 50 MG tablet One tab at onset of migraine - may repeat in 2 hrs if needed (max dose: 2 tabs/24 hrs or 15/month).  Please call [phone] to schedule appt for continued refills. 15 tablet 1  . traMADol (ULTRAM) 50 MG tablet Take 1  tablet (50 mg total) by mouth every 8 (eight) hours as needed for severe pain. 90 tablet 0   No current facility-administered medications on file prior to visit.     BP 125/63 (BP Location: Right Arm, Patient Position: Sitting, Cuff Size: Normal)   Pulse 82   Temp 98.2 F (36.8 C) (Oral)   Resp 16   Ht 5\' 6"  (1.676 m)  Wt 163 lb 12.8 oz (74.3 kg)   SpO2 96%   BMI 26.44 kg/m       Objective:   Physical Exam   General  Mental Status - Alert. General Appearance - Well groomed. Not in acute distress.  Skin Rashes- No Rashes.  HEENT Head- Normal. Ear Auditory Canal - Left- Normal. Right - Normal.Tympanic Membrane- Left- Normal. Right- Normal. Eye Sclera/Conjunctiva- Left- Normal. Right- Normal. Nose & Sinuses Nasal Mucosa- Left-  Boggy and Congested. Right-  Boggy and  Congested.Bilateral maxillary and frontal sinus pressure. Mouth & Throat Lips: Upper Lip- Normal: no dryness, cracking, pallor, cyanosis, or vesicular eruption. Lower Lip-Normal: no dryness, cracking, pallor, cyanosis or vesicular eruption. Buccal Mucosa- Bilateral- No Aphthous ulcers. Oropharynx- No Discharge or Erythema. Tonsils: Characteristics- Bilateral- No Erythema or Congestion. Size/Enlargement- Bilateral- No enlargement. Discharge- bilateral-None.  Neck Neck- Supple. No Masses.   Chest and Lung Exam Auscultation: Breath Sounds:-Clear even and unlabored.  Cardiovascular Auscultation:Rythm- Regular, rate and rhythm. Murmurs & Other Heart Sounds:Ausculatation of the heart reveal- No Murmurs.  Lymphatic Head & Neck General Head & Neck Lymphatics: Bilateral: Description- No Localized lymphadenopathy.   Abdomen Inspection:-Inspection Normal.  Palpation/Perucssion: Palpation and Percussion of the abdomen reveal- Non Tender but full bladder at time palpated., No Rebound tenderness, No rigidity(Guarding) and No Palpable abdominal masses.  Liver:-Normal.  Spleen:- Normal.      Neurologic Cranial Nerve exam:- CN III-XII intact(No nystagmus), symmetric smile. Strength:- 5/5 equal and symmetric strength both upper and lower extremities.  Back- no cva pain.       Assessment & Plan:  For recent fatigue, fever, cough, possible tick bite, elevated sugar, hx of uti, and abnormal pap will get various labs as listed on the summary sheet.  Will also refer you to gyn to get repeat pap. Please stop by radiology to schedule mammogram.  If all work up negative and fever persists or fatigue persists will refer to Dr Myna HidalgoEnnever for evaluation and possible peripheral smear.  If stool test positive for blood refer to GI.  Follow up in 10 days or as needed  Try to hydrate and relax while on vacation. Will try to update you on lab when results are in.  Also head noted with menses. Normal neurologic exam. Toradol 60 mg im and rx phenergan  40 minutes spent with pt. 50% of time counseling on potential cause of FUO and fatigue(not clear presently uti was source of wbc elevation and fever). Work up  that would require and then discussed her HA and treatment for that as well.

## 2016-10-04 NOTE — Patient Instructions (Addendum)
For recent fatigue, fever, cough, possible tick bite, elevated sugar, hx of uti, and abnormal pap will get various labs as listed on the summary sheet.  Will also refer you to gyn to get repeat pap. Please stop by radiology to schedule mammogram.  If all work up negative and fever persists or fatigue persists will refer to Dr Myna HidalgoEnnever for evaluation and possible peripheral smear.  If stool test positive for blood refer to GI.  Follow up in 10 days or as needed  Try to hydrate and relax while on vacation. Will try to update you on lab when results are in.  Also head noted with menses. Normal neurologic exam. Toradol 60 mg im and rx phenergan

## 2016-10-05 LAB — CBC WITH DIFFERENTIAL/PLATELET
BASOS PCT: 0.6 % (ref 0.0–3.0)
Basophils Absolute: 0.1 10*3/uL (ref 0.0–0.1)
EOS ABS: 0.1 10*3/uL (ref 0.0–0.7)
EOS PCT: 0.7 % (ref 0.0–5.0)
HCT: 39.6 % (ref 36.0–46.0)
Hemoglobin: 13.3 g/dL (ref 12.0–15.0)
Lymphocytes Relative: 25.9 % (ref 12.0–46.0)
Lymphs Abs: 2.6 10*3/uL (ref 0.7–4.0)
MCHC: 33.6 g/dL (ref 30.0–36.0)
MCV: 89.2 fl (ref 78.0–100.0)
MONO ABS: 0.7 10*3/uL (ref 0.1–1.0)
Monocytes Relative: 6.9 % (ref 3.0–12.0)
NEUTROS ABS: 6.7 10*3/uL (ref 1.4–7.7)
Neutrophils Relative %: 65.9 % (ref 43.0–77.0)
PLATELETS: 244 10*3/uL (ref 150.0–400.0)
RBC: 4.44 Mil/uL (ref 3.87–5.11)
RDW: 13.2 % (ref 11.5–15.5)
WBC: 10.1 10*3/uL (ref 4.0–10.5)

## 2016-10-05 LAB — ROCKY MTN SPOTTED FVR ABS PNL(IGG+IGM)
RMSF IGG: NOT DETECTED
RMSF IGM: NOT DETECTED

## 2016-10-05 LAB — TSH: TSH: 0.98 u[IU]/mL (ref 0.35–4.50)

## 2016-10-05 LAB — HEMOGLOBIN A1C: HEMOGLOBIN A1C: 5.4 % (ref 4.6–6.5)

## 2016-10-05 LAB — VITAMIN D 25 HYDROXY (VIT D DEFICIENCY, FRACTURES): VITD: 39.19 ng/mL (ref 30.00–100.00)

## 2016-10-05 LAB — VITAMIN B12: Vitamin B-12: 276 pg/mL (ref 211–911)

## 2016-10-05 LAB — FERRITIN: Ferritin: 37 ng/mL (ref 10.0–291.0)

## 2016-10-05 LAB — T4, FREE: FREE T4: 0.94 ng/dL (ref 0.60–1.60)

## 2016-10-05 LAB — LYME AB/WESTERN BLOT REFLEX: B burgdorferi Ab IgG+IgM: <0.9 Index (ref <0.90)

## 2016-10-05 LAB — URINE CULTURE: ORGANISM ID, BACTERIA: NO GROWTH

## 2016-10-13 LAB — VITAMIN B1: VITAMIN B1 (THIAMINE): 12 nmol/L (ref 8–30)

## 2016-10-16 ENCOUNTER — Ambulatory Visit (HOSPITAL_BASED_OUTPATIENT_CLINIC_OR_DEPARTMENT_OTHER)
Admission: RE | Admit: 2016-10-16 | Discharge: 2016-10-16 | Disposition: A | Discharge status: Home / Self Care | Payer: 59 | Source: Ambulatory Visit | Attending: Medical | Admitting: Medical

## 2016-10-16 ENCOUNTER — Encounter (HOSPITAL_BASED_OUTPATIENT_CLINIC_OR_DEPARTMENT_OTHER): Payer: Self-pay

## 2016-10-16 DIAGNOSIS — Z1231 Encounter for screening mammogram for malignant neoplasm of breast: Secondary | ICD-10-CM | POA: Insufficient documentation

## 2016-10-16 DIAGNOSIS — Z1239 Encounter for other screening for malignant neoplasm of breast: Secondary | ICD-10-CM

## 2016-11-14 DIAGNOSIS — R102 Pelvic and perineal pain: Secondary | ICD-10-CM | POA: Diagnosis not present

## 2016-11-14 DIAGNOSIS — Z1151 Encounter for screening for human papillomavirus (HPV): Secondary | ICD-10-CM | POA: Diagnosis not present

## 2016-11-14 DIAGNOSIS — Z01419 Encounter for gynecological examination (general) (routine) without abnormal findings: Secondary | ICD-10-CM | POA: Diagnosis not present

## 2016-11-14 DIAGNOSIS — Z6826 Body mass index (BMI) 26.0-26.9, adult: Secondary | ICD-10-CM | POA: Diagnosis not present

## 2016-11-16 ENCOUNTER — Telehealth (INDEPENDENT_AMBULATORY_CARE_PROVIDER_SITE_OTHER): Payer: 59 | Admitting: Medical

## 2016-11-16 ENCOUNTER — Ambulatory Visit (INDEPENDENT_AMBULATORY_CARE_PROVIDER_SITE_OTHER): Payer: 59 | Admitting: Medical

## 2016-11-16 VITALS — BP 100/68 | HR 72 | Temp 98.4°F | Resp 18 | Wt 163.6 lb

## 2016-11-16 DIAGNOSIS — R5383 Other fatigue: Secondary | ICD-10-CM

## 2016-11-16 DIAGNOSIS — M549 Dorsalgia, unspecified: Secondary | ICD-10-CM | POA: Diagnosis not present

## 2016-11-16 DIAGNOSIS — R509 Fever, unspecified: Secondary | ICD-10-CM | POA: Diagnosis not present

## 2016-11-16 DIAGNOSIS — R300 Dysuria: Secondary | ICD-10-CM | POA: Diagnosis not present

## 2016-11-16 DIAGNOSIS — R6883 Chills (without fever): Secondary | ICD-10-CM

## 2016-11-16 LAB — POC URINALSYSI DIPSTICK (AUTOMATED)
Bilirubin, UA: NEGATIVE
Glucose, UA: NEGATIVE
Ketones, UA: NEGATIVE
LEUKOCYTES UA: NEGATIVE
NITRITE UA: NEGATIVE
PH UA: 6.5 (ref 5.0–8.0)
PROTEIN UA: NEGATIVE
RBC UA: NEGATIVE
Spec Grav, UA: 1.02 (ref 1.010–1.025)
UROBILINOGEN UA: 0.2 U/dL

## 2016-11-16 MED ORDER — TRAMADOL HCL 50 MG PO TABS: 50.0000 mg | ORAL_TABLET | Freq: Three times a day (TID) | ORAL | 0 refills | Status: DC | PRN

## 2016-11-16 MED ORDER — FLUTICASONE PROPIONATE 50 MCG/ACT NA SUSP: 2.0000 | Freq: Every day | NASAL | 1 refills | Status: DC

## 2016-11-16 MED FILL — traMADol HCL 50 MG TABS: 50 | 30 days supply | Qty: 90 | Fill #0

## 2016-11-16 MED FILL — FLUTICASONE PROP 50 MCG SPR: 50 | 60 days supply | Qty: 16 | Fill #0

## 2016-11-16 NOTE — Addendum Note (Signed)
Addended by: Crissie Sickles A on: 11/16/2016 03:55 PM   Modules accepted: Orders

## 2016-11-16 NOTE — Telephone Encounter (Signed)
ua placed

## 2016-11-16 NOTE — Addendum Note (Signed)
Addended by: Harley Alto on: 11/16/2016 04:38 PM   Modules accepted: Orders

## 2016-11-16 NOTE — Patient Instructions (Signed)
For your fatigue, fever, and chills I want you to go ahead and contact [facility] to see when they recommend you repeat colonoscopy.  So far your fever origin has not been determined despite work up. So will refer to hematologist as you mentioned you dad revealed blood disorder that could be inherited  Let us know your pap results when they come in.  Will get ua and culture studies today. Will treat if culture positve or if pending results your symptoms worsen.  Follow up in 3 weeks or as needed

## 2016-11-16 NOTE — Progress Notes (Signed)
Subjective:    Patient ID: Joanna Anderson, female    DOB: [birth year], 50 y.o.   MRN: [id number]  HPI  Pt in for evaluation.   She is still having some body aches, sweats, and some fatigue, and fever in early summer. I have done work up in past and ED did work up for some of her symptoms.  Pt states she had mammogram, Papsmear, and stool card negative are still pending.  Mono studies done and showed no acute infection. But past infection. Also hiv test was negative. Tick bite studies were negative.,  Pt weight has been stable.  Pt states her dad may have had a blood disorder.(red blood cell disorder and platelets) Pt never new what his disorder was. He told her something you need to know which is heriditary.  Pt had colonoscopy about 3 years ago. Told to come back if any problems?  Iron levels were normal.  Ct of abdomen negative, and cxr negative. Pt is mentsruating regularly. LMP- November 03, 2016.(no perimenopause patter).  Pt states she has been exercising regularly.   Some off on and slight burning on urination since last Friday. Faint lt cva pain in am   Review of Systems  Constitutional: Positive for chills, fatigue and fever.  HENT: Negative for congestion, ear discharge, ear pain, postnasal drip, rhinorrhea, sinus pain and sinus pressure.   Respiratory: Negative for cough, choking, shortness of breath and wheezing.   Cardiovascular: Negative for chest pain and palpitations.  Gastrointestinal: Negative for abdominal pain, blood in stool, constipation, diarrhea, nausea and vomiting.  Genitourinary: Positive for dysuria. Negative for decreased urine volume, frequency, hematuria and menstrual problem.       See hpi.  No current urinary symptoms today but has had on and off.  Musculoskeletal: Negative for back pain, gait problem, neck pain and neck stiffness.       This am faint cva pain  Skin: Negative for rash.  Neurological: Negative for dizziness, speech  difficulty, weakness, numbness and headaches.  Hematological: Negative for adenopathy. Does not bruise/bleed easily.  Psychiatric/Behavioral: Negative for behavioral problems and confusion.    Past Medical History:  Diagnosis Date  . Anemia   . Anxiety   . ASD (atrial septal defect) 06/11/2014   S/p patch repair  . Atherosclerosis of abdominal aorta (HCC) 06/11/2014   Age advanced atherosclerosis of the infrarenal aorta  . Depression   . Dysmenorrhea   . Dysuria   . Encounter for long-term (current) use of other medications   . Headache   . Hematuria   . High cholesterol   . Left knee pain   . Migraine   . Other malaise and fatigue   . Pure hyperglyceridemia   . Routine general medical examination at a health care facility   . Shoulder pain   . Vitamin D deficiency   . Vitamin D deficiency   . VSD (ventricular septal defect), multiple 06/11/2014   S/p patch repair     Social History   Social History  . Marital status: Married    Spouse name: N/A  . Number of children: 4  . Years of education: College   Occupational History  . RN     [employer]   Social History Main Topics  . Smoking status: Never Smoker  . Smokeless tobacco: Never Used  . Alcohol use No  . Drug use: No  . Sexual activity: Not on file   Other Topics Concern  . Not on file  Social History Narrative   Lives at home with husband and children.   Right-handed.    Past Surgical History:  Procedure Laterality Date  . c seaction    . CESAREAN SECTION    . KNEE ARTHROSCOPY W/ ACL RECONSTRUCTION AND HAMSTRING GRAFT Left   . vetricular hole      Family History  Problem Relation Age of Onset  . Depression Mother   . High blood pressure Mother   . Hyperlipidemia Mother   . Anxiety disorder Mother   . Hypertension Mother   . High blood pressure Father   . Hyperlipidemia Father   . Hypertension Father   . Crohn's disease Father   . Kidney disease Father   . Peripheral vascular disease Father     . Peripheral vascular disease Paternal Grandfather     Allergies  Allergen Reactions  . Banana Other (See Comments)    Headaches  . Nsaids Nausea Only and Other (See Comments)    Also caused stomach ulcers (can only tolerate in limited doses)  . Other Other (See Comments)    SSRI(s): Makes the patient feel "disconnected" Tricyclic antidepressants- Muscle spasms Sulfates- Itching  . Sulfa Antibiotics Hives    Current Outpatient Prescriptions on File Prior to Visit  Medication Sig Dispense Refill  . acetaminophen (TYLENOL) 650 MG CR tablet Take 650 mg by mouth every 8 (eight) hours as needed for pain. Reported on 07/01/2015    . famciclovir (FAMVIR) 500 MG tablet Take 1 tablet (500 mg total) by mouth 3 (three) times daily. 21 tablet 0  . fluticasone (FLONASE) 50 MCG/ACT nasal spray Place 2 sprays into both nostrils daily. 16 g 1  . ondansetron (ZOFRAN ODT) 4 MG disintegrating tablet Take 1 tablet (4 mg total) by mouth every 8 (eight) hours as needed for nausea or vomiting. 10 tablet 0  . pantoprazole (PROTONIX) 40 MG tablet Take 1 tablet (40 mg total) by mouth as needed. (Patient taking differently: Take 40 mg by mouth daily as needed (indigestion). ) 90 tablet 1  . phenazopyridine (PYRIDIUM) 200 MG tablet Take 1 tablet (200 mg total) by mouth 3 (three) times daily as needed for pain. 6 tablet 0  . polyethylene glycol (MIRALAX / GLYCOLAX) packet Take 17 g by mouth daily. 14 each 0  . promethazine (PHENERGAN) 12.5 MG tablet Take 1 tablet (12.5 mg total) by mouth every 8 (eight) hours as needed for nausea or vomiting. 6 tablet 0  . rosuvastatin (CRESTOR) 5 MG tablet Take 1 tablet (5 mg total) by mouth daily. 30 tablet 2  . SUMAtriptan (IMITREX) 50 MG tablet One tab at onset of migraine - may repeat in 2 hrs if needed (max dose: 2 tabs/24 hrs or 15/month).  Please call [phone] to schedule appt for continued refills. 15 tablet 1  . traMADol (ULTRAM) 50 MG tablet Take 1 tablet (50 mg total) by  mouth every 8 (eight) hours as needed for severe pain. 90 tablet 0   No current facility-administered medications on file prior to visit.     BP 100/68 (BP Location: Left Arm, Patient Position: Sitting, Cuff Size: Normal)   Pulse 72   Temp 98.4 F (36.9 C) (Oral)   Resp 18   Wt 163 lb 9.6 oz (74.2 kg)   SpO2 96%   BMI 26.41 kg/m       Objective:   Physical Exam  General Appearance- Not in acute distress.  HEENT Eyes- Scleraeral/Conjuntiva-bilat- Not Yellow. Mouth & Throat- Normal.  Chest  and Lung Exam Auscultation: Breath sounds:-Normal. Adventitious sounds:- No Adventitious sounds.  Cardiovascular Auscultation:Rythm - Regular. Heart Sounds -Normal heart sounds.  Abdomen Inspection:-Inspection Normal.  Palpation/Perucssion: Palpation and Percussion of the abdomen reveal- faint suprapubic Tender but full bladder, No Rebound tenderness, No rigidity(Guarding) and No Palpable abdominal masses.  Liver:-Normal.  Spleen:- Normal.   Lymphatic exam- negative.  Back- faint left cva pain.      Assessment & Plan:  For your fatigue, fever, and chills I want you to go ahead and contact [facility] to see when they recommend you repeat colonoscopy.  So far your fever origin has not been determined despite work up. So will refer to hematologist as you mentioned you dad revealed blood disorder that could be inherited  Let us know your pap results when they come in.  Will get ua and culture studies today. Will treat if culture positve or if pending results your symptoms worsen.  Follow up in 3 weeks or as needed  Keara Pagliarulo, Ramon Dredge, [employer]

## 2016-11-17 LAB — URINE CULTURE: Organism ID, Bacteria: NO GROWTH

## 2016-11-22 ENCOUNTER — Other Ambulatory Visit (INDEPENDENT_AMBULATORY_CARE_PROVIDER_SITE_OTHER): Payer: 59

## 2016-11-22 DIAGNOSIS — R102 Pelvic and perineal pain: Secondary | ICD-10-CM

## 2016-11-22 LAB — FECAL OCCULT BLOOD, IMMUNOCHEMICAL: Fecal Occult Bld: NEGATIVE

## 2016-11-23 ENCOUNTER — Telehealth: Payer: Self-pay | Admitting: Medical

## 2016-11-23 ENCOUNTER — Encounter: Payer: Self-pay | Admitting: Medical

## 2016-11-23 NOTE — Telephone Encounter (Signed)
error:[id number] ° °

## 2016-11-23 NOTE — Telephone Encounter (Signed)
°  Relation to MB:EMLJ Call back [phone]   Reason for call:  Patient will contact [facility]: Shea Evans R MD and have them fax most recent pap to [phone] for PCP review, please note when received

## 2016-12-10 ENCOUNTER — Encounter: Payer: Self-pay | Admitting: Hematology and Oncology

## 2016-12-21 ENCOUNTER — Encounter: Payer: Self-pay | Admitting: Hematology and Oncology

## 2016-12-21 ENCOUNTER — Ambulatory Visit (HOSPITAL_BASED_OUTPATIENT_CLINIC_OR_DEPARTMENT_OTHER): Payer: 59 | Admitting: Hematology and Oncology

## 2016-12-21 ENCOUNTER — Telehealth: Payer: Self-pay | Admitting: Hematology and Oncology

## 2016-12-21 ENCOUNTER — Ambulatory Visit (HOSPITAL_BASED_OUTPATIENT_CLINIC_OR_DEPARTMENT_OTHER): Payer: 59

## 2016-12-21 VITALS — BP 130/68 | HR 75 | Temp 99.2°F | Resp 18 | Ht 66.0 in | Wt 161.0 lb

## 2016-12-21 DIAGNOSIS — R531 Weakness: Secondary | ICD-10-CM | POA: Diagnosis not present

## 2016-12-21 DIAGNOSIS — L299 Pruritus, unspecified: Secondary | ICD-10-CM | POA: Diagnosis not present

## 2016-12-21 DIAGNOSIS — R509 Fever, unspecified: Secondary | ICD-10-CM

## 2016-12-21 DIAGNOSIS — A689 Relapsing fever, unspecified: Secondary | ICD-10-CM | POA: Diagnosis not present

## 2016-12-21 DIAGNOSIS — R5383 Other fatigue: Secondary | ICD-10-CM

## 2016-12-21 DIAGNOSIS — M2550 Pain in unspecified joint: Secondary | ICD-10-CM | POA: Diagnosis not present

## 2016-12-21 DIAGNOSIS — R51 Headache: Secondary | ICD-10-CM | POA: Diagnosis not present

## 2016-12-21 DIAGNOSIS — M542 Cervicalgia: Secondary | ICD-10-CM

## 2016-12-21 DIAGNOSIS — R110 Nausea: Secondary | ICD-10-CM | POA: Diagnosis not present

## 2016-12-21 DIAGNOSIS — Z862 Personal history of diseases of the blood and blood-forming organs and certain disorders involving the immune mechanism: Secondary | ICD-10-CM | POA: Diagnosis not present

## 2016-12-21 DIAGNOSIS — M549 Dorsalgia, unspecified: Secondary | ICD-10-CM

## 2016-12-21 LAB — CBC & DIFF AND RETIC
BASO%: 0.3 % (ref 0.0–2.0)
Basophils Absolute: 0 10*3/uL (ref 0.0–0.1)
EOS ABS: 0.1 10*3/uL (ref 0.0–0.5)
EOS%: 1.8 % (ref 0.0–7.0)
HCT: 39.6 % (ref 34.8–46.6)
HEMOGLOBIN: 13.2 g/dL (ref 11.6–15.9)
Immature Retic Fract: 2.4 % (ref 1.60–10.00)
LYMPH%: 31.5 % (ref 14.0–49.7)
MCH: 29.9 pg (ref 25.1–34.0)
MCHC: 33.3 g/dL (ref 31.5–36.0)
MCV: 89.8 fL (ref 79.5–101.0)
MONO#: 0.6 10*3/uL (ref 0.1–0.9)
MONO%: 7.8 % (ref 0.0–14.0)
NEUT%: 58.6 % (ref 38.4–76.8)
NEUTROS ABS: 4.6 10*3/uL (ref 1.5–6.5)
Platelets: 179 10*3/uL (ref 145–400)
RBC: 4.41 10*6/uL (ref 3.70–5.45)
RDW: 12.3 % (ref 11.2–14.5)
RETIC %: 0.9 % (ref 0.70–2.10)
Retic Ct Abs: 39.69 10*3/uL (ref 33.70–90.70)
WBC: 7.8 10*3/uL (ref 3.9–10.3)
lymph#: 2.5 10*3/uL (ref 0.9–3.3)

## 2016-12-21 LAB — COMPREHENSIVE METABOLIC PANEL
ALK PHOS: 75 U/L (ref 40–150)
ALT: 13 U/L (ref 0–55)
ANION GAP: 9 meq/L (ref 3–11)
AST: 18 U/L (ref 5–34)
Albumin: 3.9 g/dL (ref 3.5–5.0)
BUN: 9.4 mg/dL (ref 7.0–26.0)
CALCIUM: 9.4 mg/dL (ref 8.4–10.4)
CO2: 25 mEq/L (ref 22–29)
CREATININE: 0.7 mg/dL (ref 0.6–1.1)
Chloride: 106 mEq/L (ref 98–109)
GLUCOSE: 90 mg/dL (ref 70–140)
Potassium: 3.9 mEq/L (ref 3.5–5.1)
Sodium: 140 mEq/L (ref 136–145)
Total Bilirubin: 0.65 mg/dL (ref 0.20–1.20)
Total Protein: 7.5 g/dL (ref 6.4–8.3)

## 2016-12-21 LAB — MORPHOLOGY: PLT EST: ADEQUATE

## 2016-12-21 LAB — FERRITIN: Ferritin: 46 ng/ml (ref 9–269)

## 2016-12-21 LAB — LACTATE DEHYDROGENASE: LDH: 158 U/L (ref 125–245)

## 2016-12-21 NOTE — Telephone Encounter (Signed)
Scheduled appt per 9/21 los - Gave patient AVS and calender per los.  

## 2016-12-22 LAB — RHEUMATOID FACTOR

## 2016-12-22 LAB — FOLATE

## 2016-12-22 LAB — VITAMIN B12: Vitamin B12: 424 pg/mL (ref 232–1245)

## 2016-12-22 LAB — MONO QUAL W/RFLX QN: Mono Qual W/Rflx Qn: NEGATIVE

## 2016-12-22 LAB — C-REACTIVE PROTEIN: CRP: 2.2 mg/L (ref 0.0–4.9)

## 2016-12-22 LAB — SEDIMENTATION RATE: SED RATE: 4 mm/h (ref 0–32)

## 2016-12-24 LAB — ANTINUCLEAR ANTIBODIES, IFA: ANA Titer 1: NEGATIVE

## 2016-12-24 LAB — HOMOCYSTEINE: Homocysteine: 5.3 umol/L (ref 0.0–15.0)

## 2016-12-25 ENCOUNTER — Telehealth: Payer: Self-pay | Admitting: Medical

## 2016-12-25 LAB — METHYLMALONIC ACID, SERUM: METHYLMALONIC ACID: 74 nmol/L (ref 0–378)

## 2016-12-25 NOTE — Telephone Encounter (Signed)
Pt called in to schedule an apt. Pt says that she just feels bad today. I offered to schedule pt with a different provider, pt declined. I tried transferring pt to [facility], pt declined. Pt says that she has been having neurological issues for 2 years. Pt says that her left hand and ankle is going numb. Pt says that she cant sleep.    Scheduled pt with provider per her request tomorrow morning.

## 2016-12-26 ENCOUNTER — Encounter: Payer: Self-pay | Admitting: Medical

## 2016-12-26 ENCOUNTER — Telehealth: Payer: Self-pay | Admitting: Medical

## 2016-12-26 ENCOUNTER — Ambulatory Visit (INDEPENDENT_AMBULATORY_CARE_PROVIDER_SITE_OTHER): Payer: 59 | Admitting: Medical

## 2016-12-26 VITALS — BP 133/46 | HR 85 | Temp 98.7°F | Resp 16 | Ht 66.0 in | Wt 163.0 lb

## 2016-12-26 DIAGNOSIS — A689 Relapsing fever, unspecified: Secondary | ICD-10-CM | POA: Insufficient documentation

## 2016-12-26 DIAGNOSIS — M797 Fibromyalgia: Secondary | ICD-10-CM | POA: Diagnosis not present

## 2016-12-26 DIAGNOSIS — R079 Chest pain, unspecified: Secondary | ICD-10-CM | POA: Diagnosis not present

## 2016-12-26 DIAGNOSIS — L299 Pruritus, unspecified: Secondary | ICD-10-CM | POA: Diagnosis not present

## 2016-12-26 DIAGNOSIS — R5383 Other fatigue: Secondary | ICD-10-CM

## 2016-12-26 LAB — AMMONIA: Ammonia: 47 umol/L — ABNORMAL HIGH (ref 11–35)

## 2016-12-26 LAB — TROPONIN I: TNIDX: 0 ug/l (ref 0.00–0.06)

## 2016-12-26 MED ORDER — TRAMADOL HCL 50 MG PO TABS: 50.0000 mg | ORAL_TABLET | Freq: Three times a day (TID) | ORAL | 0 refills | Status: DC | PRN

## 2016-12-26 MED ORDER — CLONAZEPAM 0.5 MG PO TABS: 0.5000 mg | ORAL_TABLET | Freq: Every day | ORAL | 0 refills | Status: DC

## 2016-12-26 MED FILL — traMADol HCL 50 MG TABS: 50 | 30 days supply | Qty: 90 | Fill #0

## 2016-12-26 NOTE — Progress Notes (Signed)
Subjective:    Patient ID: Joanna Anderson, female    DOB: October 05, 1965, 51 y.o.   MRN: [id number]  HPI  Pt had some chest pain that occurred all night last night. Pain was 3-4/10. Pressure sensation. Off and on this morning. Level 2/10 pain this am. Pain today when waiting for me in room but went away by time I got to her room.   States heart still feels racing presently and feels heavy. No shoulder or arm pain. Maybe some sweating last night and some maybe presently per pt.  I recommend going to ED for evaluation to get repeat ekg and troponin. Maybe 2 sets. Pt expresses that I can document she declined/ refused against medical judgement.(although extensively counseled her)    Subjective fever still as has been in past. Pt has seen hematologist already and will see again tomorrow. Pt b12 has been on low sides. Oncologist work up so far negative. No imaging studies ordered. On review about 10 pound weight loss since 2017 on review.  Pt expresses dilema of her her temp being 99.6.Her work often tells her not to come to work with fever.  Pt still has diffuse pain all over like her fibromyalgia despite neurontin and tramadol. She thinks tramadol is cause of slight itching. But she needs for pain on control. I did express trial stopping for few days but she states feels like needs to use for pain. No report short of breath, wheezing or lip swelling.     Review of Systems  Constitutional: Positive for diaphoresis and fatigue. Negative for chills and fever.       Still has subjective fevers.  HENT: Negative for congestion, dental problem, ear pain, hearing loss, mouth sores, nosebleeds, postnasal drip, rhinorrhea and sinus pain.   Respiratory: Negative for cough, chest tightness, shortness of breath and wheezing.   Cardiovascular: Negative for chest pain and palpitations.       Not exactly present right now but some when she was waiting.  Gastrointestinal: Negative for abdominal distention,  abdominal pain, blood in stool and constipation.  Genitourinary: Negative for decreased urine volume, dysuria, flank pain, pelvic pain and urgency.  Musculoskeletal: Negative for back pain, gait problem, neck pain and neck stiffness.       Still has diffuse muscle aches.  All over history of fibromyalgia.  Skin: Negative for rash.       Some itching to skin just recently.   Neurological: Negative for dizziness, speech difficulty, weakness, numbness and headaches.       See history of present illness  Hematological: Negative for adenopathy. Does not bruise/bleed easily.  Psychiatric/Behavioral: Negative for agitation, confusion, dysphoric mood, hallucinations and sleep disturbance. The patient is nervous/anxious.        Frustration regarding her various symptoms. Expresses frustration that cause identified particularly for low-grade fever.     Past Medical History:  Diagnosis Date  . Anemia   . Anxiety   . ASD (atrial septal defect) 06/11/2014   S/p patch repair  . Atherosclerosis of abdominal aorta (HCC) 06/11/2014   Age advanced atherosclerosis of the infrarenal aorta  . Depression   . Dysmenorrhea   . Dysuria   . Encounter for long-term (current) use of other medications   . Headache   . Hematuria   . High cholesterol   . Left knee pain   . Migraine   . Other malaise and fatigue   . Pure hyperglyceridemia   . Routine general medical examination at a  health care facility   . Shoulder pain   . Vitamin D deficiency   . Vitamin D deficiency   . VSD (ventricular septal defect), multiple 06/11/2014   S/p patch repair     Social History   Social History  . Marital status: Married    Spouse name: N/A  . Number of children: 4  . Years of education: College   Occupational History  . RN     [employer]   Social History Main Topics  . Smoking status: Never Smoker  . Smokeless tobacco: Never Used  . Alcohol use No  . Drug use: No  . Sexual activity: Not on file   Other Topics  Concern  . Not on file   Social History Narrative   Lives at home with husband and children.   Right-handed.    Past Surgical History:  Procedure Laterality Date  . c seaction    . CESAREAN SECTION    . KNEE ARTHROSCOPY W/ ACL RECONSTRUCTION AND HAMSTRING GRAFT Left   . vetricular hole      Family History  Problem Relation Age of Onset  . Depression Mother   . High blood pressure Mother   . Hyperlipidemia Mother   . Anxiety disorder Mother   . Hypertension Mother   . High blood pressure Father   . Hyperlipidemia Father   . Hypertension Father   . Crohn's disease Father   . Kidney disease Father   . Peripheral vascular disease Father   . Peripheral vascular disease Paternal Grandfather     Allergies  Allergen Reactions  . Banana Other (See Comments)    Headaches  . Nsaids Nausea Only and Other (See Comments)    Also caused stomach ulcers (can only tolerate in limited doses)  . Other Other (See Comments)    SSRI(s): Makes the patient feel "disconnected" Tricyclic antidepressants- Muscle spasms Sulfates- Itching  . Sulfa Antibiotics Hives    Current Outpatient Prescriptions on File Prior to Visit  Medication Sig Dispense Refill  . acetaminophen (TYLENOL) 650 MG CR tablet Take 650 mg by mouth every 8 (eight) hours as needed for pain. Reported on 07/01/2015    . famciclovir (FAMVIR) 500 MG tablet Take 1 tablet (500 mg total) by mouth 3 (three) times daily. 21 tablet 0  . fluticasone (FLONASE) 50 MCG/ACT nasal spray Place 2 sprays into both nostrils daily. 16 g 1  . gabapentin (NEURONTIN) 300 MG capsule Take 300 mg by mouth 4 (four) times daily.    . ondansetron (ZOFRAN ODT) 4 MG disintegrating tablet Take 1 tablet (4 mg total) by mouth every 8 (eight) hours as needed for nausea or vomiting. 10 tablet 0  . pantoprazole (PROTONIX) 40 MG tablet Take 1 tablet (40 mg total) by mouth as needed. (Patient taking differently: Take 40 mg by mouth daily as needed (indigestion). )  90 tablet 1  . phenazopyridine (PYRIDIUM) 200 MG tablet Take 1 tablet (200 mg total) by mouth 3 (three) times daily as needed for pain. 6 tablet 0  . polyethylene glycol (MIRALAX / GLYCOLAX) packet Take 17 g by mouth daily. 14 each 0  . promethazine (PHENERGAN) 12.5 MG tablet Take 1 tablet (12.5 mg total) by mouth every 8 (eight) hours as needed for nausea or vomiting. 6 tablet 0  . SUMAtriptan (IMITREX) 50 MG tablet One tab at onset of migraine - may repeat in 2 hrs if needed (max dose: 2 tabs/24 hrs or 15/month).  Please call [phone] to schedule appt  for continued refills. 15 tablet 1  . traMADol (ULTRAM) 50 MG tablet Take 1 tablet (50 mg total) by mouth every 8 (eight) hours as needed for severe pain. 90 tablet 0   No current facility-administered medications on file prior to visit.     BP (!) 133/46   Pulse 85   Temp 98.7 F (37.1 C) (Oral)   Resp 16   Ht  (1.676 m)   Wt 163 lb (73.9 kg)   SpO2 99%   BMI 26.31 kg/m       Objective:   Physical Exam  General Mental Status- Alert. General Appearance- Not in acute distress.   Skin General: Color- Normal Color. Moisture- Normal Moisture.  Neck Carotid Arteries- Normal color. Moisture- Normal Moisture. No carotid bruits. No JVD.  Chest and Lung Exam Auscultation: Breath Sounds:-Normal.  Cardiovascular Auscultation:Rythm- Regular. Murmurs & Other Heart Sounds:Auscultation of the heart reveals- No Murmurs.  Abdomen Inspection:-Inspeection Normal. Palpation/Percussion:Note:No mass. Palpation and Percussion of the abdomen reveal- Non Tender, Non Distended + BS, no rebound or guarding.   Neurologic Cranial Nerve exam:- CN III-XII intact(No nystagmus), symmetric smile. Strength:- 5/5 equal and symmetric strength both upper and lower extremities.        Assessment & Plan:  For your recent chest pain last night and some present earlier today/before I got in the room, I do think the safest approach would be  emergency department evaluation for repeat EKG and possible consecutive sets of troponin. She declined this and are aware of risk.  You did note that if your chest pain does return that you would go to the emergency department/call 911.  Reviewed pt ekg maybe horizontal depression st  lead I and II. Also artifact.(other ekg ma ran did not show artifact on lead I and II. Otherwise sinus rythm  I will do one set of troponin today and ask for to be stat. Has a stated troponins outpatient sometimes vary onhow fast I get the result.  Also I want to offer you possible cardiology referral in light of this recent described pain you had.  For your fibromyalgia pain, I would recommend continuing Neurontin. You can also continue tramadol. But if you do have high suspicion that it is causing itching or you have a rash with this then advise stopping tramadol.  For your itching today will get an ammonia level.  For your low-grade fever of undetermined origin  Please follow-up with oncologist tomorrow. On review of prior labs you might ask him to review the 06-2014 CT of chest that showed nodule. Also ask if he thinks maybe MRI of head to visualize pituitary area may be of benefit. Please see what oncologist thinks of this.  Follow-up in 10-14 days or as needed.  Time actually spent with patient today was in excess of 40 minutes. 50% of time was counseling on potential differential diagnosis for her low-grade fever of undetermined origin as well as discussion on fibromyalgia management, recent itching, and her frustration as well as some anxiety. Also advise/coached patient on questions to ask oncologist/hematologist tomorrow.  Modesta Sammons, Ramon Dredge, PA-C

## 2016-12-26 NOTE — Telephone Encounter (Signed)
I did try to call patient to notify regarding the troponin value of 0. Patient did not pick up the phone. But I did send the my chart message advising patient on the 0 value. Please see that my chart note.

## 2016-12-26 NOTE — Patient Instructions (Addendum)
For your recent chest pain last night and some present earlier today/before I got in the room, I do think the safest approach would be emergency department evaluation for repeat EKG and possible consecutive sets of troponin. You declined this and are aware of risk.  You did note that if your chest pain does return that you would go to the emergency department/call 911.  I will do one set of troponin today and ask for to be stat. Explained troponins outpatient sometimes vary on how fast I get the result.  Also I want to offer you possible cardiology referral in light of this recent described pain you had.  For your fibromyalgia pain, I would recommend continuing Neurontin. You can also continue tramadol. But if you do have high suspicion that it is causing itching or you have a rash with this then advise stopping tramadol.  For your itching today will get an ammonia level.  Continue b12 otc. See if specialist thinks b12 IM beneficial in her case.  For your low-grade fever of undetermined origin  Please follow-up with oncologist tomorrow. On review of prior labs you might ask him to review the 06-2014 CT of chest that showed nodule. Also ask if he thinks maybe MRI of head to visualize pituitary area may be of benefit. Please see what oncologist thinks of this.  For recent anxiety, stress and trouble sleeping. Rx clonzepam 7 tabs to use at night.(after discussion pt declined and I shredded)  Follow-up in 10-14 days or as needed.

## 2016-12-26 NOTE — Assessment & Plan Note (Signed)
51 y.o. female presenting to the clinic for evaluation of recurrent fevers with multitude of other symptoms including generalized fatigue, and weakness, possible night sweats, possible skin rashes.  Overall, patient is presently afebrile. The history is not affording itself easily to a single medical condition. Lymphoproliferative processes such as B-cell lymphoproliferative disorders or a T-cell lymphoma can cause the symptoms mentioned. Some other myeloproliferative processes could also contribute. Patient does not have a history of anaphylactic reactions such mast cell disorders or eosinophilia are less likely.  Inflammatory conditions such as lupus or rheumatoid arthritis are a possibility. Indolent infection such as TB, lime, and other tickborne illnesses should also be on the differential.  Plan: --Labs today as outlined below --RTC 1 week to review

## 2016-12-26 NOTE — Progress Notes (Signed)
Joanna Anderson New Visit:  Assessment: Recurrent fever 51 y.o. female presenting to the clinic for evaluation of recurrent fevers with multitude of other symptoms including generalized fatigue, and weakness, possible night sweats, possible skin rashes.  Overall, patient is presently afebrile. The history is not affording itself easily to a single medical condition. Lymphoproliferative processes such as B-cell lymphoproliferative disorders or a T-cell lymphoma can cause the symptoms mentioned. Some other myeloproliferative processes could also contribute. Patient does not have a history of anaphylactic reactions such mast cell disorders or eosinophilia are less likely.  Inflammatory conditions such as lupus or rheumatoid arthritis are a possibility. Indolent infection such as TB, lime, and other tickborne illnesses should also be on the differential.  Plan: --Labs today as outlined below --RTC 1 week to review  Voice recognition software was used and creation of this note. Despite my best effort at editing the text, some misspelling/errors may have occurred.  Orders Placed This Encounter  Procedures  . CBC & Diff and Retic    Standing Status:   Future    Number of Occurrences:   1    Standing Expiration Date:   12/21/2017  . Morphology    Standing Status:   Future    Number of Occurrences:   1    Standing Expiration Date:   12/21/2017  . Sedimentation rate    Standing Status:   Future    Number of Occurrences:   1    Standing Expiration Date:   12/21/2017  . Comprehensive metabolic panel    Standing Status:   Future    Number of Occurrences:   1    Standing Expiration Date:   12/21/2017  . Lactate dehydrogenase (LDH)    Standing Status:   Future    Number of Occurrences:   1    Standing Expiration Date:   12/21/2017  . C-reactive protein    Standing Status:   Future    Number of Occurrences:   1    Standing Expiration Date:   12/21/2017  . Ferritin    Standing  Status:   Future    Number of Occurrences:   1    Standing Expiration Date:   12/21/2017  . Vitamin B12    Standing Status:   Future    Number of Occurrences:   1    Standing Expiration Date:   12/21/2017  . Methylmalonic acid, serum    Standing Status:   Future    Number of Occurrences:   1    Standing Expiration Date:   12/21/2017  . Folate, Serum    Standing Status:   Future    Number of Occurrences:   1    Standing Expiration Date:   12/21/2017  . Homocysteine, serum    Standing Status:   Future    Number of Occurrences:   1    Standing Expiration Date:   12/21/2017  . ANA, IFA (with reflex)    Standing Status:   Future    Number of Occurrences:   1    Standing Expiration Date:   12/21/2017  . Rheumatoid factor    Standing Status:   Future    Number of Occurrences:   1    Standing Expiration Date:   12/21/2017  . Mononucleosis Test, Qual W/ Reflex    Standing Status:   Future    Number of Occurrences:   1    Standing Expiration Date:   12/21/2017  All questions were answered.  . The patient knows to call the clinic with any problems, questions or concerns.  This note was electronically signed.    History of Presenting Illness Joanna Anderson 51 y.o. presenting to the [facility] for evaluation of recurrent fevers. At the present time, patient reports recurrent fevers as high as 100.5 in July with generalized weakness, fatigue, and nausea. Nausea has improved from the summer, but still intermittently occurs. Patient reports intermittent sweats, including drenching night sweats. These have resolved, and patient only has occasional significant sweating with time. It appears that patient has been doing well until about 3 years ago which time, she has developed a 9 month period of anemia continuous headaches which progressed to multitude of new symptoms and significantly altered the patient's activity levels. Prior to that, patient has been active outdoor runner, but has not been  engaged in that since. She denies any previous tick bites. About 2 years ago, patient describes an episode of blistering rash after sun exposure. She also describes a skin rash in the bandlike pattern in the left lower quadrant of the abdomen wrapping around her body which she believes this might be an shingles. This has occurred over the summer, has resolved completely, with the exception of persistent itching. Patient reports decreased appetite, but no significant weight loss. She complains of constipation, no diarrhea, and persistent abdominal pain for the past 2 years. She has had a single emesis episode 2 night ago, but none previously or since. She does have heartburn. She reports history of Escherichia coli infection 3 years ago with clay-colored stools. At that time, patient reports undergoing colonoscopy and EGD which demonstrated no evidence of H. pylori, but gastric ulcer with bleeding. According to the [facility] report, biopsies were negative for celiac sprue or Crohn's ulcerative colitis. Patient did have episodes of dysuria and burning with urination associated with frequency of April 2018 was diagnosed with cystitis treated with antibiotics. She describes an episode of swollen painful nodule developing in the lower neck anteriorly in June. No skin changes overlying the nodule, nodule has resolved at this time although some tenderness in the area still persists.  Patient denies any focal extremity or facial weakness. No focal paresthesias or sensory loss.  Oncological/hematological History: --CT A/P, 09/24/16: No retroperitoneal, mesenteric, or pelvic lymphadenopathy. Spleen is normal size --Labs, 10/04/16: WBC 10.1, Nl diff, Hgb 13.3, MCV 89.2, MCH ..., RDW 13.2, Plt 244; Ferritin 37.0, B1 12, B12 276   Medical History: Past Medical History:  Diagnosis Date  . Anemia   . Anxiety   . ASD (atrial septal defect) 06/11/2014   S/p patch repair  . Atherosclerosis of abdominal aorta ([place])  06/11/2014   Age advanced atherosclerosis of the infrarenal aorta  . Depression   . Dysmenorrhea   . Dysuria   . Encounter for long-term (current) use of other medications   . Headache   . Hematuria   . High cholesterol   . Left knee pain   . Migraine   . Other malaise and fatigue   . Pure hyperglyceridemia   . Routine general medical examination at a health care facility   . Shoulder pain   . Vitamin D deficiency   . Vitamin D deficiency   . VSD (ventricular septal defect), multiple 06/11/2014   S/p patch repair    Surgical History: Past Surgical History:  Procedure Laterality Date  . c seaction    . CESAREAN SECTION    . KNEE  ARTHROSCOPY W/ ACL RECONSTRUCTION AND HAMSTRING GRAFT Left   . vetricular hole      Family History: Family History  Problem Relation Age of Onset  . Depression Mother   . High blood pressure Mother   . Hyperlipidemia Mother   . Anxiety disorder Mother   . Hypertension Mother   . High blood pressure Father   . Hyperlipidemia Father   . Hypertension Father   . Crohn's disease Father   . Kidney disease Father   . Peripheral vascular disease Father   . Peripheral vascular disease Paternal Grandfather     Social History: Social History   Social History  . Marital status: Married    Spouse name: N/A  . Number of children: 4  . Years of education: College   Occupational History  . RN     [employer]   Social History Main Topics  . Smoking status: Never Smoker  . Smokeless tobacco: Never Used  . Alcohol use No  . Drug use: No  . Sexual activity: Not on file   Other Topics Concern  . Not on file   Social History Narrative   Lives at home with husband and children.   Right-handed.    Allergies: Allergies  Allergen Reactions  . Banana Other (See Comments)    Headaches  . Nsaids Nausea Only and Other (See Comments)    Also caused stomach ulcers (can only tolerate in limited doses)  . Other Other (See Comments)    SSRI(s): Makes the  patient feel "disconnected" Tricyclic antidepressants- Muscle spasms Sulfates- Itching  . Sulfa Antibiotics Hives    Medications:  Current Outpatient Prescriptions  Medication Sig Dispense Refill  . acetaminophen (TYLENOL) 650 MG CR tablet Take 650 mg by mouth every 8 (eight) hours as needed for pain. Reported on 07/01/2015    . famciclovir (FAMVIR) 500 MG tablet Take 1 tablet (500 mg total) by mouth 3 (three) times daily. 21 tablet 0  . fluticasone (FLONASE) 50 MCG/ACT nasal spray Place 2 sprays into both nostrils daily. 16 g 1  . gabapentin (NEURONTIN) 300 MG capsule Take 300 mg by mouth 4 (four) times daily.    . ondansetron (ZOFRAN ODT) 4 MG disintegrating tablet Take 1 tablet (4 mg total) by mouth every 8 (eight) hours as needed for nausea or vomiting. 10 tablet 0  . pantoprazole (PROTONIX) 40 MG tablet Take 1 tablet (40 mg total) by mouth as needed. (Patient taking differently: Take 40 mg by mouth daily as needed (indigestion). ) 90 tablet 1  . phenazopyridine (PYRIDIUM) 200 MG tablet Take 1 tablet (200 mg total) by mouth 3 (three) times daily as needed for pain. 6 tablet 0  . polyethylene glycol (MIRALAX / GLYCOLAX) packet Take 17 g by mouth daily. 14 each 0  . promethazine (PHENERGAN) 12.5 MG tablet Take 1 tablet (12.5 mg total) by mouth every 8 (eight) hours as needed for nausea or vomiting. 6 tablet 0  . SUMAtriptan (IMITREX) 50 MG tablet One tab at onset of migraine - may repeat in 2 hrs if needed (max dose: 2 tabs/24 hrs or 15/month).  Please call [phone] to schedule appt for continued refills. 15 tablet 1  . traMADol (ULTRAM) 50 MG tablet Take 1 tablet (50 mg total) by mouth every 8 (eight) hours as needed for severe pain. 90 tablet 0   No current facility-administered medications for this visit.     Review of Systems: Review of Systems  Constitutional: Positive for appetite change, diaphoresis,  fatigue and fever.  HENT:   Negative for hearing loss, lump/mass, mouth sores,  nosebleeds, sore throat, tinnitus, trouble swallowing and voice change.   Eyes: Negative.   Respiratory: Negative.   Cardiovascular: Negative.   Gastrointestinal: Positive for nausea. Negative for abdominal distention, abdominal pain, blood in stool, constipation, diarrhea and vomiting.  Endocrine: Negative.   Genitourinary: Negative.    Musculoskeletal: Positive for arthralgias, back pain, myalgias and neck pain. Negative for flank pain, gait problem and neck stiffness.  Skin: Positive for itching and rash. Negative for wound.  Neurological: Positive for headaches. Negative for dizziness, extremity weakness, gait problem, light-headedness, numbness, seizures and speech difficulty.  Hematological: Positive for adenopathy. Does not bruise/bleed easily.  Psychiatric/Behavioral: Negative.      PHYSICAL EXAMINATION Blood pressure 130/68, pulse 75, temperature 99.2 F (37.3 C), temperature source Oral, resp. rate 18, height 5' 6"  (1.676 m), weight 161 lb (73 kg), SpO2 100 %.  ECOG PERFORMANCE STATUS: 2 - Symptomatic, <50% confined to bed  Physical Exam  Constitutional: She is oriented to person, place, and time and well-developed, well-nourished, and in no distress.  HENT:  Head: Normocephalic and atraumatic.  Mouth/Throat: Oropharynx is clear and moist. No oropharyngeal exudate.  Eyes: Pupils are equal, round, and reactive to light. Conjunctivae are normal. No scleral icterus.  Neck: Normal range of motion. No thyromegaly present.  Cardiovascular: Normal rate and normal heart sounds.  Exam reveals no friction rub.   No murmur heard. Pulmonary/Chest: Effort normal. No respiratory distress. She has no wheezes. She has no rales.  Abdominal: Soft. Bowel sounds are normal. She exhibits no distension and no mass. There is no tenderness. There is no rebound and no guarding.  Musculoskeletal: She exhibits no edema.  Lymphadenopathy:    She has no cervical adenopathy.  Neurological: She is  alert and oriented to person, place, and time. No cranial nerve deficit.  Skin: Skin is warm and dry. No rash noted. She is not diaphoretic. No erythema. No pallor.     LABORATORY DATA: I have personally reviewed the data as listed: Appointment on 12/21/2016  Component Date Value Ref Range Status  . WBC 12/21/2016 7.8  3.9 - 10.3 10e3/uL Final  . NEUT# 12/21/2016 4.6  1.5 - 6.5 10e3/uL Final  . HGB 12/21/2016 13.2  11.6 - 15.9 g/dL Final  . HCT 12/21/2016 39.6  34.8 - 46.6 % Final  . Platelets 12/21/2016 179  145 - 400 10e3/uL Final  . MCV 12/21/2016 89.8  79.5 - 101.0 fL Final  . MCH 12/21/2016 29.9  25.1 - 34.0 pg Final  . MCHC 12/21/2016 33.3  31.5 - 36.0 g/dL Final  . RBC 12/21/2016 4.41  3.70 - 5.45 10e6/uL Final  . RDW 12/21/2016 12.3  11.2 - 14.5 % Final  . lymph# 12/21/2016 2.5  0.9 - 3.3 10e3/uL Final  . MONO# 12/21/2016 0.6  0.1 - 0.9 10e3/uL Final  . Eosinophils Absolute 12/21/2016 0.1  0.0 - 0.5 10e3/uL Final  . Basophils Absolute 12/21/2016 0.0  0.0 - 0.1 10e3/uL Final  . NEUT% 12/21/2016 58.6  38.4 - 76.8 % Final  . LYMPH% 12/21/2016 31.5  14.0 - 49.7 % Final  . MONO% 12/21/2016 7.8  0.0 - 14.0 % Final  . EOS% 12/21/2016 1.8  0.0 - 7.0 % Final  . BASO% 12/21/2016 0.3  0.0 - 2.0 % Final  . Retic % 12/21/2016 0.90  0.70 - 2.10 % Final  . Retic Ct Abs 12/21/2016 39.69  33.70 - 90.70  10e3/uL Final  . Immature Retic Fract 12/21/2016 2.40  1.60 - 10.00 % Final  . Ovalocytes 12/21/2016 slight  Negative Final  . White Cell Comments 12/21/2016 C/W auto diff   Final  . PLT EST 12/21/2016 Adequate  Adequate Final  . Sedimentation Rate-Westergren 12/21/2016 4  0 - 32 mm/hr Final  . Sodium 12/21/2016 140  136 - 145 mEq/L Final  . Potassium 12/21/2016 3.9  3.5 - 5.1 mEq/L Final  . Chloride 12/21/2016 106  98 - 109 mEq/L Final  . CO2 12/21/2016 25  22 - 29 mEq/L Final  . Glucose 12/21/2016 90  70 - 140 mg/dl Final   Glucose reference range is for nonfasting patients. Fasting  glucose reference range is 70- 100.  Marland Kitchen BUN 12/21/2016 9.4  7.0 - 26.0 mg/dL Final  . Creatinine 12/21/2016 0.7  0.6 - 1.1 mg/dL Final  . Total Bilirubin 12/21/2016 0.65  0.20 - 1.20 mg/dL Final  . Alkaline Phosphatase 12/21/2016 75  40 - 150 U/L Final  . AST 12/21/2016 18  5 - 34 U/L Final  . ALT 12/21/2016 13  0 - 55 U/L Final  . Total Protein 12/21/2016 7.5  6.4 - 8.3 g/dL Final  . Albumin 12/21/2016 3.9  3.5 - 5.0 g/dL Final  . Calcium 12/21/2016 9.4  8.4 - 10.4 mg/dL Final  . Anion Gap 12/21/2016 9  3 - 11 mEq/L Final  . EGFR 12/21/2016 >90  >90 ml/min/1.73 m2 Final   eGFR is calculated using the CKD-EPI Creatinine Equation (2009)  . LDH 12/21/2016 158  125 - 245 U/L Final  . CRP 12/21/2016 2.2  0.0 - 4.9 mg/L Final  . Ferritin 12/21/2016 46  9 - 269 ng/ml Final  . Vitamin B12 12/21/2016 424  232 - 1,245 pg/mL Final  . Methylmalonic Acid, Serum 12/21/2016 74  0 - 378 nmol/L Final  . Disclaimer: 12/21/2016 Comment   Final   Comment: This test was developed and its performance characteristics determined by [facility]. It has not been cleared or approved by the Food and Drug Administration.   . Folate 12/21/2016 >20.0  >3.0 ng/mL Final   Comment: A serum folate concentration of less than 3.1 ng/mL is considered to represent clinical deficiency.   . Homocysteine 12/21/2016 5.3  0.0 - 15.0 umol/L Final  . ANA Titer 1 12/21/2016 Negative   Final   Comment:                                                     Negative   <1:80                                                     Borderline  1:80                                                     Positive   >1:80   . RA Latex Turbid. 12/21/2016 <10.0  0.0 - 13.9 IU/mL Final  . Mono Qual W/Rflx Qn 12/21/2016 Negative  Negative  Final   Comment: The sensitivity of Heterophile antibody testing is 80-90%. [facility] Patient IgM testing offers higher sensitivity.          Ardath Sax, MD

## 2016-12-27 ENCOUNTER — Telehealth: Payer: Self-pay | Admitting: Medical

## 2016-12-27 ENCOUNTER — Ambulatory Visit (HOSPITAL_BASED_OUTPATIENT_CLINIC_OR_DEPARTMENT_OTHER): Payer: 59 | Admitting: Hematology and Oncology

## 2016-12-27 ENCOUNTER — Telehealth: Payer: Self-pay | Admitting: Hematology and Oncology

## 2016-12-27 VITALS — BP 124/56 | HR 81 | Temp 99.3°F | Resp 17 | Ht 66.0 in | Wt 160.8 lb

## 2016-12-27 DIAGNOSIS — G629 Polyneuropathy, unspecified: Secondary | ICD-10-CM

## 2016-12-27 DIAGNOSIS — R51 Headache: Secondary | ICD-10-CM

## 2016-12-27 DIAGNOSIS — R531 Weakness: Secondary | ICD-10-CM

## 2016-12-27 DIAGNOSIS — R799 Abnormal finding of blood chemistry, unspecified: Secondary | ICD-10-CM

## 2016-12-27 DIAGNOSIS — R109 Unspecified abdominal pain: Secondary | ICD-10-CM | POA: Diagnosis not present

## 2016-12-27 DIAGNOSIS — A689 Relapsing fever, unspecified: Secondary | ICD-10-CM

## 2016-12-27 DIAGNOSIS — K5900 Constipation, unspecified: Secondary | ICD-10-CM

## 2016-12-27 DIAGNOSIS — R110 Nausea: Secondary | ICD-10-CM

## 2016-12-27 DIAGNOSIS — R61 Generalized hyperhidrosis: Secondary | ICD-10-CM | POA: Diagnosis not present

## 2016-12-27 DIAGNOSIS — R5383 Other fatigue: Secondary | ICD-10-CM

## 2016-12-27 DIAGNOSIS — L299 Pruritus, unspecified: Secondary | ICD-10-CM

## 2016-12-27 DIAGNOSIS — R12 Heartburn: Secondary | ICD-10-CM

## 2016-12-27 NOTE — Telephone Encounter (Signed)
Pt called for lab results. Pt states she viewed labs on mychart and called nurse line and was told amonia was high. Pt states she was told to go to ED but pt wants to hear results from Esperanza Richters for him to tell her if it is an urgent result. Please advise.

## 2016-12-27 NOTE — Telephone Encounter (Signed)
Gave avs and calendar for November  °

## 2016-12-27 NOTE — Telephone Encounter (Signed)
Pt is requesting a call back.  Pt had labs yesterday   CB: [phone]

## 2016-12-28 ENCOUNTER — Other Ambulatory Visit: Payer: Self-pay

## 2016-12-28 ENCOUNTER — Telehealth: Payer: Self-pay

## 2016-12-28 ENCOUNTER — Other Ambulatory Visit: Payer: 59

## 2016-12-28 DIAGNOSIS — A689 Relapsing fever, unspecified: Secondary | ICD-10-CM | POA: Diagnosis not present

## 2016-12-28 DIAGNOSIS — Z862 Personal history of diseases of the blood and blood-forming organs and certain disorders involving the immune mechanism: Secondary | ICD-10-CM | POA: Diagnosis not present

## 2016-12-28 DIAGNOSIS — R509 Fever, unspecified: Secondary | ICD-10-CM | POA: Diagnosis not present

## 2016-12-28 DIAGNOSIS — G629 Polyneuropathy, unspecified: Secondary | ICD-10-CM

## 2016-12-28 NOTE — Telephone Encounter (Signed)
I did call patient yesterday afternoon and informed her of the troponin level of 0. She was not reporting any chest pain that time. Explained to her that was good and would not recommend emergency department evaluation and symptoms regarding chest appear to have resolved. But did remind her that if such symptoms did recur then be seen at emergency department.  Also discussed with her slight elevated ammonia level. She seeing the hematologist/oncologist today for follow-up. I asked her to point out to a specialist. Also patient states some recent abdomen pain which she did not mention when I saw her on the last visit. She already has scheduled appointment with GI M.D. [facility]. I asked her to my chart me message regarding which specialist. I would get staff called her office and point out that her ammonia level was elevated recently.  Also asked her to specify on my chart message any states that she feels that she needs off from work as she has missed many recently.

## 2016-12-28 NOTE — Telephone Encounter (Signed)
Spoke with pt this afternoon after being transferred from triage. Pt verbalized much frustration regarding conversation with Dr. Gweneth Dimitri yesterday. Dr. Gweneth Dimitri has been notified of patient perspective on the situation.   Apologized to pt a few times for her feelings regarding yesterday's visit. She said that it was not my fault, but that she wanted the doctor to be aware of how she perceived the visit went.   Some of the conversation noted as follows. Quotations from patient on the phone call: "The only reason that I went to Dr. Gweneth Dimitri was for my elevated WBC and fevers." The ED had informed the pt that she needed to f/u with a hematologist and watch her WBC count. "Last week he said it could be B12 deficiency, now, oh, it's MS. I am just confused. I do not like the way he talked to me about my situation at all.He was trying to pinpoint whether I had a fever or if I just felt hot. My temperature has been close to 100, 99.6/99.8 off and on for 4 months. That is not my normal temperature. That is not my normal temperature. He should have just left the psychosomatic part out of it. Just don't discount everything about psychosomatic. Why would I lie about my ankle being numb or the feeling in my fingers being gone. I just thought those were symptoms of fibromyalgia, and he said, 'No those are symptoms for b12 deficiency.' I am not sure if I am going to get the MRI of my brain or not. He should have just left the psychosomatic part out of it. I am resentful about the way he handled my visit yesterday. I will taper off the neurontin as he suggested. If he wants to see me I can follow up. If he doesn't want to see me, I don't really care. I just want to clarify that yes I have had a fever, low-grade or whatever you call it for four months."  Pt did not seem as frustrated at the end of the call, and I assured her that Dr. Gweneth Dimitri would review and we would let her know what would be needed for f/u.

## 2016-12-29 ENCOUNTER — Telehealth: Payer: Self-pay | Admitting: Medical

## 2016-12-29 ENCOUNTER — Encounter: Payer: Self-pay | Admitting: Medical

## 2016-12-29 LAB — LUTEINIZING HORMONE: LH: 5.8 m[IU]/mL

## 2016-12-29 LAB — CORTISOL: Cortisol: 7 ug/dL

## 2016-12-29 LAB — FOLLICLE STIMULATING HORMONE: FSH: 5.9 m[IU]/mL

## 2016-12-29 MED ORDER — LACTULOSE 10 GM/15ML PO SOLN: 10.0000 g | Freq: Two times a day (BID) | ORAL | 0 refills | Status: DC

## 2016-12-29 NOTE — Telephone Encounter (Signed)
Rx lactulose sent to pharmacy. Please explain to pt after 7 days use update me on if itching has improved. I wrote for large volume of lactulose but may just recommend 7 day use only.

## 2016-12-31 LAB — ESTROGENS, TOTAL: Estrogen: 101 pg/mL

## 2016-12-31 LAB — ACTH: ACTH: 16.5 pg/mL (ref 7.2–63.3)

## 2016-12-31 MED FILL — GENERLAC 10 GM/15 ML SOLN: 10 | 8 days supply | Qty: 240 | Fill #0

## 2016-12-31 NOTE — Telephone Encounter (Signed)
Pt aware/will advise if itching has not improved/thx dmf

## 2017-01-02 DIAGNOSIS — R110 Nausea: Secondary | ICD-10-CM | POA: Diagnosis not present

## 2017-01-02 DIAGNOSIS — R1011 Right upper quadrant pain: Secondary | ICD-10-CM | POA: Diagnosis not present

## 2017-01-02 DIAGNOSIS — E538 Deficiency of other specified B group vitamins: Secondary | ICD-10-CM | POA: Diagnosis not present

## 2017-01-02 DIAGNOSIS — G629 Polyneuropathy, unspecified: Secondary | ICD-10-CM | POA: Diagnosis not present

## 2017-01-02 DIAGNOSIS — R1013 Epigastric pain: Secondary | ICD-10-CM | POA: Diagnosis not present

## 2017-01-03 ENCOUNTER — Ambulatory Visit (HOSPITAL_COMMUNITY): Admission: RE | Admit: 2017-01-03 | Payer: 59 | Source: Ambulatory Visit

## 2017-01-03 NOTE — Progress Notes (Signed)
[facility] Follow-up Visit:  Assessment: Recurrent fever 51 y.o. female presenting to the clinic for evaluation of recurrent fevers with multitude of other symptoms including generalized fatigue, and weakness, possible night sweats, possible skin rashes. Overall, patient is presently afebrile. The history does not affording itself easily to a single medical condition. Lymphoproliferative processes such as B-cell lymphoproliferative disorders or a T-cell lymphoma can cause the symptoms mentioned. Some other myeloproliferative processes could also contribute. Patient does not have a history of anaphylactic reactions such mast cell disorders or eosinophilia are less likely.  Extensive lab work demonstrates no evidence of hematological abnormalities or presence of elevated inflammatory markers. Additional options for symptoms would include early menopause and hormonal disturbances. Alternatively, an autonomic dysfunction is possible. The only abnormal value discovered that is persistent it is mild elevation of ammonia level. On review of literature, it is possible that the patient has bacterial overgrowth. Additionally, somatization of a psychological disorder such as depression or anxiety is possible.  Plan: --Labs today as outlined below --MRI Brain --RTC 2 week to review  Voice recognition software was used and creation of this note. Despite my best effort at editing the text, some misspelling/errors may have occurred.  Orders Placed This Encounter  Procedures  . MR Brain W Contrast    Standing Status:   Future    Standing Expiration Date:   12/27/2017    Order Specific Question:   If indicated for the ordered procedure, I authorize the administration of contrast media per Radiology protocol    Answer:   Yes    Order Specific Question:   What is the patient's sedation requirement?    Answer:   No Sedation    Order Specific Question:   Does the patient have a pacemaker or  implanted devices?    Answer:   No    Order Specific Question:   Radiology Contrast Protocol - do NOT remove file path    Answer:   _0 [email or web address]    Order Specific Question:   Preferred imaging location?    Answer:   [facility] (table limit-350 lbs)  . ACTH    Standing Status:   Future    Number of Occurrences:   1    Standing Expiration Date:   12/27/2017  . Cortisol    Standing Status:   Future    Number of Occurrences:   1    Standing Expiration Date:   12/27/2017  . FSH-Follicle stimulating hormone    Standing Status:   Future    Number of Occurrences:   1    Standing Expiration Date:   12/28/2017  . LH-Luteinizing hormone    Standing Status:   Future    Number of Occurrences:   1    Standing Expiration Date:   12/28/2017  . Estrogens, Total    Cancer Staging No matching staging information was found for the patient.  All questions were answered.  . The patient knows to call the clinic with any problems, questions or concerns.  This note was electronically signed.    History of Presenting Illness Joanna Anderson is a 51 y.o. followed in the [facility] for evaluation of recurrent fevers. At the present time, patient reports recurrent fevers as high as 100.5 in July with generalized weakness, fatigue, and nausea. Nausea has improved from the summer, but still intermittently occurs. Patient reports intermittent sweats, including drenching night sweats. These have resolved, and patient only has occasional significant sweating with  time. It appears that patient has been doing well until about 3 years ago which time, she has developed a 9 month period of anemia continuous headaches which progressed to multitude of new symptoms and significantly altered the patient's activity levels. Prior to that, patient has been active outdoor runner, but has not been engaged in that since. She denies any previous tick bites. About 2 years ago, patient  describes an episode of blistering rash after sun exposure. She also describes a skin rash in the bandlike pattern in the left lower quadrant of the abdomen wrapping around her body which she believes this might be an shingles. This has occurred over the summer, has resolved completely, with the exception of persistent itching. Patient reports decreased appetite, but no significant weight loss. She complains of constipation, no diarrhea, and persistent abdominal pain for the past 2 years. She has had a single emesis episode 2 night ago, but none previously or since. She does have heartburn. She reports history of Escherichia coli infection 3 years ago with clay-colored stools. At that time, patient reports undergoing colonoscopy and EGD which demonstrated no evidence of H. pylori, but gastric ulcer with bleeding. According to the [facility] report, biopsies were negative for celiac sprue or Crohn's ulcerative colitis. Patient did have episodes of dysuria and burning with urination associated with frequency of April 2018 was diagnosed with cystitis treated with antibiotics. She describes an episode of swollen painful nodule developing in the lower neck anteriorly in June. No skin changes overlying the nodule, nodule has resolved at this time although some tenderness in the area still persists. Patient denies any focal extremity or facial weakness. No focal paresthesias or sensory loss.  Patient returns to the clinic to review findings of the lab work obtained during the previous visit.  Oncological/hematological History: --CT A/P, 09/24/16: No retroperitoneal, mesenteric, or pelvic lymphadenopathy. Spleen is normal size --Labs, 10/04/16: WBC 10.1, Nl diff, Hgb 13.3, MCV 89.2, MCH     ..., RDW 13.2, Plt 244; Ferritin 37.0, B1 12, B12 276 --Labs, 12/21/16: WBC   7.8, Nl diff, Hgb 13.2, MCV 89.8, MCH 29.9, RDW 12.3, Plt 179; LFTs WNL, LDH 158, Ammonia 47, ESR 4.0, CRP 2.2, ANA & RF negative; Ferritin 46, Vit  B12 424, Folate >20.0, MMA 74, Homocysteine 5.3;  Medical History: Past Medical History:  Diagnosis Date  . Anemia   . Anxiety   . ASD (atrial septal defect) 06/11/2014   S/p patch repair  . Atherosclerosis of abdominal aorta ([place]) 06/11/2014   Age advanced atherosclerosis of the infrarenal aorta  . Depression   . Dysmenorrhea   . Dysuria   . Encounter for long-term (current) use of other medications   . Headache   . Hematuria   . High cholesterol   . Left knee pain   . Migraine   . Other malaise and fatigue   . Pure hyperglyceridemia   . Routine general medical examination at a health care facility   . Shoulder pain   . Vitamin D deficiency   . Vitamin D deficiency   . VSD (ventricular septal defect), multiple 06/11/2014   S/p patch repair    Surgical History: Past Surgical History:  Procedure Laterality Date  . c seaction    . CESAREAN SECTION    . KNEE ARTHROSCOPY W/ ACL RECONSTRUCTION AND HAMSTRING GRAFT Left   . vetricular hole      Family History: Family History  Problem Relation Age of Onset  . Depression Mother   .  High blood pressure Mother   . Hyperlipidemia Mother   . Anxiety disorder Mother   . Hypertension Mother   . High blood pressure Father   . Hyperlipidemia Father   . Hypertension Father   . Crohn's disease Father   . Kidney disease Father   . Peripheral vascular disease Father   . Peripheral vascular disease Paternal Grandfather     Social History: Social History   Social History  . Marital status: Married    Spouse name: N/A  . Number of children: 4  . Years of education: College   Occupational History  . RN     [employer]   Social History Main Topics  . Smoking status: Never Smoker  . Smokeless tobacco: Never Used  . Alcohol use No  . Drug use: No  . Sexual activity: Not on file   Other Topics Concern  . Not on file   Social History Narrative   Lives at home with husband and children.   Right-handed.     Allergies: Allergies  Allergen Reactions  . Banana Other (See Comments)    Headaches  . Nsaids Nausea Only and Other (See Comments)    Also caused stomach ulcers (can only tolerate in limited doses)  . Other Other (See Comments)    SSRI(s): Makes the patient feel "disconnected" Tricyclic antidepressants- Muscle spasms Sulfates- Itching  . Sulfa Antibiotics Hives    Medications:  Current Outpatient Prescriptions  Medication Sig Dispense Refill  . acetaminophen (TYLENOL) 650 MG CR tablet Take 650 mg by mouth every 8 (eight) hours as needed for pain. Reported on 07/01/2015    . famciclovir (FAMVIR) 500 MG tablet Take 1 tablet (500 mg total) by mouth 3 (three) times daily. 21 tablet 0  . fluticasone (FLONASE) 50 MCG/ACT nasal spray Place 2 sprays into both nostrils daily. 16 g 1  . gabapentin (NEURONTIN) 300 MG capsule Take 300 mg by mouth 4 (four) times daily.    Marland Kitchen lactulose (CHRONULAC) 10 GM/15ML solution Take 15 mLs (10 g total) by mouth 2 (two) times daily. 240 mL 0  . ondansetron (ZOFRAN ODT) 4 MG disintegrating tablet Take 1 tablet (4 mg total) by mouth every 8 (eight) hours as needed for nausea or vomiting. 10 tablet 0  . pantoprazole (PROTONIX) 40 MG tablet Take 1 tablet (40 mg total) by mouth as needed. (Patient taking differently: Take 40 mg by mouth daily as needed (indigestion). ) 90 tablet 1  . phenazopyridine (PYRIDIUM) 200 MG tablet Take 1 tablet (200 mg total) by mouth 3 (three) times daily as needed for pain. 6 tablet 0  . polyethylene glycol (MIRALAX / GLYCOLAX) packet Take 17 g by mouth daily. 14 each 0  . promethazine (PHENERGAN) 12.5 MG tablet Take 1 tablet (12.5 mg total) by mouth every 8 (eight) hours as needed for nausea or vomiting. 6 tablet 0  . SUMAtriptan (IMITREX) 50 MG tablet One tab at onset of migraine - may repeat in 2 hrs if needed (max dose: 2 tabs/24 hrs or 15/month).  Please call [phone] to schedule appt for continued refills. 15 tablet 1  . traMADol  (ULTRAM) 50 MG tablet Take 1 tablet (50 mg total) by mouth every 8 (eight) hours as needed for severe pain. 90 tablet 0   No current facility-administered medications for this visit.     Review of Systems: Review of Systems  Constitutional: Positive for appetite change, diaphoresis, fatigue and fever.  HENT:   Negative for hearing loss, lump/mass,  mouth sores, nosebleeds, sore throat, tinnitus, trouble swallowing and voice change.   Eyes: Negative.   Respiratory: Negative.   Cardiovascular: Negative.   Gastrointestinal: Positive for nausea. Negative for abdominal distention, abdominal pain, blood in stool, constipation, diarrhea and vomiting.  Endocrine: Negative.   Genitourinary: Negative.    Musculoskeletal: Positive for arthralgias, back pain, myalgias and neck pain. Negative for flank pain, gait problem and neck stiffness.  Skin: Positive for itching and rash. Negative for wound.  Neurological: Positive for headaches. Negative for dizziness, extremity weakness, gait problem, light-headedness, numbness, seizures and speech difficulty.  Hematological: Positive for adenopathy. Does not bruise/bleed easily.  Psychiatric/Behavioral: Negative.      PHYSICAL EXAMINATION Blood pressure (!) 124/56, pulse 81, temperature 99.3 F (37.4 C), temperature source Oral, resp. rate 17, height 5' 6" (1.676 m), weight 160 lb 12.8 oz (72.9 kg), SpO2 98 %.  ECOG PERFORMANCE STATUS: 1 - Symptomatic but completely ambulatory  Physical Exam  Constitutional: She is oriented to person, place, and time and well-developed, well-nourished, and in no distress.  HENT:  Head: Normocephalic and atraumatic.  Mouth/Throat: Oropharynx is clear and moist. No oropharyngeal exudate.  Eyes: Pupils are equal, round, and reactive to light. Conjunctivae are normal. No scleral icterus.  Neck: Normal range of motion. No thyromegaly present.  Cardiovascular: Normal rate and normal heart sounds.  Exam reveals no friction  rub.   No murmur heard. Pulmonary/Chest: Effort normal. No respiratory distress. She has no wheezes. She has no rales.  Abdominal: Soft. Bowel sounds are normal. She exhibits no distension and no mass. There is no tenderness. There is no rebound and no guarding.  Musculoskeletal: She exhibits no edema.  Lymphadenopathy:    She has no cervical adenopathy.  Neurological: She is alert and oriented to person, place, and time. No cranial nerve deficit.  Skin: Skin is warm and dry. No rash noted. She is not diaphoretic. No erythema. No pallor.     LABORATORY DATA: I have personally reviewed the data as listed: Office Visit on 12/27/2016  Component Date Value Ref Range Status  . Estrogen 12/28/2016 101  pg/mL Final   Comment:                                            Prepubertal           <40                                            Female Cycle:                                              1-10 Days      61 - 394                                             11-20 Days     122 - 437  21-30 Days     156 - 350                                             Post-Menopausal      <40                                          [facility] Treatment for Ovulation                                             Induction:     400 - 800   Office Visit on 12/26/2016  Component Date Value Ref Range Status  . TNIDX 12/26/2016 0.00  0.00 - 0.06 ug/l Final  . Ammonia 12/26/2016 47* 11 - 35 umol/L Final  Appointment on 12/21/2016  Component Date Value Ref Range Status  . WBC 12/21/2016 7.8  3.9 - 10.3 10e3/uL Final  . NEUT# 12/21/2016 4.6  1.5 - 6.5 10e3/uL Final  . HGB 12/21/2016 13.2  11.6 - 15.9 g/dL Final  . HCT 12/21/2016 39.6  34.8 - 46.6 % Final  . Platelets 12/21/2016 179  145 - 400 10e3/uL Final  . MCV 12/21/2016 89.8  79.5 - 101.0 fL Final  . MCH 12/21/2016 29.9  25.1 - 34.0 pg Final  . MCHC 12/21/2016 33.3  31.5 - 36.0 g/dL Final  . RBC 12/21/2016 4.41  3.70 -  5.45 10e6/uL Final  . RDW 12/21/2016 12.3  11.2 - 14.5 % Final  . lymph# 12/21/2016 2.5  0.9 - 3.3 10e3/uL Final  . MONO# 12/21/2016 0.6  0.1 - 0.9 10e3/uL Final  . Eosinophils Absolute 12/21/2016 0.1  0.0 - 0.5 10e3/uL Final  . Basophils Absolute 12/21/2016 0.0  0.0 - 0.1 10e3/uL Final  . NEUT% 12/21/2016 58.6  38.4 - 76.8 % Final  . LYMPH% 12/21/2016 31.5  14.0 - 49.7 % Final  . MONO% 12/21/2016 7.8  0.0 - 14.0 % Final  . EOS% 12/21/2016 1.8  0.0 - 7.0 % Final  . BASO% 12/21/2016 0.3  0.0 - 2.0 % Final  . Retic % 12/21/2016 0.90  0.70 - 2.10 % Final  . Retic Ct Abs 12/21/2016 39.69  33.70 - 90.70 10e3/uL Final  . Immature Retic Fract 12/21/2016 2.40  1.60 - 10.00 % Final  . Ovalocytes 12/21/2016 slight  Negative Final  . White Cell Comments 12/21/2016 C/W auto diff   Final  . PLT EST 12/21/2016 Adequate  Adequate Final  . Sedimentation Rate-Westergren 12/21/2016 4  0 - 32 mm/hr Final  . Sodium 12/21/2016 140  136 - 145 mEq/L Final  . Potassium 12/21/2016 3.9  3.5 - 5.1 mEq/L Final  . Chloride 12/21/2016 106  98 - 109 mEq/L Final  . CO2 12/21/2016 25  22 - 29 mEq/L Final  . Glucose 12/21/2016 90  70 - 140 mg/dl Final   Glucose reference range is for nonfasting patients. Fasting glucose reference range is 70- 100.  Marland Kitchen BUN 12/21/2016 9.4  7.0 - 26.0 mg/dL Final  . Creatinine 12/21/2016 0.7  0.6 - 1.1 mg/dL Final  . Total Bilirubin 12/21/2016 0.65  0.20 - 1.20 mg/dL Final  .  Alkaline Phosphatase 12/21/2016 75  40 - 150 U/L Final  . AST 12/21/2016 18  5 - 34 U/L Final  . ALT 12/21/2016 13  0 - 55 U/L Final  . Total Protein 12/21/2016 7.5  6.4 - 8.3 g/dL Final  . Albumin 12/21/2016 3.9  3.5 - 5.0 g/dL Final  . Calcium 12/21/2016 9.4  8.4 - 10.4 mg/dL Final  . Anion Gap 12/21/2016 9  3 - 11 mEq/L Final  . EGFR 12/21/2016 >90  >90 ml/min/1.73 m2 Final   eGFR is calculated using the CKD-EPI Creatinine Equation (2009)  . LDH 12/21/2016 158  125 - 245 U/L Final  . CRP 12/21/2016 2.2  0.0 -  4.9 mg/L Final  . Ferritin 12/21/2016 46  9 - 269 ng/ml Final  . Vitamin B12 12/21/2016 424  232 - 1,245 pg/mL Final  . Methylmalonic Acid, Serum 12/21/2016 74  0 - 378 nmol/L Final  . Disclaimer: 12/21/2016 Comment   Final   Comment: This test was developed and its performance characteristics determined by [facility]. It has not been cleared or approved by the Food and Drug Administration.   . Folate 12/21/2016 >20.0  >3.0 ng/mL Final   Comment: A serum folate concentration of less than 3.1 ng/mL is considered to represent clinical deficiency.   . Homocysteine 12/21/2016 5.3  0.0 - 15.0 umol/L Final  . ANA Titer 1 12/21/2016 Negative   Final   Comment:                                                     Negative   <1:80                                                     Borderline  1:80                                                     Positive   >1:80   . RA Latex Turbid. 12/21/2016 <10.0  0.0 - 13.9 IU/mL Final  . Mono Qual W/Rflx Qn 12/21/2016 Negative  Negative Final   Comment: The sensitivity of Heterophile antibody testing is 80-90%. [facility] Patient IgM testing offers higher sensitivity.        Ardath Sax, MD

## 2017-01-03 NOTE — Assessment & Plan Note (Addendum)
51 y.o. female presenting to the clinic for evaluation of recurrent fevers with multitude of other symptoms including generalized fatigue, and weakness, possible night sweats, possible skin rashes. Overall, patient is presently afebrile. The history does not affording itself easily to a single medical condition. Lymphoproliferative processes such as B-cell lymphoproliferative disorders or a T-cell lymphoma can cause the symptoms mentioned. Some other myeloproliferative processes could also contribute. Patient does not have a history of anaphylactic reactions such mast cell disorders or eosinophilia are less likely.  Extensive lab work demonstrates no evidence of hematological abnormalities or presence of elevated inflammatory markers. Additional options for symptoms would include early menopause and hormonal disturbances. Alternatively, an autonomic dysfunction is possible. The only abnormal value discovered that is persistent it is mild elevation of ammonia level. On review of literature, it is possible that the patient has bacterial overgrowth. Additionally, somatization of a psychological disorder such as depression or anxiety is possible.  Plan: --Labs today as outlined below --MRI Brain --RTC 2 week to review

## 2017-01-08 ENCOUNTER — Encounter: Payer: Self-pay | Admitting: Medical

## 2017-01-08 DIAGNOSIS — E538 Deficiency of other specified B group vitamins: Secondary | ICD-10-CM | POA: Diagnosis not present

## 2017-01-09 ENCOUNTER — Telehealth: Payer: Self-pay | Admitting: *Deleted

## 2017-01-10 ENCOUNTER — Telehealth: Payer: Self-pay | Admitting: Medical

## 2017-01-10 NOTE — Telephone Encounter (Signed)
I did put in for intermittent leave renewal for 01/30/2017 with [employer] for my job to duplicate what I have had for the past year from your office. I recorded a total of 19 days of illness in the last 12 months due to my fibromyalgia symptoms and it helped me tremendously. I have an allowance of 2 occurrences of up to 3 days per month on the current Intermittent FMLA. There were months that I used them all and some not so much. If you can have the person that handles that give me a call, I would really appreciate it but mostly I WANT TO GET BETTER. Also I wanted you to know that one of the weeks your office filled out this past June- I did not use and it was cancelled and I worked all last week. The intermittent leave works best in case I have few days of unmanageable symptoms as it is so unpredictable.  This is the message that patient sent to me by my chart. If we have of the old FMLA forms filled out it would be helpful to fill out the new form.  Sometimes the form and change in look a lot more complicated. If that occurs let me know.  Just wanted to give you a heads up.

## 2017-01-14 NOTE — Telephone Encounter (Addendum)
There is no one apparent diagnosis for patient's paperwork, therefore; I forwarded to provider for diagnosis/SLS 10/15

## 2017-01-16 NOTE — Telephone Encounter (Signed)
FMLA paperwork with diagnosis and provider information completed; forwarded to provider with attached OV & patient phone notes/SLS 10/17

## 2017-01-16 NOTE — Telephone Encounter (Signed)
Copy & Pasted to open note.

## 2017-01-16 NOTE — Telephone Encounter (Signed)
Patient Calls  Saguier, Ramon Dredgedward, [place] routed conversation to You 6 days ago    Saguier, Ramon Dredgedward, PA-C 6 days ago      I did put in for intermittent leave renewal for 01/30/2017 with [employer] for my job to duplicate what I have had for the past year from your office. I recorded a total of 19 days of illness in the last 12 months due to my fibromyalgia symptoms and it helped me tremendously. I have an allowance of 2 occurrences of up to 3 days per month on the current Intermittent FMLA. There were months that I used them all and some not so much. If you can have the person that handles that give me a call, I would really appreciate it but mostly I WANT TO GET BETTER. Also I wanted you to know that one of the weeks your office filled out this past June- I did not use and it was cancelled and I worked all last week. The intermittent leave works best in case I have few days of unmanageable symptoms as it is so unpredictable.  This is the message that patient sent to me by my chart. If we have of the old FMLA forms filled out it would be helpful to fill out the new form.  Sometimes the form and change in look a lot more complicated. If that occurs let me know.  Just wanted to give you a heads up.        Documentation

## 2017-01-20 ENCOUNTER — Telehealth: Payer: Self-pay | Admitting: Medical

## 2017-01-20 NOTE — Telephone Encounter (Signed)
Filled out pt fmla forms and placed it on your desk.

## 2017-01-21 ENCOUNTER — Telehealth: Payer: Self-pay | Admitting: Medical

## 2017-01-21 NOTE — Telephone Encounter (Signed)
Relation to EA:VWUJpt:self Call back [phone] Pharmacy:  Reason for call:  Patient states the start date reflected 03/12/17 and it should reflect 01/30/17, patient would like a call when the changes have been corrected,please advise

## 2017-01-21 NOTE — Telephone Encounter (Signed)
Documents placed back on provider's desk/SLS 10/22

## 2017-01-21 NOTE — Telephone Encounter (Signed)
Relation to ZO:XWRUpt:self Call back number:[phone]   Reason for call:  Patient states hematologist advised patient to d/c gabapentin (NEURONTIN) 300 MG capsule due to ongoing low grade fever. Patient wanted to keep PCP inform she's currently not taking gabapentin (NEURONTIN) 300 MG capsule,please advise

## 2017-01-21 NOTE — Telephone Encounter (Addendum)
Relation to ZO:XWRUpt:self Call back [phone]  Reason for call:  Patient checking on the status of FMLA paperwork, patient states time is sensitive

## 2017-01-21 NOTE — Telephone Encounter (Signed)
Just received paperwork back from [place] today; he needed to add some information; has been faxed with confirmation/SLS 10/22

## 2017-01-22 NOTE — Telephone Encounter (Signed)
I was aware specialist recommended neurontin be stopped. Has she noticed improvement in her low grade fevers?

## 2017-01-22 NOTE — Telephone Encounter (Signed)
Would you notify correction made to paperwork. Wrote 01-30-2017 to 01-30-2018.

## 2017-01-25 DIAGNOSIS — K625 Hemorrhage of anus and rectum: Secondary | ICD-10-CM | POA: Diagnosis not present

## 2017-01-25 MED FILL — SUCRALFATE 1 GM TABLET: 1 | 30 days supply | Qty: 120 | Fill #0

## 2017-01-25 MED FILL — PROMETHAZINE 12.5 MG TABLET: 12.5 | 14 days supply | Qty: 56 | Fill #0

## 2017-01-27 ENCOUNTER — Encounter: Payer: Self-pay | Admitting: Medical

## 2017-01-30 DIAGNOSIS — K921 Melena: Secondary | ICD-10-CM | POA: Diagnosis not present

## 2017-01-30 DIAGNOSIS — R1011 Right upper quadrant pain: Secondary | ICD-10-CM | POA: Diagnosis not present

## 2017-01-30 DIAGNOSIS — R110 Nausea: Secondary | ICD-10-CM | POA: Diagnosis not present

## 2017-01-30 DIAGNOSIS — R1013 Epigastric pain: Secondary | ICD-10-CM | POA: Diagnosis not present

## 2017-01-30 DIAGNOSIS — R197 Diarrhea, unspecified: Secondary | ICD-10-CM | POA: Diagnosis not present

## 2017-02-07 DIAGNOSIS — R110 Nausea: Secondary | ICD-10-CM | POA: Diagnosis not present

## 2017-02-07 DIAGNOSIS — R1013 Epigastric pain: Secondary | ICD-10-CM | POA: Diagnosis not present

## 2017-02-14 ENCOUNTER — Telehealth: Payer: Self-pay | Admitting: Medical

## 2017-02-14 DIAGNOSIS — Z79899 Other long term (current) drug therapy: Secondary | ICD-10-CM

## 2017-02-14 NOTE — Telephone Encounter (Signed)
Self  Refill on Tramadol    ALSO, she would like to make provider aware that she just spoke with GI and was tested  positive for possible chron's disease.

## 2017-02-15 NOTE — Telephone Encounter (Signed)
Pt called checking status of Tramadol. Call in at [facility] Pt [phone].

## 2017-02-18 ENCOUNTER — Other Ambulatory Visit: Payer: 59

## 2017-02-18 DIAGNOSIS — Z79899 Other long term (current) drug therapy: Secondary | ICD-10-CM | POA: Diagnosis not present

## 2017-02-18 MED ORDER — TRAMADOL HCL 50 MG PO TABS: 50.0000 mg | ORAL_TABLET | Freq: Three times a day (TID) | ORAL | 0 refills | Status: DC | PRN

## 2017-02-18 MED FILL — traMADol HCL 50 MG TABS: 50 | 30 days supply | Qty: 90 | Fill #0

## 2017-02-18 MED FILL — POLYETHYLENE GLYCOL 3350 PO: 31 days supply | Qty: 527 | Fill #0

## 2017-02-18 NOTE — Telephone Encounter (Signed)
Last RX: 12/26/2016 Last OV:12/26/2016 Next WU:[id number]:none scheduled  UDS: 01/04/2016 CSC:01/04/2016

## 2017-02-18 NOTE — Addendum Note (Signed)
Addended by: Orlene OchRENCE, Korinna Tat N on: 02/18/2017 03:32 PM   Modules accepted: Orders

## 2017-02-18 NOTE — Telephone Encounter (Signed)
Notified pt rx is ready for pick up  

## 2017-02-18 NOTE — Telephone Encounter (Signed)
Signed the rx. Please make sure she comes in to sign contract and give uds.

## 2017-02-20 ENCOUNTER — Telehealth: Payer: Self-pay

## 2017-02-20 MED FILL — GAVILYTE-G SOLUTION: 236 | 1 days supply | Qty: 4000 | Fill #0

## 2017-02-20 MED FILL — OMEPRAZOLE DR 40 MG CAPSULE: 40 | 30 days supply | Qty: 60 | Fill #0

## 2017-02-20 NOTE — Telephone Encounter (Signed)
Patient came into the office to change her appointment dates. Printed avs and calender of rescheduled appointments made by the patient.

## 2017-02-22 MED FILL — SUMATRIPTAN SUCC 50 MG TABL: 50 | 30 days supply | Qty: 15 | Fill #1

## 2017-02-23 LAB — PAIN MGMT, PROFILE 8 W/CONF, U
6 ACETYLMORPHINE: NEGATIVE ng/mL (ref <10)
AMPHETAMINES: NEGATIVE ng/mL (ref <500)
Alcohol Metabolites: NEGATIVE ng/mL (ref <500)
Benzodiazepines: NEGATIVE ng/mL (ref <100)
Buprenorphine, Urine: NEGATIVE ng/mL (ref <5)
Cocaine Metabolite: NEGATIVE ng/mL (ref <150)
Creatinine: 120.6 mg/dL
Ethyl Glucuronide (ETG): NEGATIVE ng/mL (ref <500)
Ethyl Sulfate (ETS): NEGATIVE ng/mL (ref <100)
MDMA: NEGATIVE ng/mL (ref <500)
Marijuana Metabolite: NEGATIVE ng/mL (ref <20)
OXIDANT: NEGATIVE ug/mL (ref <200)
OXYCODONE: NEGATIVE ng/mL (ref <100)
Opiates: NEGATIVE ng/mL (ref <100)
pH: 7.14 (ref 4.5–9.0)

## 2017-02-25 ENCOUNTER — Other Ambulatory Visit: Payer: Self-pay

## 2017-02-26 ENCOUNTER — Other Ambulatory Visit: Payer: Self-pay

## 2017-02-26 DIAGNOSIS — A689 Relapsing fever, unspecified: Secondary | ICD-10-CM

## 2017-02-27 ENCOUNTER — Other Ambulatory Visit (HOSPITAL_BASED_OUTPATIENT_CLINIC_OR_DEPARTMENT_OTHER): Payer: 59

## 2017-02-27 ENCOUNTER — Ambulatory Visit: Payer: Self-pay | Admitting: Hematology and Oncology

## 2017-02-27 DIAGNOSIS — A689 Relapsing fever, unspecified: Secondary | ICD-10-CM | POA: Diagnosis not present

## 2017-02-27 LAB — COMPREHENSIVE METABOLIC PANEL
ALK PHOS: 65 U/L (ref 40–150)
ALT: 18 U/L (ref 0–55)
AST: 18 U/L (ref 5–34)
Albumin: 3.9 g/dL (ref 3.5–5.0)
Anion Gap: 8 mEq/L (ref 3–11)
BUN: 9.5 mg/dL (ref 7.0–26.0)
CALCIUM: 9.6 mg/dL (ref 8.4–10.4)
CHLORIDE: 106 meq/L (ref 98–109)
CO2: 28 mEq/L (ref 22–29)
Creatinine: 0.8 mg/dL (ref 0.6–1.1)
EGFR: >60 mL/min/{1.73_m2} (ref >60)
Glucose: 106 mg/dl (ref 70–140)
POTASSIUM: 4.1 meq/L (ref 3.5–5.1)
SODIUM: 142 meq/L (ref 136–145)
Total Bilirubin: 0.68 mg/dL (ref 0.20–1.20)
Total Protein: 7.3 g/dL (ref 6.4–8.3)

## 2017-02-27 LAB — CBC WITH DIFFERENTIAL/PLATELET
BASO%: 0.4 % (ref 0.0–2.0)
Basophils Absolute: 0 10*3/uL (ref 0.0–0.1)
EOS%: 1.7 % (ref 0.0–7.0)
Eosinophils Absolute: 0.1 10*3/uL (ref 0.0–0.5)
HCT: 39.7 % (ref 34.8–46.6)
HEMOGLOBIN: 13.2 g/dL (ref 11.6–15.9)
LYMPH#: 1.9 10*3/uL (ref 0.9–3.3)
LYMPH%: 25.4 % (ref 14.0–49.7)
MCH: 29.3 pg (ref 25.1–34.0)
MCHC: 33.3 g/dL (ref 31.5–36.0)
MCV: 87.8 fL (ref 79.5–101.0)
MONO#: 0.5 10*3/uL (ref 0.1–0.9)
MONO%: 7.2 % (ref 0.0–14.0)
NEUT%: 65.3 % (ref 38.4–76.8)
NEUTROS ABS: 4.9 10*3/uL (ref 1.5–6.5)
PLATELETS: 180 10*3/uL (ref 145–400)
RBC: 4.52 10*6/uL (ref 3.70–5.45)
RDW: 12.5 % (ref 11.2–14.5)
WBC: 7.6 10*3/uL (ref 3.9–10.3)

## 2017-02-28 LAB — SEDIMENTATION RATE: Sedimentation Rate-Westergren: 10 mm/hr (ref 0–32)

## 2017-02-28 LAB — C-REACTIVE PROTEIN: CRP: 1.6 mg/L (ref 0.0–4.9)

## 2017-03-01 ENCOUNTER — Ambulatory Visit (HOSPITAL_BASED_OUTPATIENT_CLINIC_OR_DEPARTMENT_OTHER): Payer: 59 | Admitting: Hematology and Oncology

## 2017-03-01 VITALS — BP 109/65 | HR 84 | Temp 98.7°F | Resp 18 | Ht 66.0 in | Wt 159.0 lb

## 2017-03-01 DIAGNOSIS — K5900 Constipation, unspecified: Secondary | ICD-10-CM | POA: Diagnosis not present

## 2017-03-01 DIAGNOSIS — R1084 Generalized abdominal pain: Secondary | ICD-10-CM | POA: Diagnosis not present

## 2017-03-01 DIAGNOSIS — R1013 Epigastric pain: Secondary | ICD-10-CM | POA: Diagnosis not present

## 2017-03-01 DIAGNOSIS — R531 Weakness: Secondary | ICD-10-CM

## 2017-03-01 DIAGNOSIS — R630 Anorexia: Secondary | ICD-10-CM | POA: Diagnosis not present

## 2017-03-01 DIAGNOSIS — R195 Other fecal abnormalities: Secondary | ICD-10-CM | POA: Diagnosis not present

## 2017-03-01 DIAGNOSIS — R5383 Other fatigue: Secondary | ICD-10-CM | POA: Diagnosis not present

## 2017-03-01 DIAGNOSIS — K5730 Diverticulosis of large intestine without perforation or abscess without bleeding: Secondary | ICD-10-CM | POA: Diagnosis not present

## 2017-03-01 DIAGNOSIS — Z8379 Family history of other diseases of the digestive system: Secondary | ICD-10-CM | POA: Diagnosis not present

## 2017-03-01 DIAGNOSIS — R1110 Vomiting, unspecified: Secondary | ICD-10-CM | POA: Diagnosis not present

## 2017-03-01 DIAGNOSIS — R509 Fever, unspecified: Secondary | ICD-10-CM

## 2017-03-01 DIAGNOSIS — G629 Polyneuropathy, unspecified: Secondary | ICD-10-CM

## 2017-03-01 DIAGNOSIS — A689 Relapsing fever, unspecified: Secondary | ICD-10-CM

## 2017-03-01 DIAGNOSIS — R197 Diarrhea, unspecified: Secondary | ICD-10-CM | POA: Diagnosis not present

## 2017-03-12 ENCOUNTER — Ambulatory Visit: Payer: 59 | Admitting: Allergy and Immunology

## 2017-03-21 ENCOUNTER — Other Ambulatory Visit: Payer: Self-pay

## 2017-03-21 ENCOUNTER — Ambulatory Visit: Payer: Self-pay | Admitting: Medical

## 2017-03-21 ENCOUNTER — Telehealth: Payer: Self-pay | Admitting: Medical

## 2017-03-21 ENCOUNTER — Emergency Department (HOSPITAL_BASED_OUTPATIENT_CLINIC_OR_DEPARTMENT_OTHER)
Admission: EM | Admit: 2017-03-21 | Discharge: 2017-03-21 | Disposition: A | Discharge status: Home / Self Care | Payer: 59 | Attending: Emergency Medicine | Admitting: Emergency Medicine

## 2017-03-21 ENCOUNTER — Encounter (HOSPITAL_BASED_OUTPATIENT_CLINIC_OR_DEPARTMENT_OTHER): Payer: Self-pay | Admitting: Emergency Medicine

## 2017-03-21 DIAGNOSIS — Y939 Activity, unspecified: Secondary | ICD-10-CM | POA: Insufficient documentation

## 2017-03-21 DIAGNOSIS — Z79899 Other long term (current) drug therapy: Secondary | ICD-10-CM | POA: Diagnosis not present

## 2017-03-21 DIAGNOSIS — S161XXA Strain of muscle, fascia and tendon at neck level, initial encounter: Secondary | ICD-10-CM | POA: Diagnosis not present

## 2017-03-21 DIAGNOSIS — Y999 Unspecified external cause status: Secondary | ICD-10-CM | POA: Diagnosis not present

## 2017-03-21 DIAGNOSIS — M5442 Lumbago with sciatica, left side: Secondary | ICD-10-CM

## 2017-03-21 DIAGNOSIS — M545 Low back pain: Secondary | ICD-10-CM | POA: Diagnosis not present

## 2017-03-21 DIAGNOSIS — Y92410 Unspecified street and highway as the place of occurrence of the external cause: Secondary | ICD-10-CM | POA: Diagnosis not present

## 2017-03-21 MED ORDER — TRAMADOL HCL 50 MG PO TABS: 50.0000 mg | ORAL_TABLET | Freq: Three times a day (TID) | ORAL | 0 refills | Status: DC | PRN

## 2017-03-21 MED ORDER — METHOCARBAMOL 500 MG PO TABS: 500.0000 mg | ORAL_TABLET | Freq: Four times a day (QID) | ORAL | 0 refills | Status: DC

## 2017-03-21 MED ORDER — PREDNISONE 20 MG PO TABS: ORAL_TABLET | ORAL | 0 refills | Status: DC
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
## 2017-03-21 NOTE — Telephone Encounter (Signed)
I did review patient's emergency department note.  As you know she showed up about 30 minutes late for the appointment.  I had blocked off 30-minute slot for her since her appointments in the past typically take more than the standard 15-minute appointment slot.  Her coming in 30 minutes late made it impossible for me to see her.  The front reception staff informed her of this and therefore she went to the emergency department.  I reviewed that note and I am going to refill her tramadol.  I will print that prescription and you can send that prescription to her pharmacy.  Would you call patient and notify her description was sent in.  Also regarding follow-up in the emergency department she will need a 30-minute appointment slot.

## 2017-03-21 NOTE — Telephone Encounter (Signed)
Copied from CRM [phone]. Topic: Quick Communication - Patient Running Late >> Mar 21, 2017  5:23 PM Rudi CocoLathan, Redonna Wilbert M, NT wrote: Patient called and is running 20minutes late.  Pt. Said she is up the street and appt. Was sched. For 5pm let her know about late policy.   Route to department's PEC pool.
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
## 2017-03-21 NOTE — Telephone Encounter (Signed)
Patient called and stts that she needs to get a refill with a [facility] - Please call patient once meds has been refill   [phone].

## 2017-03-22 MED FILL — traMADol HCL 50 MG TABS: 50 | 30 days supply | Qty: 90 | Fill #0

## 2017-03-22 NOTE — Telephone Encounter (Deleted)
R

## 2017-04-05 NOTE — Progress Notes (Signed)
[facility] Follow-up Visit:  Assessment: Recurrent fever 52 y.o. female presenting to the clinic for evaluation of recurrent fevers with multitude of other symptoms including generalized fatigue, and weakness, possible night sweats, possible skin rashes. Overall, patient is presently afebrile. The history does not affording itself easily to a single medical condition. Lymphoproliferative processes such as B-cell lymphoproliferative disorders or a T-cell lymphoma can cause the symptoms mentioned. Some other myeloproliferative processes could also contribute. Patient does not have a history of anaphylactic reactions such mast cell disorders or eosinophilia are less likely.  Extensive lab work demonstrates no evidence of hematological abnormalities or presence of elevated inflammatory markers. Additional options for symptoms would include early menopause and hormonal disturbances. Alternatively, an autonomic dysfunction is possible. The only abnormal value discovered that is persistent it is mild elevation of ammonia level. On review of literature, it is possible that the patient has bacterial overgrowth. Additionally, somatization of a psychological disorder such as depression or anxiety is possible.  Additional interim workup obtained by gastroenterology demonstrates findings that are suggestive for possible ulcerative colitis or Crohn's disease.  If confirmed, this could certainly explain the patient's symptoms.  I will defer to the gastroenterology expertise to continue further evaluation.  From hematological standpoint, there is no evidence of primary hematological disorder at this time.  Plan: --Continue follow-up with other providers.  Return to our clinic as needed if additional hematological abnormalities are discovered in the future.  Voice recognition software was used and creation of this note. Despite my best effort at editing the text, some misspelling/errors may have  occurred.  No orders of the defined types were placed in this encounter.   Cancer Staging No matching staging information was found for the patient.  All questions were answered.  . The patient knows to call the clinic with any problems, questions or concerns.  This note was electronically signed.    History of Presenting Illness Joanna Anderson is a 52 y.o. followed in the [facility] for evaluation of recurrent fevers. At the present time, patient reports recurrent fevers as high as 100.5 in July with generalized weakness, fatigue, and nausea. Nausea has improved from the summer, but still intermittently occurs. Patient reports intermittent sweats, including drenching night sweats. These have resolved, and patient only has occasional significant sweating with time. It appears that patient has been doing well until about 3 years ago which time, she has developed a 9 month period of anemia continuous headaches which progressed to multitude of new symptoms and significantly altered the patient's activity levels. Prior to that, patient has been active outdoor runner, but has not been engaged in that since. She denies any previous tick bites. About 2 years ago, patient describes an episode of blistering rash after sun exposure. She also describes a skin rash in the bandlike pattern in the left lower quadrant of the abdomen wrapping around her body which she believes this might be an shingles. This has occurred over the summer, has resolved completely, with the exception of persistent itching. Patient reports decreased appetite, but no significant weight loss. She complains of constipation, no diarrhea, and persistent abdominal pain for the past 2 years. She has had a single emesis episode 2 night ago, but none previously or since. She does have heartburn. She reports history of Escherichia coli infection 3 years ago with clay-colored stools. At that time, patient reports undergoing colonoscopy and EGD  which demonstrated no evidence of H. pylori, but gastric ulcer with bleeding.  According to the [facility] report, biopsies were negative for celiac sprue or Crohn's ulcerative colitis. Patient did have episodes of dysuria and burning with urination associated with frequency of April 2018 was diagnosed with cystitis treated with antibiotics. She describes an episode of swollen painful nodule developing in the lower neck anteriorly in June. No skin changes overlying the nodule, nodule has resolved at this time although some tenderness in the area still persists. Patient denies any focal extremity or facial weakness. No focal paresthesias or sensory loss.  Following the last visit to the clinic, additional assessment was requested including lab work and brain.  Nevertheless, patient did not feel that I was addressing her symptoms adequately and canceled her scheduled follow-ups and did not obtain the labs or the MRI requested.  Presents to my clinic today.  When questioned why she has returned to my clinic, she does not have a clear idea.  In the interim, she has been undergoing evaluation by other providers.  Currently, a possibility of Crohn's colitis or ulcerative colitis has been raised based on recent lab work.  Further workup is planned by gastroenterology.  Patient denies any new or change symptoms since last visit to the clinic.  Continues to have poor energy.  Continues to have recurrent subjective fevers.  Oncological/hematological History: --CT A/P, 09/24/16: No retroperitoneal, mesenteric, or pelvic lymphadenopathy. Spleen is normal size --Labs, 10/04/16: WBC 10.1, Nl diff, Hgb 13.3, MCV 89.2, MCH     ..., RDW 13.2, Plt 244; Ferritin 37.0, B1 12, B12 276 --Labs, 12/21/16: WBC   7.8, Nl diff, Hgb 13.2, MCV 89.8, MCH 29.9, RDW 12.3, Plt 179; LFTs WNL, LDH 158, Ammonia 47, ESR 4.0, CRP 2.2, ANA & RF negative; Ferritin 46, Vit B12 424, Folate >20.0, MMA 74, Homocysteine 5.3;  Medical History: Past  Medical History:  Diagnosis Date  . Anemia   . Anxiety   . ASD (atrial septal defect) 06/11/2014   S/p patch repair  . Atherosclerosis of abdominal aorta ([facility]) 06/11/2014   Age advanced atherosclerosis of the infrarenal aorta  . Depression   . Dysmenorrhea   . Dysuria   . Encounter for long-term (current) use of other medications   . Headache   . Hematuria   . High cholesterol   . Left knee pain   . Migraine   . Other malaise and fatigue   . Pure hyperglyceridemia   . Routine general medical examination at a health care facility   . Shoulder pain   . Vitamin D deficiency   . Vitamin D deficiency   . VSD (ventricular septal defect), multiple 06/11/2014   S/p patch repair    Surgical History: Past Surgical History:  Procedure Laterality Date  . c seaction    . CESAREAN SECTION    . KNEE ARTHROSCOPY W/ ACL RECONSTRUCTION AND HAMSTRING GRAFT Left   . vetricular hole      Family History: Family History  Problem Relation Age of Onset  . Depression Mother   . High blood pressure Mother   . Hyperlipidemia Mother   . Anxiety disorder Mother   . Hypertension Mother   . High blood pressure Father   . Hyperlipidemia Father   . Hypertension Father   . Crohn's disease Father   . Kidney disease Father   . Peripheral vascular disease Father   . Peripheral vascular disease Paternal Grandfather     Social History: Social History   Socioeconomic History  . Marital status: Married    Spouse  name: Not on file  . Number of children: 4  . Years of education: College  . Highest education level: Not on file  Social Needs  . Financial resource strain: Not on file  . Food insecurity - worry: Not on file  . Food insecurity - inability: Not on file  . Transportation needs - medical: Not on file  . Transportation needs - non-medical: Not on file  Occupational History  . 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 Smoking status: Never Smoker  . Smokeless tobacco: Never  Used  Substance and Sexual Activity  . Alcohol use: No    Alcohol/week: 0.0 oz  . Drug use: No  . Sexual activity: Not on file  Other Topics Concern  . Not on file  Social History Narrative   Lives at home with husband and children.   Right-handed.    Allergies: Allergies  Allergen Reactions  . Banana Other (See Comments)    Headaches  . Nsaids Nausea Only and Other (See Comments)    Also caused stomach ulcers (can only tolerate in limited doses)  . Other Other (See Comments)    SSRI(s): Makes the patient feel "disconnected" Tricyclic antidepressants- Muscle spasms Sulfates- Itching  . Sulfa Antibiotics Hives    Medications:  Current Outpatient Medications  Medication Sig Dispense Refill  . sucralfate (CARAFATE) 1 g tablet Take 1 g by mouth 4 (four) times daily -  with meals and at bedtime.    Marland Kitchen acetaminophen (TYLENOL) 650 MG CR tablet Take 650 mg by mouth every 8 (eight) hours as needed for pain. Reported on 07/01/2015    . famciclovir (FAMVIR) 500 MG tablet Take 1 tablet (500 mg total) by mouth 3 (three) times daily. 21 tablet 0  . fluticasone (FLONASE) 50 MCG/ACT nasal spray Place 2 sprays into both nostrils daily. 16 g 1  . gabapentin (NEURONTIN) 300 MG capsule Take 300 mg by mouth 4 (four) times daily.    Marland Kitchen lactulose (CHRONULAC) 10 GM/15ML solution Take 15 mLs (10 g total) by mouth 2 (two) times daily. 240 mL 0  . methocarbamol (ROBAXIN) 500 MG tablet Take 1 tablet (500 mg total) by mouth 4 (four) times daily. 30 tablet 0  . ondansetron (ZOFRAN ODT) 4 MG disintegrating tablet Take 1 tablet (4 mg total) by mouth every 8 (eight) hours as needed for nausea or vomiting. 10 tablet 0  . polyethylene glycol (MIRALAX / GLYCOLAX) packet Take 17 g by mouth daily. 14 each 0  . predniSONE (DELTASONE) 20 MG tablet 3 Tabs PO Days 1-3, then 2 tabs PO Days 4-6, then 1 tab PO Day 7-9, then Half Tab PO Day 10-12 20 tablet 0  . promethazine (PHENERGAN) 12.5 MG tablet Take 1 tablet (12.5 mg  total) by mouth every 8 (eight) hours as needed for nausea or vomiting. 6 tablet 0  . SUMAtriptan (IMITREX) 50 MG tablet One tab at onset of migraine - may repeat in 2 hrs if needed (max dose: 2 tabs/24 hrs or 15/month).  Please call [phone] to schedule appt for continued refills. 15 tablet 1  . traMADol (ULTRAM) 50 MG tablet Take 1 tablet (50 mg total) by mouth every 8 (eight) hours as needed for severe pain. 90 tablet 0   No current facility-administered medications for this visit.     Review of Systems: Review of Systems  Constitutional: Positive for appetite change, diaphoresis, fatigue and fever.  HENT:   Negative for hearing loss, lump/mass, mouth  sores, nosebleeds, sore throat, tinnitus, trouble swallowing and voice change.   Eyes: Negative.   Respiratory: Negative.   Cardiovascular: Negative.   Gastrointestinal: Positive for nausea. Negative for abdominal distention, abdominal pain, blood in stool, constipation, diarrhea and vomiting.  Endocrine: Negative.   Genitourinary: Negative.    Musculoskeletal: Positive for arthralgias, back pain, myalgias and neck pain. Negative for flank pain, gait problem and neck stiffness.  Skin: Positive for itching and rash. Negative for wound.  Neurological: Positive for headaches. Negative for dizziness, extremity weakness, gait problem, light-headedness, numbness, seizures and speech difficulty.  Hematological: Positive for adenopathy. Does not bruise/bleed easily.  Psychiatric/Behavioral: Negative.      PHYSICAL EXAMINATION Blood pressure 109/65, pulse 84, temperature 98.7 F (37.1 C), resp. rate 18, height 5' 6"  (1.676 m), weight 159 lb (72.1 kg), last menstrual period 02/01/2017, SpO2 95 %.  ECOG PERFORMANCE STATUS: 1 - Symptomatic but completely ambulatory  Physical Exam  Constitutional: She is oriented to person, place, and time and well-developed, well-nourished, and in no distress.  HENT:  Head: Normocephalic and atraumatic.   Mouth/Throat: Oropharynx is clear and moist. No oropharyngeal exudate.  Eyes: Conjunctivae are normal. Pupils are equal, round, and reactive to light. No scleral icterus.  Neck: Normal range of motion. No thyromegaly present.  Cardiovascular: Normal rate and normal heart sounds. Exam reveals no friction rub.  No murmur heard. Pulmonary/Chest: Effort normal. No respiratory distress. She has no wheezes. She has no rales.  Abdominal: Soft. Bowel sounds are normal. She exhibits no distension and no mass. There is no tenderness. There is no rebound and no guarding.  Musculoskeletal: She exhibits no edema.  Lymphadenopathy:    She has no cervical adenopathy.  Neurological: She is alert and oriented to person, place, and time. No cranial nerve deficit.  Skin: Skin is warm and dry. No rash noted. She is not diaphoretic. No erythema. No pallor.     LABORATORY DATA: I have personally reviewed the data as listed: Appointment on 02/27/2017  Component Date Value Ref Range Status  . CRP 02/27/2017 1.6  0.0 - 4.9 mg/L Final  . Sedimentation Rate-Westergren 02/27/2017 10  0 - 32 mm/hr Final  . Sodium 02/27/2017 142  136 - 145 mEq/L Final  . Potassium 02/27/2017 4.1  3.5 - 5.1 mEq/L Final  . Chloride 02/27/2017 106  98 - 109 mEq/L Final  . CO2 02/27/2017 28  22 - 29 mEq/L Final  . Glucose 02/27/2017 106  70 - 140 mg/dl Final   Glucose reference range is for nonfasting patients. Fasting glucose reference range is 70- 100.  Marland Kitchen BUN 02/27/2017 9.5  7.0 - 26.0 mg/dL Final  . Creatinine 02/27/2017 0.8  0.6 - 1.1 mg/dL Final  . Total Bilirubin 02/27/2017 0.68  0.20 - 1.20 mg/dL Final  . Alkaline Phosphatase 02/27/2017 65  40 - 150 U/L Final  . AST 02/27/2017 18  5 - 34 U/L Final  . ALT 02/27/2017 18  0 - 55 U/L Final  . Total Protein 02/27/2017 7.3  6.4 - 8.3 g/dL Final  . Albumin 02/27/2017 3.9  3.5 - 5.0 g/dL Final  . Calcium 02/27/2017 9.6  8.4 - 10.4 mg/dL Final  . Anion Gap 02/27/2017 8  3 - 11  mEq/L Final  . EGFR 02/27/2017 >60  >60 ml/min/1.73 m2 Final   eGFR is calculated using the CKD-EPI Creatinine Equation (2009)  . WBC 02/27/2017 7.6  3.9 - 10.3 10e3/uL Final  . NEUT# 02/27/2017 4.9  1.5 - 6.5  10e3/uL Final  . HGB 02/27/2017 13.2  11.6 - 15.9 g/dL Final  . HCT 02/27/2017 39.7  34.8 - 46.6 % Final  . Platelets 02/27/2017 180  145 - 400 10e3/uL Final  . MCV 02/27/2017 87.8  79.5 - 101.0 fL Final  . MCH 02/27/2017 29.3  25.1 - 34.0 pg Final  . MCHC 02/27/2017 33.3  31.5 - 36.0 g/dL Final  . RBC 02/27/2017 4.52  3.70 - 5.45 10e6/uL Final  . RDW 02/27/2017 12.5  11.2 - 14.5 % Final  . lymph# 02/27/2017 1.9  0.9 - 3.3 10e3/uL Final  . MONO# 02/27/2017 0.5  0.1 - 0.9 10e3/uL Final  . Eosinophils Absolute 02/27/2017 0.1  0.0 - 0.5 10e3/uL Final  . Basophils Absolute 02/27/2017 0.0  0.0 - 0.1 10e3/uL Final  . NEUT% 02/27/2017 65.3  38.4 - 76.8 % Final  . LYMPH% 02/27/2017 25.4  14.0 - 49.7 % Final  . MONO% 02/27/2017 7.2  0.0 - 14.0 % Final  . EOS% 02/27/2017 1.7  0.0 - 7.0 % Final  . BASO% 02/27/2017 0.4  0.0 - 2.0 % Final       Ardath Sax, MD

## 2017-04-05 NOTE — Assessment & Plan Note (Signed)
52 y.o. female presenting to the clinic for evaluation of recurrent fevers with multitude of other symptoms including generalized fatigue, and weakness, possible night sweats, possible skin rashes. Overall, patient is presently afebrile. The history does not affording itself easily to a single medical condition. Lymphoproliferative processes such as B-cell lymphoproliferative disorders or a T-cell lymphoma can cause the symptoms mentioned. Some other myeloproliferative processes could also contribute. Patient does not have a history of anaphylactic reactions such mast cell disorders or eosinophilia are less likely.  Extensive lab work demonstrates no evidence of hematological abnormalities or presence of elevated inflammatory markers. Additional options for symptoms would include early menopause and hormonal disturbances. Alternatively, an autonomic dysfunction is possible. The only abnormal value discovered that is persistent it is mild elevation of ammonia level. On review of literature, it is possible that the patient has bacterial overgrowth. Additionally, somatization of a psychological disorder such as depression or anxiety is possible.  Additional interim workup obtained by gastroenterology demonstrates findings that are suggestive for possible ulcerative colitis or Crohn's disease.  If confirmed, this could certainly explain the patient's symptoms.  I will defer to the gastroenterology expertise to continue further evaluation.  From hematological standpoint, there is no evidence of primary hematological disorder at this time.  Plan: --Continue follow-up with other providers.  Return to our clinic as needed if additional hematological abnormalities are discovered in the future.

## 2017-04-11 ENCOUNTER — Ambulatory Visit: Payer: Self-pay | Admitting: Medical

## 2017-04-11 ENCOUNTER — Ambulatory Visit (HOSPITAL_BASED_OUTPATIENT_CLINIC_OR_DEPARTMENT_OTHER)
Admission: RE | Admit: 2017-04-11 | Discharge: 2017-04-11 | Disposition: A | Discharge status: Home / Self Care | Payer: 59 | Source: Ambulatory Visit | Attending: Medical | Admitting: Medical

## 2017-04-11 ENCOUNTER — Encounter: Payer: Self-pay | Admitting: Medical

## 2017-04-11 ENCOUNTER — Ambulatory Visit (INDEPENDENT_AMBULATORY_CARE_PROVIDER_SITE_OTHER): Payer: 59 | Admitting: Medical

## 2017-04-11 VITALS — BP 123/52 | HR 71 | Temp 98.5°F | Resp 16 | Ht 66.0 in | Wt 164.0 lb

## 2017-04-11 DIAGNOSIS — M542 Cervicalgia: Secondary | ICD-10-CM

## 2017-04-11 DIAGNOSIS — M25552 Pain in left hip: Secondary | ICD-10-CM | POA: Diagnosis not present

## 2017-04-11 DIAGNOSIS — M545 Low back pain: Secondary | ICD-10-CM | POA: Diagnosis not present

## 2017-04-11 DIAGNOSIS — M533 Sacrococcygeal disorders, not elsewhere classified: Secondary | ICD-10-CM

## 2017-04-11 DIAGNOSIS — M5442 Lumbago with sciatica, left side: Secondary | ICD-10-CM | POA: Diagnosis not present

## 2017-04-11 DIAGNOSIS — M8580 Other specified disorders of bone density and structure, unspecified site: Secondary | ICD-10-CM | POA: Insufficient documentation

## 2017-04-11 MED ORDER — GABAPENTIN 300 MG PO CAPS: 300.0000 mg | ORAL_CAPSULE | Freq: Four times a day (QID) | ORAL | 1 refills | Status: DC

## 2017-04-11 MED ORDER — METHOCARBAMOL 500 MG PO TABS: ORAL_TABLET | ORAL | 1 refills | Status: DC

## 2017-04-11 MED FILL — GABAPENTIN 300 MG CAPSULE: 300 | 30 days supply | Qty: 120 | Fill #0 | Status: TO

## 2017-04-11 MED FILL — METHOCARBAMOL 500 MG TABS: 500 | 30 days supply | Qty: 30 | Fill #0 | Status: TO

## 2017-04-11 NOTE — Patient Instructions (Signed)
For your areas of pain will prescribe robaxin to use at night as you note that helped when ED gave.  Will get xrays of areas of pain.  Will follow xrays and let you know results when they are in.  Follow up date to be determined after xray review.
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
## 2017-04-12 ENCOUNTER — Ambulatory Visit: Payer: Self-pay | Admitting: Medical

## 2017-04-12 ENCOUNTER — Telehealth: Payer: Self-pay | Admitting: Medical

## 2017-04-12 DIAGNOSIS — M8580 Other specified disorders of bone density and structure, unspecified site: Secondary | ICD-10-CM

## 2017-04-12 NOTE — Telephone Encounter (Signed)
Future vitamin D placed.

## 2017-04-19 ENCOUNTER — Telehealth: Payer: Self-pay | Admitting: Medical

## 2017-04-19 NOTE — Telephone Encounter (Signed)
Pt called in. She said that she has had images completed on her back but says that she is still in pain, pt says that she is having lower back pain, she would like to be advised on what she should do next?    CB: [phone]

## 2017-04-24 ENCOUNTER — Telehealth: Payer: Self-pay | Admitting: Medical

## 2017-04-24 MED ORDER — TRAMADOL HCL 50 MG PO TABS: 50.0000 mg | ORAL_TABLET | Freq: Three times a day (TID) | ORAL | 0 refills | Status: DC | PRN
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
## 2017-04-24 NOTE — Telephone Encounter (Signed)
I printed the prescription of tramadol last night.  Please get that off the printer and help remind me to sign it.  Then will send prescription to patient's pharmacy.

## 2017-04-24 NOTE — Telephone Encounter (Signed)
Patient states she thinks she would rather go to Neurology and she is working on appointment for that now. States she does need refill om her Tramadol.
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
## 2017-04-25 NOTE — Telephone Encounter (Signed)
Faxed to pharmacy

## 2017-04-25 NOTE — Telephone Encounter (Signed)
Called patient states she has a friend who is Interior and spatial designerdirector of Orthopedic nursing that is trying to get her in to neurology.

## 2017-04-26 MED FILL — traMADol HCL 50 MG TABS: 50 | 30 days supply | Qty: 90 | Fill #0

## 2017-04-26 NOTE — Telephone Encounter (Signed)
Pharmacy did not receive rx. [facility] call [facility] and they can take care of it.  Pt want to pick up at pharmacy in [place].  [phone]

## 2017-04-29 NOTE — Telephone Encounter (Signed)
Faxed rx again 04/26/17

## 2017-05-16 ENCOUNTER — Ambulatory Visit (INDEPENDENT_AMBULATORY_CARE_PROVIDER_SITE_OTHER): Payer: 59 | Admitting: Medical

## 2017-05-16 ENCOUNTER — Encounter: Payer: Self-pay | Admitting: Medical

## 2017-05-16 VITALS — BP 124/49 | HR 81 | Temp 98.8°F | Resp 16 | Ht 63.0 in | Wt 163.8 lb

## 2017-05-16 DIAGNOSIS — M797 Fibromyalgia: Secondary | ICD-10-CM | POA: Diagnosis not present

## 2017-05-16 MED ORDER — MILNACIPRAN HCL 12.5 & 25 & 50 MG PO MISC: ORAL | 0 refills | Status: DC

## 2017-05-16 MED FILL — SAVELLA TITRATION PACK: 12.5 & 25 & | 30 days supply | Qty: 55 | Fill #0

## 2017-05-16 NOTE — Patient Instructions (Addendum)
For your history of fibromyalgia, I am going to prescribe Savella.  I want you to get the prescription downstairs.  Continue the gabapentin.  Regarding the use of tramadol there is precaution on potential serotonin syndrome.  So presently until I see you for follow-up in 2 weeks I want you to limit the use of tramadol to 1 tablet a day until I see how you are doing with the new medication.  Also providing some education material on serotonin syndrome.(Signs/symptoms were to occur then recommend ED evaluation)  Follow-up in 2-3 weeks or as needed.  Serotonin Syndrome Serotonin is a brain chemical that regulates the nervous system, which includes the brain, spinal cord, and nerves. Serotonin appears to play a role in all types of behavior, including appetite, emotions, movement, thinking, and response to stress. Excessively high levels of serotonin in the body can cause serotonin syndrome, which is a very dangerous condition. What are the causes? This condition can be caused by taking medicines or drugs that increase the level of serotonin in your body. These include:  Antidepressant medicines.  Migraine medicines.  Certain pain medicines.  Certain recreational drugs, including ecstasy, LSD, cocaine, and amphetamines.  Over-the-counter cough or cold medicines that contain dextromethorphan.  Certain herbal supplements, including St. John's wort, ginseng, and nutmeg.  This condition usually occurs when you take these medicines or drugs in combination, but it can also happen with a high dose of a single medicine or drug. What increases the risk? This condition is more likely to develop in:  People who have recently increased the dosage of medicine that increases the serotonin level.  People who just started taking medicine that increases the serotonin level.  What are the signs or symptoms? Symptoms of this condition usually happens within several hours of a medicine change. Symptoms  include:  Headache.  Muscle twitching or stiffness.  Diarrhea.  Confusion.  Restlessness or agitation.  Shivering or goose bumps.  Loss of muscle coordination.  Rapid heart rate.  Sweating.  Severe cases of serotonin syndromecan cause:  Irregular heartbeat.  Seizures.  Loss of consciousness.  High fever.  How is this diagnosed? This condition is diagnosed with a medical history and physical exam. You will be asked aboutyour symptoms and your use of medicines and recreational drugs. Your health care provider may also order lab work or additional tests to rule out other causes of your symptoms. How is this treated? The treatment for this condition depends on the severity of your symptoms. For mild cases, stopping the medicine that caused your condition is usually all that is needed. For moderate to severe cases, hospitalization is required to monitor you and to prevent further muscle damage. Follow these instructions at home:  Take over-the-counter and prescription medicines only as told by your health care provider. This is important.  Check with your health care provider before you start taking any new prescriptions, over-the-counter medicines, herbs, or supplements.  Avoid combining any medicines that can cause this condition to occur.  Keep all follow-up visits as told by your health care provider.This is important.  Maintain a healthy lifestyle. ? Eat healthy foods. ? Get plenty of sleep. ? Exercise regularly. ? Do not drink alcohol. ? Do not use recreational drugs. Contact a health care provider if:  Medicines do not seem to be helping.  Your symptoms do not improve or they get worse.  You have trouble taking care of yourself. Get help right away if:  You have worsening confusion, severe  headache, chest pain, high fever, seizures, or loss of consciousness.  You have serious thoughts about hurting yourself or others.  You experience serious side  effects of medicine, such as swelling of your face, lips, tongue, or throat. This information is not intended to replace advice given to you by your health care provider. Make sure you discuss any questions you have with your health care provider. Document Released: 04/26/2004 Document Revised: 11/12/2015 Document Reviewed: 04/01/2014 Elsevier Interactive Patient Education  [employer].

## 2017-05-16 NOTE — Progress Notes (Signed)
Subjective:    Patient ID: Joanna Anderson, female    DOB: Dec 23, 1965, 52 y.o.   MRN: [id number]  HPI   Pt in for follow up.  Pt has fibromyalgia. Pt still has diffuse muscle aches all over.   Pt in the past has used gabapentin and tramadol. She states when she wakes up in the morning with muscle and joint pains. She also use tylenol as well. Pt drinks coffee between 5 am-6 am. Stays in bed 2 hours before pain dissipates.  When she first wakes up the pain level is 7 out of 10.  2 hours later after starting medication pain level will be about 3-4 out of 10.  Lyrica was tried in past may have caused some behavioral changes.  If she takes tramadol late in evening right before she sleeps won't have much pain in the morning.   She states feels stiff in the morning.  Pt has started to work out.   Pt used to be on cymbalta in past. It seemed to help. She thought she had leg weakness but when she was evaluated at hospital she states they thought she had conversion disorder. This was years ago.  Pt wants to consider savella for fibromyalgia.    Review of Systems  Constitutional: Negative for fatigue and fever.  HENT: Negative for congestion and drooling.   Respiratory: Negative for cough, chest tightness, shortness of breath and wheezing.   Cardiovascular: Negative for chest pain and palpitations.  Gastrointestinal: Negative for abdominal pain.  Genitourinary: Negative for difficulty urinating and dysuria.  Musculoskeletal: Positive for myalgias.  Skin: Negative for rash.  Neurological: Negative for dizziness, syncope, speech difficulty, weakness, light-headedness and headaches.  Hematological: Negative for adenopathy. Does not bruise/bleed easily.  Psychiatric/Behavioral: Negative for behavioral problems, decreased concentration, self-injury and sleep disturbance. The patient is not nervous/anxious.     Past Medical History:  Diagnosis Date  . Anemia   . Anxiety   . ASD (atrial  septal defect) 06/11/2014   S/p patch repair  . Atherosclerosis of abdominal aorta (HCC) 06/11/2014   Age advanced atherosclerosis of the infrarenal aorta  . Depression   . Dysmenorrhea   . Dysuria   . Encounter for long-term (current) use of other medications   . Headache   . Hematuria   . High cholesterol   . Left knee pain   . Migraine   . Other malaise and fatigue   . Pure hyperglyceridemia   . Routine general medical examination at a health care facility   . Shoulder pain   . Vitamin D deficiency   . Vitamin D deficiency   . VSD (ventricular septal defect), multiple 06/11/2014   S/p patch repair     Social History   Socioeconomic History  . Marital status: Married    Spouse name: Not on file  . Number of children: 4  . Years of education: College  . Highest education level: Not on file  Social Needs  . Financial resource strain: Not on file  . Food insecurity - worry: Not on file  . Food insecurity - inability: Not on file  . Transportation needs - medical: Not on file  . Transportation needs - non-medical: Not on file  Occupational History  . 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 Smoking status: Never Smoker  . Smokeless tobacco: Never Used  Substance and Sexual Activity  . Alcohol use: No    Alcohol/week: 0.0 oz  .  Drug use: No  . Sexual activity: Not on file  Other Topics Concern  . Not on file  Social History Narrative   Lives at home with husband and children.   Right-handed.    Past Surgical History:  Procedure Laterality Date  . c seaction    . CESAREAN SECTION    . KNEE ARTHROSCOPY W/ ACL RECONSTRUCTION AND HAMSTRING GRAFT Left   . vetricular hole      Family History  Problem Relation Age of Onset  . Depression Mother   . High blood pressure Mother   . Hyperlipidemia Mother   . Anxiety disorder Mother   . Hypertension Mother   . High blood pressure Father   . Hyperlipidemia Father   . Hypertension Father   . Crohn's disease  Father   . Kidney disease Father   . Peripheral vascular disease Father   . Peripheral vascular disease Paternal Grandfather     Allergies  Allergen Reactions  . Banana Other (See Comments)    Headaches  . Nsaids Nausea Only and Other (See Comments)    Also caused stomach ulcers (can only tolerate in limited doses)  . Other Other (See Comments)    SSRI(s): Makes the patient feel "disconnected" Tricyclic antidepressants- Muscle spasms Sulfates- Itching  . Sulfa Antibiotics Hives    Current Outpatient Medications on File Prior to Visit  Medication Sig Dispense Refill  . acetaminophen (TYLENOL) 650 MG CR tablet Take 650 mg by mouth every 8 (eight) hours as needed for pain. Reported on 07/01/2015    . famciclovir (FAMVIR) 500 MG tablet Take 1 tablet (500 mg total) by mouth 3 (three) times daily. 21 tablet 0  . fluticasone (FLONASE) 50 MCG/ACT nasal spray Place 2 sprays into both nostrils daily. 16 g 1  . gabapentin (NEURONTIN) 300 MG capsule Take 1 capsule (300 mg total) by mouth 4 (four) times daily. 120 capsule 1  . methocarbamol (ROBAXIN) 500 MG tablet 1 tab po q hs 30 tablet 1  . ondansetron (ZOFRAN ODT) 4 MG disintegrating tablet Take 1 tablet (4 mg total) by mouth every 8 (eight) hours as needed for nausea or vomiting. 10 tablet 0  . polyethylene glycol (MIRALAX / GLYCOLAX) packet Take 17 g by mouth daily. 14 each 0  . promethazine (PHENERGAN) 12.5 MG tablet Take 1 tablet (12.5 mg total) by mouth every 8 (eight) hours as needed for nausea or vomiting. 6 tablet 0  . SUMAtriptan (IMITREX) 50 MG tablet One tab at onset of migraine - may repeat in 2 hrs if needed (max dose: 2 tabs/24 hrs or 15/month).  Please call [phone] to schedule appt for continued refills. 15 tablet 1  . traMADol (ULTRAM) 50 MG tablet Take 1 tablet (50 mg total) by mouth every 8 (eight) hours as needed for severe pain. 90 tablet 0   No current facility-administered medications on file prior to visit.     BP (!)  124/49   Pulse 81   Temp 98.8 F (37.1 C) (Oral)   Resp 16   Ht 5\' 3"  (1.6 m)   Wt 163 lb 12.8 oz (74.3 kg)   SpO2 97%   BMI 29.02 kg/m       Objective:   Physical Exam  General Mental Status- Alert. General Appearance- Not in acute distress.   Skin General: Color- Normal Color. Moisture- Normal Moisture.  Neck Carotid Arteries- Normal color. Moisture- Normal Moisture. No carotid bruits. No JVD.  Chest and Lung Exam Auscultation: Breath Sounds:-Normal.  Cardiovascular Auscultation:Rythm- Regular. Murmurs & Other Heart Sounds:Auscultation of the heart reveals- No Murmurs.  Abdomen Inspection:-Inspeection Normal. Palpation/Percussion:Note:No mass. Palpation and Percussion of the abdomen reveal- Non Tender, Non Distended + BS, no rebound or guarding.    Neurologic Cranial Nerve exam:- CN III-XII intact(No nystagmus), symmetric smile. Strength:- 5/5 equal and symmetric strength both upper and lower extremities.      Assessment & Plan:  For your history of fibromyalgia, I am going to prescribe Savella.  I want you to get the prescription downstairs.  Continue the gabapentin.  Regarding the use of tramadol there is precaution on potential serotonin syndrome.  So presently until I see you for follow-up in 2 weeks I want you to limit the use of tramadol to 1 tablet a day until I see how you are doing with the new medication.  Also providing some education material on serotonin syndrome.(If such signs/symptoms were to occur then recommend ED evaluation)  Follow-up in 2-3 weeks or as needed.

## 2017-05-31 MED FILL — GABAPENTIN 300 MG CAPSULE: 300 | 30 days supply | Qty: 120 | Fill #0

## 2017-06-03 ENCOUNTER — Encounter: Payer: Self-pay | Admitting: Medical

## 2017-06-03 ENCOUNTER — Ambulatory Visit (INDEPENDENT_AMBULATORY_CARE_PROVIDER_SITE_OTHER): Payer: 59 | Admitting: Medical

## 2017-06-03 VITALS — BP 106/48 | HR 89 | Temp 98.2°F | Resp 16 | Ht 63.0 in | Wt 163.8 lb

## 2017-06-03 DIAGNOSIS — M25551 Pain in right hip: Secondary | ICD-10-CM

## 2017-06-03 DIAGNOSIS — M797 Fibromyalgia: Secondary | ICD-10-CM

## 2017-06-03 MED ORDER — TRAMADOL HCL 50 MG PO TABS: 50.0000 mg | ORAL_TABLET | Freq: Two times a day (BID) | ORAL | 0 refills | Status: DC | PRN

## 2017-06-03 NOTE — Progress Notes (Signed)
Subjective:    Patient ID: Joanna Anderson, female    DOB: 03/31/1966, 52 y.o.   MRN: [id number]  HPI  Pt in for follow for fibromyalgia.   I had written Savella. She states first few days had some nausea. Some days she states seems to help a lot. Less pain mostly in the morning. But some confusion on body aches interpretation since one week had severe menstrual cycle and another week some viral syndrome symptoms.   3-4 days she felt good.  Pt has been on gabapentin, lyrica, tramadol and cymbalta. Nothing has really helped adequately.  On gabapentin and tramadol.  Added savella just recently  However,  Last 3 days seems not to be helping.  Pt has [facility] and got opinion on Tramadol use and Savella.  She describes that pharmacist was not all the concern about serotonin syndrome.    Review of Systems  Constitutional: Negative for chills, fatigue and fever.  Respiratory: Negative for cough, chest tightness, shortness of breath and wheezing.   Cardiovascular: Negative for chest pain and palpitations.  Gastrointestinal: Negative for abdominal distention, abdominal pain, anal bleeding, blood in stool, constipation, rectal pain and vomiting.  Musculoskeletal: Negative for arthralgias, back pain, joint swelling, neck pain and neck stiffness.       Diffuse body aches. All over.  Some recent more prominent right hip pain.  Skin: Negative for pallor and rash.  Neurological: Negative for dizziness, seizures, facial asymmetry, speech difficulty, weakness, light-headedness and headaches.  Hematological: Negative for adenopathy. Does not bruise/bleed easily.  Psychiatric/Behavioral: Negative for behavioral problems and confusion.    Past Medical History:  Diagnosis Date  . Anemia   . Anxiety   . ASD (atrial septal defect) 06/11/2014   S/p patch repair  . Atherosclerosis of abdominal aorta (HCC) 06/11/2014   Age advanced atherosclerosis of the infrarenal aorta  .  Depression   . Dysmenorrhea   . Dysuria   . Encounter for long-term (current) use of other medications   . Headache   . Hematuria   . High cholesterol   . Left knee pain   . Migraine   . Other malaise and fatigue   . Pure hyperglyceridemia   . Routine general medical examination at a health care facility   . Shoulder pain   . Vitamin D deficiency   . Vitamin D deficiency   . VSD (ventricular septal defect), multiple 06/11/2014   S/p patch repair     Social History   Socioeconomic History  . Marital status: Married    Spouse name: Not on file  . Number of children: 4  . Years of education: College  . Highest education level: Not on file  Social Needs  . Financial resource strain: Not on file  . Food insecurity - worry: Not on file  . Food insecurity - inability: Not on file  . Transportation needs - medical: Not on file  . Transportation needs - non-medical: Not on file  Occupational History  . 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 Smoking status: Never Smoker  . Smokeless tobacco: Never Used  Substance and Sexual Activity  . Alcohol use: No    Alcohol/week: 0.0 oz  . Drug use: No  . Sexual activity: Not on file  Other Topics Concern  . Not on file  Social History Narrative   Lives at home with husband and children.   Right-handed.    Past Surgical History:  Procedure Laterality Date  . c seaction    . CESAREAN SECTION    . KNEE ARTHROSCOPY W/ ACL RECONSTRUCTION AND HAMSTRING GRAFT Left   . vetricular hole      Family History  Problem Relation Age of Onset  . Depression Mother   . High blood pressure Mother   . Hyperlipidemia Mother   . Anxiety disorder Mother   . Hypertension Mother   . High blood pressure Father   . Hyperlipidemia Father   . Hypertension Father   . Crohn's disease Father   . Kidney disease Father   . Peripheral vascular disease Father   . Peripheral vascular disease Paternal Grandfather     Allergies  Allergen  Reactions  . Banana Other (See Comments)    Headaches  . Nsaids Nausea Only and Other (See Comments)    Also caused stomach ulcers (can only tolerate in limited doses)  . Other Other (See Comments)    SSRI(s): Makes the patient feel "disconnected" Tricyclic antidepressants- Muscle spasms Sulfates- Itching  . Sulfa Antibiotics Hives    Current Outpatient Medications on File Prior to Visit  Medication Sig Dispense Refill  . acetaminophen (TYLENOL) 650 MG CR tablet Take 650 mg by mouth every 8 (eight) hours as needed for pain. Reported on 07/01/2015    . famciclovir (FAMVIR) 500 MG tablet Take 1 tablet (500 mg total) by mouth 3 (three) times daily. 21 tablet 0  . fluticasone (FLONASE) 50 MCG/ACT nasal spray Place 2 sprays into both nostrils daily. 16 g 1  . gabapentin (NEURONTIN) 300 MG capsule Take 1 capsule (300 mg total) by mouth 4 (four) times daily. 120 capsule 1  . methocarbamol (ROBAXIN) 500 MG tablet 1 tab po q hs 30 tablet 1  . Milnacipran HCl (SAVELLA TITRATION PACK) 12.5 & 25 & 50 MG MISC 12.5 mg p.o. day 1, then 12.5 mg twice daily day 2 and day 3, then 25 mg twice daily day 4- day 7, then 50 mg twice daily 51 each 0  . ondansetron (ZOFRAN ODT) 4 MG disintegrating tablet Take 1 tablet (4 mg total) by mouth every 8 (eight) hours as needed for nausea or vomiting. 10 tablet 0  . polyethylene glycol (MIRALAX / GLYCOLAX) packet Take 17 g by mouth daily. 14 each 0  . promethazine (PHENERGAN) 12.5 MG tablet Take 1 tablet (12.5 mg total) by mouth every 8 (eight) hours as needed for nausea or vomiting. 6 tablet 0  . SUMAtriptan (IMITREX) 50 MG tablet One tab at onset of migraine - may repeat in 2 hrs if needed (max dose: 2 tabs/24 hrs or 15/month).  Please call [phone] to schedule appt for continued refills. 15 tablet 1  . traMADol (ULTRAM) 50 MG tablet Take 1 tablet (50 mg total) by mouth every 8 (eight) hours as needed for severe pain. 90 tablet 0   No current facility-administered  medications on file prior to visit.     BP (!) 106/48   Pulse 89   Temp 98.2 F (36.8 C) (Oral)   Resp 16   Ht 5\' 3"  (1.6 m)   Wt 163 lb 12.8 oz (74.3 kg)   SpO2 97%   BMI 29.02 kg/m       Objective:   Physical Exam  General Mental Status- Alert. General Appearance- Not in acute distress.   Skin General: Color- Normal Color. Moisture- Normal Moisture.  Neck Carotid Arteries- Normal color. Moisture- Normal Moisture. No carotid bruits. No JVD.  Chest and Lung  Exam Auscultation: Breath Sounds:-Normal.  Cardiovascular Auscultation:Rythm- Regular. Murmurs & Other Heart Sounds:Auscultation of the heart reveals- No Murmurs.  Abdomen Inspection:-Inspeection Normal. Palpation/Percussion:Note:No mass. Palpation and Percussion of the abdomen reveal- Non Tender, Non Distended + BS, no rebound or guarding.    Neurologic Cranial Nerve exam:- CN III-XII intact(No nystagmus), symmetric smile. Strength:- 5/5 equal and symmetric strength both upper and lower extremities.  Right hip-mild pain on range of motion.  Mild pain on palpation of lateral aspect hip itself.  Trochanter region.     Assessment & Plan:  Currently, I want you to continue with gabapentin and  Savella. I did discuss with Dr. Abner Greenspan and will rx tramadol twice daily.   Also could refer to pain management, they might come up with regimen. Some might used dry needling/accupuncture.  Follow up in one month or as needed  Offered xray of rt hip. She can get later date. Today declined.  Note also have extensive we discussed in the past symptoms serotonin syndrome and have given copies of information patient as well.

## 2017-06-03 NOTE — Patient Instructions (Addendum)
Currently for fibromyalgia, I want you to continue with gabapentin and  Savella. I did discuss with Dr. Abner GreenspanBlyth and will tx tramadol bid.   Also could refer to pain management, they might come up with regimen. Some might used dry needling/accupuncture.  Follow up in one month or as needed

## 2017-06-05 MED FILL — traMADol HCL 50 MG TABS: 50 | 30 days supply | Qty: 60 | Fill #0

## 2017-06-06 ENCOUNTER — Telehealth: Payer: Self-pay | Admitting: Medical

## 2017-06-06 NOTE — Telephone Encounter (Signed)
Joanna Anderson, Joanna Anderson  Joanna Wolters, PA-C        Hey there,  We are a [facility] Re[facility] Facility but do not provide dry needling or acupuncture. [facility] do offer the dry needling treatment at most of their locations.  [facility] [address] [phone]  [facility] [place] [phone]  [facility] [place] [phone]  [facility] [phone]  [facility] [place] [phone]   These locations, I know have dry needling therapy specialist. Hope this is helpful.  Thanks,  Joanna HazelLisa Miller  Referral Coordinator  [facility]    Can you refer her to one of these locations.

## 2017-06-12 ENCOUNTER — Other Ambulatory Visit: Payer: Self-pay | Admitting: Medical

## 2017-06-12 ENCOUNTER — Encounter: Payer: Self-pay | Admitting: Medical

## 2017-06-12 ENCOUNTER — Telehealth: Payer: Self-pay | Admitting: Medical

## 2017-06-12 NOTE — Telephone Encounter (Signed)
Will you look at pain management referral. Please see info on locations that do dry needling and accupuncture. Refer to one of those locations please.   Please get that in process. Looks like first location we tried did not do either. Would you call and clarify that they do accupunture and dry needling.

## 2017-06-13 ENCOUNTER — Telehealth: Payer: Self-pay | Admitting: Medical

## 2017-06-13 MED ORDER — MILNACIPRAN HCL 12.5 & 25 & 50 MG PO MISC: ORAL | 1 refills | Status: DC

## 2017-06-13 NOTE — Telephone Encounter (Signed)
Copied from CRM #[id number]. Topic: Quick [id number] - Rx Refill/Question >> Jun 13, 2017  5:19 PM Alexander BergeronBarksdale, Harvey B wrote: Medication: Milnacipran 50mg  tablets  Pharmacist states pt does not need the pack  Has the patient contacted their pharmacy? Yes.     (Agent: If no, request that the patient contact the pharmacy for the refill.)   Preferred Pharmacy (with phone number or street name): [facility]   Agent: Please be advised that RX refills may take up to 3 business days. We ask that you follow-up with your pharmacy.

## 2017-06-13 NOTE — Telephone Encounter (Unsigned)
Copied from CRM [id number]. Topic: Quick Communication - Rx Refill/Question >> Jun 13, 2017 12:06 PM Floria RavelingStovall, Shana A wrote: Medication: Milnacipran HCl (SAVELLA TITRATION PACK) 12.5 & 25 & 50 MG MISC [[id number]][[id number]]    Has the patient contacted their pharmacy? no   (Agent: If no, request that the patient contact the pharmacy for the refill.)   Preferred Pharmacy (with phone number or street name): [facility] - pt is needing this today or she stated she will be sick in the bed for 5 days. She wants to pick it up today.     Agent: Please be advised that RX refills may take up to 3 business days. We ask that you follow-up with your pharmacy.

## 2017-06-14 ENCOUNTER — Telehealth: Payer: Self-pay | Admitting: Medical

## 2017-06-14 ENCOUNTER — Encounter: Payer: Self-pay | Admitting: Medical

## 2017-06-14 MED ORDER — MILNACIPRAN HCL 50 MG PO TABS: 50.0000 mg | ORAL_TABLET | Freq: Two times a day (BID) | ORAL | 2 refills | Status: DC

## 2017-06-14 MED FILL — SAVELLA 50 MG TABLET: 50 | 30 days supply | Qty: 60 | Fill #0

## 2017-06-14 NOTE — Telephone Encounter (Signed)
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

## 2017-06-14 NOTE — Telephone Encounter (Signed)
Open to review prescription sent to pharmacy.  Appeared that Rx of patient's fibromyalgia medication was sent earlier in the day.

## 2017-06-14 NOTE — Telephone Encounter (Signed)
Rx savella sent to pt pharmacy

## 2017-06-14 NOTE — Telephone Encounter (Signed)
I already sent the prescription to her pharmacy.  I sent it to [facility] pharmacy earlier today.  Please notify patient of this.  I sent her a my chart message earlier advising refill was done.  Have her call the pharmacy ASAP since it is Friday and do not want pharmacy to close before she gets that filled.

## 2017-06-14 NOTE — Telephone Encounter (Signed)
Left patient detailed message regarding rx has been sent to the pharmacy for her. Also sent patient mychart message.

## 2017-06-16 ENCOUNTER — Encounter: Payer: Self-pay | Admitting: Medical

## 2017-06-17 ENCOUNTER — Encounter: Payer: Self-pay | Admitting: Medical

## 2017-06-17 ENCOUNTER — Telehealth: Payer: Self-pay | Admitting: Medical

## 2017-06-17 DIAGNOSIS — F419 Anxiety disorder, unspecified: Secondary | ICD-10-CM

## 2017-06-17 MED ORDER — CLONAZEPAM 0.5 MG PO TABS: 0.5000 mg | ORAL_TABLET | Freq: Two times a day (BID) | ORAL | 0 refills | Status: DC | PRN

## 2017-06-17 NOTE — Telephone Encounter (Signed)
Pt has anxiety and she sent me a my chart message stating that she needed a refill of clonazepam.  That her know I did send in a small prescription of 15 tablets to her pharmacy.  Please get the name of the pharmacy she wants me to send the prescription to.  I would need an update in about a week to make sure that she is not having any side effects from the medication.  Explained to her that  I would not recommend she take it before going to work.  Just use it at home during the week or on the weekends.  I could refill/give more tabs when I get confirmation she does well with the medication.  Also can you get the list of psychiatrist from somebody.  Patient would need to initiate referral herself to the psychiatrist.  That may take about 3-4 months for her to be seen.  Hopefully not that long but sometimes there is quite a wait.  You would give her the names and numbers of at least 4-5 psychiatrist.

## 2017-06-17 NOTE — Telephone Encounter (Signed)
Rx clonzepam sent to pt pharmacy.

## 2017-06-18 MED FILL — clonazePAM 0.5 MG TABS: 0.5 | 8 days supply | Qty: 15 | Fill #0

## 2017-06-19 NOTE — Telephone Encounter (Signed)
Left message to return call. Ok for [employer] to discuss. Please explain to patient she will need to call her insurance on the back of her card to find out which psychiatrist are in her network. Please also ask patient which pharmacy she wanted the rx to go to.

## 2017-06-27 ENCOUNTER — Encounter: Payer: Self-pay | Admitting: Medical

## 2017-06-27 ENCOUNTER — Telehealth: Payer: Self-pay | Admitting: Medical

## 2017-06-27 MED ORDER — CLONAZEPAM 0.5 MG PO TABS: 0.5000 mg | ORAL_TABLET | Freq: Two times a day (BID) | ORAL | 0 refills | Status: DC | PRN

## 2017-06-27 NOTE — Telephone Encounter (Signed)
Prescription of clonazepam sent to patient's pharmacy.  See my chart correspondence with patient.

## 2017-07-02 ENCOUNTER — Encounter: Payer: Self-pay | Admitting: Medical

## 2017-07-02 ENCOUNTER — Other Ambulatory Visit: Payer: Self-pay | Admitting: Medical

## 2017-07-03 ENCOUNTER — Telehealth: Payer: Self-pay | Admitting: Medical

## 2017-07-03 MED ORDER — TRAMADOL HCL 50 MG PO TABS: 50.0000 mg | ORAL_TABLET | Freq: Three times a day (TID) | ORAL | 0 refills | Status: DC | PRN

## 2017-07-03 NOTE — Telephone Encounter (Signed)
Patient states she needs to go back to taking medication tid. Please advise.

## 2017-07-03 NOTE — Telephone Encounter (Signed)
Prescription of tramadol sent to patient's pharmacy.  She did ask for increase of her tramadol to 3 times daily.  I had talked with Dr. Abner GreenspanBlyth and we discussed twice daily usage.  Dr. Abner GreenspanBlyth expressed that she was not concerned about serotonin syndrome with twice daily usage.  I am going to send patient a my chart message notifying of increasing to 3 times daily.  But also asked her to discuss the this with pharmacist downstairs.  Either with the Romeo [facility]  or Pam.

## 2017-07-03 NOTE — Telephone Encounter (Signed)
Open to review and write prescription of tramadol.

## 2017-07-03 NOTE — Telephone Encounter (Signed)
Pt called to check on Rx status, pt states she needs the medication by Friday, call pt to advise, as well in another T.E.  Pt states she would like to increase the dosage

## 2017-07-04 MED FILL — traMADol HCL 50 MG TABS: 50 | 30 days supply | Qty: 90 | Fill #0

## 2017-07-04 MED FILL — clonazePAM 0.5 MG TABS: 0.5 | 8 days supply | Qty: 15 | Fill #0

## 2017-07-12 ENCOUNTER — Other Ambulatory Visit: Payer: Self-pay | Admitting: Medical

## 2017-07-12 MED FILL — SAVELLA 50 MG TABLET: 50 | 30 days supply | Qty: 60 | Fill #1 | Status: TO

## 2017-07-12 MED FILL — GABAPENTIN 300 MG CAPSULE: 300 | 30 days supply | Qty: 120 | Fill #0

## 2017-07-20 ENCOUNTER — Encounter (HOSPITAL_COMMUNITY): Payer: Self-pay | Admitting: *Deleted

## 2017-07-20 ENCOUNTER — Other Ambulatory Visit: Payer: Self-pay

## 2017-07-20 ENCOUNTER — Ambulatory Visit (HOSPITAL_COMMUNITY)
Admission: EM | Admit: 2017-07-20 | Discharge: 2017-07-20 | Disposition: A | Discharge status: Home / Self Care | Payer: 59 | Attending: Family Medicine | Admitting: Family Medicine

## 2017-07-20 DIAGNOSIS — J181 Lobar pneumonia, unspecified organism: Secondary | ICD-10-CM | POA: Diagnosis not present

## 2017-07-20 DIAGNOSIS — J189 Pneumonia, unspecified organism: Secondary | ICD-10-CM

## 2017-07-20 HISTORY — DX: Fibromyalgia: M79.7

## 2017-07-20 MED ORDER — ALBUTEROL SULFATE 108 (90 BASE) MCG/ACT IN AEPB: 1.0000 | INHALATION_SPRAY | Freq: Four times a day (QID) | RESPIRATORY_TRACT | 0 refills | Status: AC

## 2017-07-20 MED ORDER — BENZONATATE 200 MG PO CAPS: 200.0000 mg | ORAL_CAPSULE | Freq: Three times a day (TID) | ORAL | 0 refills | Status: AC

## 2017-07-20 MED ORDER — PREDNISONE 50 MG PO TABS: 50.0000 mg | ORAL_TABLET | Freq: Every day | ORAL | 0 refills | Status: AC

## 2017-07-20 MED ORDER — AZITHROMYCIN 250 MG PO TABS: 250.0000 mg | ORAL_TABLET | Freq: Every day | ORAL | 0 refills | Status: AC

## 2017-07-20 NOTE — ED Triage Notes (Signed)
Reports starting with chest cold 1 wk ago and "I'm not getting over it".  This afternoon c/o feeling SOB with tightness around lower ribcage.  C/O hearing noises in left lower lung area.  C/O fevers off/on over past week.

## 2017-07-20 NOTE — Discharge Instructions (Addendum)
Please begin azithromycin - 2 tablets today, 1 tablet the following 4 days  Prednisone daily for 5 days  Albuterol inhaler as needed for shortness of breath/rattling.   Begin daily claritin and continue mucinex

## 2017-07-21 NOTE — ED Provider Notes (Signed)
[facility]    CSN: [id number] Arrival date & time: 07/20/17  1650     History   Chief Complaint Chief Complaint  Patient presents with  . Shortness of Breath    HPI Joanna Anderson is a 52 y.o. female presenting today for evaluation of cough and shortness of breath.  States that over the past week she has had a chest cold, denies congestion or rhinorrhea.  She has had some slight sore throat in relation to the cough.  She feels like she has had difficulty taking a full deep breath and is feeling noises and tightness in her left lower rib cage area.  She states that she has had fevers off and on.  She has tried Mucinex and a cough suppressant for 3 days without relief.  She denies any nausea or vomiting, abdominal pain.  HPI  Past Medical History:  Diagnosis Date  . Anemia   . Anxiety   . ASD (atrial septal defect) 06/11/2014   S/p patch repair  . Atherosclerosis of abdominal aorta (HCC) 06/11/2014   Age advanced atherosclerosis of the infrarenal aorta  . Depression   . Dysmenorrhea   . Dysuria   . Encounter for long-term (current) use of other medications   . Fibromyalgia   . Headache   . Hematuria   . High cholesterol   . Left knee pain   . Migraine   . Other malaise and fatigue   . Pure hyperglyceridemia   . Routine general medical examination at a health care facility   . Shoulder pain   . Vitamin D deficiency   . Vitamin D deficiency   . VSD (ventricular septal defect), multiple 06/11/2014   S/p patch repair    Patient Active Problem List   Diagnosis Date Noted  . Recurrent fever 12/26/2016  . Muscle pain 04/19/2015  . Neck pain 02/22/2015  . Abnormality of gait 02/22/2015  . Pelvic pain in female 10/02/2014  . Chest pain 06/11/2014  . ASD (atrial septal defect) 06/11/2014  . VSD (ventricular septal defect), multiple 06/11/2014  . Atherosclerosis of abdominal aorta (HCC) 06/11/2014  . Other malaise and fatigue   . Shoulder pain   .  Dysmenorrhea   . Dysuria   . Hematuria   . Anxiety   . Encounter for long-term (current) use of other medications   . Vitamin D deficiency   . Pure hyperglyceridemia   . Migraine   . Depression     Past Surgical History:  Procedure Laterality Date  . c seaction    . CARDIAC SURGERY     as child  . CESAREAN SECTION    . KNEE ARTHROSCOPY W/ ACL RECONSTRUCTION AND HAMSTRING GRAFT Left   . vetricular hole      OB History   None      Home Medications    Prior to Admission medications   Medication Sig Start Date End Date Taking? Authorizing Provider  clonazePAM (KLONOPIN) 0.5 MG tablet Take 1 tablet (0.5 mg total) by mouth 2 (two) times daily as needed for anxiety. 06/27/17  Yes Saguier, Ramon Dredge, PA-C  gabapentin (NEURONTIN) 300 MG capsule TAKE 1 CAPSULE (300 MG TOTAL) BY MOUTH 4 (FOUR) TIMES DAILY. 07/12/17  Yes Saguier, Ramon Dredge, PA-C  methocarbamol (ROBAXIN) 500 MG tablet 1 tab po q hs 04/11/17  Yes Saguier, Ramon Dredge, PA-C  Milnacipran (SAVELLA) 50 MG TABS tablet Take 1 tablet (50 mg total) by mouth 2 (two) times daily. 06/14/17  Yes Saguier, Ramon Dredge,  PA-C  SUMAtriptan (IMITREX) 50 MG tablet One tab at onset of migraine - may repeat in 2 hrs if needed (max dose: 2 tabs/24 hrs or 15/month).  Please call [phone] to schedule appt for continued refills. 07/31/16  Yes Levert FeinsteinYan, Yijun, MD  traMADol (ULTRAM) 50 MG tablet Take 1 tablet (50 mg total) by mouth every 8 (eight) hours as needed for severe pain. 07/03/17  Yes Saguier, Ramon DredgeEdward, PA-C  acetaminophen (TYLENOL) 650 MG CR tablet Take 650 mg by mouth every 8 (eight) hours as needed for pain. Reported on 07/01/2015    [provider]  Albuterol Sulfate (PROAIR RESPICLICK) 108 (90 Base) MCG/ACT AEPB Inhale 1-2 puffs into the lungs every 6 (six) hours. 07/20/17   Anaisa Radi C, PA-C  azithromycin (ZITHROMAX) 250 MG tablet Take 1 tablet (250 mg total) by mouth daily for 5 days. Take first 2 tablets together, then 1 every day until finished.  07/20/17 07/25/17  Syvilla Martin C, PA-C  benzonatate (TESSALON) 200 MG capsule Take 1 capsule (200 mg total) by mouth every 8 (eight) hours for 7 days. 07/20/17 07/27/17  Rainer Mounce C, PA-C  famciclovir (FAMVIR) 500 MG tablet Take 1 tablet (500 mg total) by mouth 3 (three) times daily. 06/04/16   Saguier, Ramon DredgeEdward, PA-C  fluticasone (FLONASE) 50 MCG/ACT nasal spray Place 2 sprays into both nostrils daily. 11/16/16   Saguier, Ramon DredgeEdward, PA-C  ondansetron (ZOFRAN ODT) 4 MG disintegrating tablet Take 1 tablet (4 mg total) by mouth every 8 (eight) hours as needed for nausea or vomiting. 09/24/16   Delynn FlavinGottschalk, Ashly M, DO  polyethylene glycol (MIRALAX / GLYCOLAX) packet Take 17 g by mouth daily. 07/23/16   Saguier, Ramon DredgeEdward, PA-C  predniSONE (DELTASONE) 50 MG tablet Take 1 tablet (50 mg total) by mouth daily for 5 days. 07/20/17 07/25/17  Kaylamarie Swickard C, PA-C  promethazine (PHENERGAN) 12.5 MG tablet Take 1 tablet (12.5 mg total) by mouth every 8 (eight) hours as needed for nausea or vomiting. 10/04/16   Saguier, Ramon DredgeEdward, PA-C    Family History Family History  Problem Relation Age of Onset  . Depression Mother   . High blood pressure Mother   . Hyperlipidemia Mother   . Anxiety disorder Mother   . Hypertension Mother   . High blood pressure Father   . Hyperlipidemia Father   . Hypertension Father   . Crohn's disease Father   . Kidney disease Father   . Peripheral vascular disease Father   . Peripheral vascular disease Paternal Grandfather     Social History Social History   Tobacco Use  . Smoking status: Never Smoker  . Smokeless tobacco: Never Used  Substance Use Topics  . Alcohol use: No    Alcohol/week: 0.0 oz  . Drug use: No     Allergies   Banana; Nsaids; Other; and Sulfa antibiotics   Review of Systems Review of Systems  Constitutional: Positive for chills and fever. Negative for fatigue.  HENT: Positive for congestion. Negative for ear pain, rhinorrhea, sinus pressure, sore  throat and trouble swallowing.   Respiratory: Positive for cough, chest tightness, shortness of breath and wheezing.   Cardiovascular: Negative for chest pain.  Gastrointestinal: Negative for abdominal pain, nausea and vomiting.  Musculoskeletal: Negative for myalgias.  Skin: Negative for rash.  Neurological: Negative for dizziness, light-headedness and headaches.     Physical Exam Triage Vital Signs ED Triage Vitals  Enc Vitals Group     BP 07/20/17 1701 (!) 121/49     Pulse Rate  07/20/17 1700 88     Resp 07/20/17 1700 16     Temp 07/20/17 1700 98.3 F (36.8 C)     Temp Source 07/20/17 1700 Oral     SpO2 07/20/17 1700 99 %     Weight --      Height --      Head Circumference --      Peak Flow --      Pain Score 07/20/17 1700 0     Pain Loc --      Pain Edu? --      Excl. in GC? --    No data found.  Updated Vital Signs BP (!) 121/49   Pulse 88   Temp 98.3 F (36.8 C) (Oral)   Resp 16   LMP 06/19/2017 (Approximate)   SpO2 99%   Visual Acuity Right Eye Distance:   Left Eye Distance:   Bilateral Distance:    Right Eye Near:   Left Eye Near:    Bilateral Near:     Physical Exam  Constitutional: She appears well-developed and well-nourished. No distress.  HENT:  Head: Normocephalic and atraumatic.  Bilateral TMs nonerythematous, nasal mucosa nonerythematous without rhinorrhea, posterior oropharynx erythematous, no tonsillar enlargement or exudate.  Eyes: Conjunctivae are normal.  Neck: Neck supple.  Cardiovascular: Normal rate and regular rhythm.  No murmur heard. Pulmonary/Chest: Effort normal and breath sounds normal. No respiratory distress.  Patient breathing comfortably at rest, frequent quick coughing in room.  Lungs without any wheezing, slight decrease in lung sounds to left lower area.  Abdominal: Soft. There is no tenderness.  Musculoskeletal: She exhibits no edema.  Neurological: She is alert.  Skin: Skin is warm and dry.  Psychiatric: She has a  normal mood and affect.  Nursing note and vitals reviewed.    UC Treatments / Results  Labs (all labs ordered are listed, but only abnormal results are displayed) Labs Reviewed - No data to display  EKG None Radiology No results found.  Procedures Procedures (including critical care time)  Medications Ordered in UC Medications - No data to display   Initial Impression / Assessment and Plan / UC Course  I have reviewed the triage vital signs and the nursing notes.  Pertinent labs & imaging results that were available during my care of the patient were reviewed by me and considered in my medical decision making (see chart for details).     Patient with a cough for over a week with difficulty taking a full deep breath.  Will treat with azithromycin, albuterol inhaler for shortness of breath as well as Tessalon for cough. Discussed strict return precautions. Patient verbalized understanding and is agreeable with plan.   Final Clinical Impressions(s) / UC Diagnoses   Final diagnoses:  Community acquired pneumonia of left lower lobe of lung [facility])    ED Discharge Orders        Ordered    azithromycin (ZITHROMAX) 250 MG tablet  Daily     07/20/17 1813    predniSONE (DELTASONE) 50 MG tablet  Daily     07/20/17 1813    Albuterol Sulfate (PROAIR RESPICLICK) 108 (90 Base) MCG/ACT AEPB  Every 6 hours     07/20/17 1813    benzonatate (TESSALON) 200 MG capsule  Every 8 hours     07/20/17 1813       Controlled Substance Prescriptions [place] Controlled Substance Registry consulted? Not Applicable   Lew Dawes, [place] 07/21/17 1015

## 2017-07-23 ENCOUNTER — Encounter: Payer: Self-pay | Admitting: Medical

## 2017-07-25 ENCOUNTER — Telehealth: Payer: Self-pay | Admitting: Medical

## 2017-07-25 NOTE — Telephone Encounter (Signed)
Patient states that she has had FMLA paperwork sent for us to fill out.  This was just filled out about 6 months ago.  If you could pull that old form and use that as a guide to fill out the new form.  If we have that form she is giving us about a week deadline stating that before the end of the month she has to have that filled out.  Let me know if we have it.  Thanks

## 2017-07-30 ENCOUNTER — Other Ambulatory Visit: Payer: Self-pay

## 2017-07-30 ENCOUNTER — Encounter: Payer: Self-pay | Admitting: Medical

## 2017-07-30 ENCOUNTER — Ambulatory Visit (INDEPENDENT_AMBULATORY_CARE_PROVIDER_SITE_OTHER): Payer: 59 | Admitting: Medical

## 2017-07-30 VITALS — BP 107/60 | HR 96 | Resp 16 | Ht 63.0 in | Wt 158.0 lb

## 2017-07-30 DIAGNOSIS — M797 Fibromyalgia: Secondary | ICD-10-CM | POA: Diagnosis not present

## 2017-07-30 DIAGNOSIS — F419 Anxiety disorder, unspecified: Secondary | ICD-10-CM | POA: Diagnosis not present

## 2017-07-30 DIAGNOSIS — H5213 Myopia, bilateral: Secondary | ICD-10-CM | POA: Diagnosis not present

## 2017-07-30 MED ORDER — CLONAZEPAM 0.5 MG PO TABS: 0.5000 mg | ORAL_TABLET | Freq: Two times a day (BID) | ORAL | 1 refills | Status: DC | PRN

## 2017-07-30 MED ORDER — SUMATRIPTAN SUCCINATE 50 MG PO TABS: ORAL_TABLET | ORAL | 1 refills | Status: DC

## 2017-07-30 MED ORDER — GABAPENTIN 300 MG PO CAPS: 300.0000 mg | ORAL_CAPSULE | Freq: Four times a day (QID) | ORAL | 0 refills | Status: DC

## 2017-07-30 MED FILL — clonazePAM 0.5 MG TABS: 0.5 | 30 days supply | Qty: 60 | Fill #0 | Status: TO

## 2017-07-30 MED FILL — SUMATRIPTAN SUCC 50 MG TAB: 50 | 30 days supply | Qty: 15 | Fill #0 | Status: TO

## 2017-07-30 NOTE — Patient Instructions (Addendum)
With your recent high level stress and unemployment, I am going to give you higher volume of clonazepam to use twice daily if needed.  You had recent panic attacks and if you find your  self in situation like this would like you to have medication to abort the attack.  However please do use the medication sparingly/when needed.  For history of fibromyalgia, would continue the gabapentin and tramadol as prescribed.  We will refill those medications when you are due.  I would recommend that you continue the [facility] as long as possible.  We discussed how you could taper off if the medication was too expensive.  However would ask that you talk with the pharmacist about what the cost would be paying cash.  Also asked if there are any coupons that you could use.   Follow-up in 1 month or as needed.

## 2017-07-30 NOTE — Progress Notes (Signed)
Subjective:    Patient ID: Joanna Anderson, female    DOB: Aug 27, 1965, 52 y.o.   MRN: [id number]  HPI  Pt in states she has been stressed and anxious over recent unemployment/dismissal.   Pt states a patient complained about her and she was let go.  Pt states she feels really stressed about her unemployment. She is feeling stressed and troubled. Not eating much at all.    She states feeling panicky and very anxious at times. She recently sold her car in anticipation of her unemployment.   Today is her last day of health insurance. Pt thinks she won't be able to afford Savella. So she wants to  consider taper off med.  Pt has some upcoming interviews.     Review of Systems  Constitutional: Negative for fatigue and fever.  Respiratory: Negative for cough, chest tightness, shortness of breath and wheezing.   Gastrointestinal: Negative for abdominal distention and abdominal pain.  Musculoskeletal: Positive for myalgias. Negative for arthralgias, back pain and neck stiffness.  Skin: Negative for rash.  Neurological: Negative for dizziness and headaches.  Hematological: Negative for adenopathy. Does not bruise/bleed easily.  Psychiatric/Behavioral: Negative for behavioral problems, confusion, dysphoric mood and suicidal ideas. The patient is nervous/anxious.    Past Medical History:  Diagnosis Date  . Anemia   . Anxiety   . ASD (atrial septal defect) 06/11/2014   S/p patch repair  . Atherosclerosis of abdominal aorta (HCC) 06/11/2014   Age advanced atherosclerosis of the infrarenal aorta  . Depression   . Dysmenorrhea   . Dysuria   . Encounter for long-term (current) use of other medications   . Fibromyalgia   . Headache   . Hematuria   . High cholesterol   . Left knee pain   . Migraine   . Other malaise and fatigue   . Pure hyperglyceridemia   . Routine general medical examination at a health care facility   . Shoulder pain   . Vitamin D deficiency   . Vitamin D  deficiency   . VSD (ventricular septal defect), multiple 06/11/2014   S/p patch repair     Social History   Socioeconomic History  . Marital status: Married    Spouse name: Not on file  . Number of children: 4  . Years of education: College  . Highest education level: Not on file  Occupational History  . Occupation: Charity fundraiser    Comment: [employer]  Social Needs  . Financial resource strain: Not on file  . Food insecurity:    Worry: Not on file    Inability: Not on file  . Transportation needs:    Medical: Not on file    Non-medical: Not on file  Tobacco Use  . Smoking status: Never Smoker  . Smokeless tobacco: Never Used  Substance and Sexual Activity  . Alcohol use: No    Alcohol/week: 0.0 oz  . Drug use: No  . Sexual activity: Not on file  Lifestyle  . Physical activity:    Days per week: Not on file    Minutes per session: Not on file  . Stress: Not on file  Relationships  . Social connections:    Talks on phone: Not on file    Gets together: Not on file    Attends religious service: Not on file    Active member of club or organization: Not on file    Attends meetings of clubs or organizations: Not on file    Relationship  status: Not on file  . Intimate partner violence:    Fear of current or ex partner: Not on file    Emotionally abused: Not on file    Physically abused: Not on file    Forced sexual activity: Not on file  Other Topics Concern  . Not on file  Social History Narrative   Lives at home with husband and children.   Right-handed.    Past Surgical History:  Procedure Laterality Date  . c seaction    . CARDIAC SURGERY     as child  . CESAREAN SECTION    . KNEE ARTHROSCOPY W/ ACL RECONSTRUCTION AND HAMSTRING GRAFT Left   . vetricular hole      Family History  Problem Relation Age of Onset  . Depression Mother   . High blood pressure Mother   . Hyperlipidemia Mother   . Anxiety disorder Mother   . Hypertension Mother   . High blood pressure  Father   . Hyperlipidemia Father   . Hypertension Father   . Crohn's disease Father   . Kidney disease Father   . Peripheral vascular disease Father   . Peripheral vascular disease Paternal Grandfather     Allergies  Allergen Reactions  . Banana Other (See Comments)    Headaches  . Nsaids Nausea Only and Other (See Comments)    Also caused stomach ulcers (can only tolerate in limited doses)  . Other Other (See Comments)    SSRI(s): Makes the patient feel "disconnected" Tricyclic antidepressants- Muscle spasms Sulfates- Itching  . Sulfa Antibiotics Hives    Current Outpatient Medications on File Prior to Visit  Medication Sig Dispense Refill  . acetaminophen (TYLENOL) 650 MG CR tablet Take 650 mg by mouth every 8 (eight) hours as needed for pain. Reported on 07/01/2015    . Albuterol Sulfate (PROAIR RESPICLICK) 108 (90 Base) MCG/ACT AEPB Inhale 1-2 puffs into the lungs every 6 (six) hours. 1 each 0  . clonazePAM (KLONOPIN) 0.5 MG tablet Take 1 tablet (0.5 mg total) by mouth 2 (two) times daily as needed for anxiety. 15 tablet 0  . famciclovir (FAMVIR) 500 MG tablet Take 1 tablet (500 mg total) by mouth 3 (three) times daily. 21 tablet 0  . fluticasone (FLONASE) 50 MCG/ACT nasal spray Place 2 sprays into both nostrils daily. 16 g 1  . gabapentin (NEURONTIN) 300 MG capsule TAKE 1 CAPSULE (300 MG TOTAL) BY MOUTH 4 (FOUR) TIMES DAILY. 120 capsule 0  . methocarbamol (ROBAXIN) 500 MG tablet 1 tab po q hs 30 tablet 1  . Milnacipran (SAVELLA) 50 MG TABS tablet Take 1 tablet (50 mg total) by mouth 2 (two) times daily. 60 tablet 2  . ondansetron (ZOFRAN ODT) 4 MG disintegrating tablet Take 1 tablet (4 mg total) by mouth every 8 (eight) hours as needed for nausea or vomiting. 10 tablet 0  . polyethylene glycol (MIRALAX / GLYCOLAX) packet Take 17 g by mouth daily. 14 each 0  . promethazine (PHENERGAN) 12.5 MG tablet Take 1 tablet (12.5 mg total) by mouth every 8 (eight) hours as needed for nausea  or vomiting. 6 tablet 0  . SUMAtriptan (IMITREX) 50 MG tablet One tab at onset of migraine - may repeat in 2 hrs if needed (max dose: 2 tabs/24 hrs or 15/month).  Please call [phone] to schedule appt for continued refills. 15 tablet 1  . traMADol (ULTRAM) 50 MG tablet Take 1 tablet (50 mg total) by mouth every 8 (eight) hours as needed  for severe pain. 90 tablet 0   No current facility-administered medications on file prior to visit.     BP 107/60 (BP Location: Left Arm, Patient Position: Sitting, Cuff Size: Normal)   Pulse 96   Resp 16   Ht  (1.6 m)   Wt 158 lb (71.7 kg)   SpO2 99%   BMI 27.99 kg/m       Objective:   Physical Exam  General Mental Status- Alert. General Appearance- Not in acute distress.   Skin General: Color- Normal Color. Moisture- Normal Moisture.  Neck Carotid Arteries- Normal color. Moisture- Normal Moisture. No carotid bruits. No JVD.  Chest and Lung Exam Auscultation: Breath Sounds:-Normal.  Cardiovascular Auscultation:Rythm- Regular. Murmurs & Other Heart Sounds:Auscultation of the heart reveals- No Murmurs.  Abdomen Inspection:-Inspeection Normal. Palpation/Percussion:Note:No mass. Palpation and Percussion of the abdomen reveal- Non Tender, Non Distended + BS, no rebound or guarding.   Neurologic Cranial Nerve exam:- CN III-XII intact(No nystagmus), symmetric smile. Strength:- 5/5 equal and symmetric strength both upper and lower extremities.     Assessment & Plan:  With your recent high level stress and unemployment, I am going to give you higher volume of clonazepam to use twice daily if needed.  You had recent panic attacks and if you find your self in situation like this would like you to have medication to abort the attack.  However please do use the medication sparingly/when needed.  For history of fibromyalgia, would continue the gabapentin and tramadol as prescribed.  We will refill those medications when you are due.  I  would recommend that you continue the [facility] as long as possible.  We discussed how you could taper off if the medication was too expensive.  However would ask  that you talk with the pharmacist about what the cost would be paying cash.  Also asked if there are any coupons that you could use.   Follow-up in 1 month or as needed.  Esperanza Richters, PA-C

## 2017-08-06 ENCOUNTER — Encounter: Payer: Self-pay | Admitting: Medical

## 2017-08-07 ENCOUNTER — Telehealth: Payer: Self-pay

## 2017-08-07 NOTE — Telephone Encounter (Signed)
Called into [phone] spoke w/ Gene- PA initiated and approved for medication. Reference ID: [id number], approved from 08/07/2017 through 08/02/2018. Pt Medicaid ID: [id number] Q

## 2017-08-16 ENCOUNTER — Other Ambulatory Visit: Payer: Self-pay | Admitting: Medical

## 2017-08-19 MED ORDER — TRAMADOL HCL 50 MG PO TABS: 50.0000 mg | ORAL_TABLET | Freq: Three times a day (TID) | ORAL | 0 refills | Status: DC | PRN

## 2017-08-19 NOTE — Telephone Encounter (Signed)
Requesting:Tramadol Contract:02/18/17 UDS:02/18/17 Last Visit:07/30/17 Next Visit:none with pcp Last Refill:07/03/17 No discrepancies  Please Advise

## 2017-08-19 NOTE — Telephone Encounter (Signed)
I did send prescription of tramadol to her pharmacy.  Notify patient prescription sent in.

## 2017-08-20 ENCOUNTER — Encounter: Payer: Self-pay | Admitting: Medical

## 2017-08-27 MED FILL — traMADol HCL 50 MG TABS: 50 | 30 days supply | Qty: 90 | Fill #0

## 2017-08-27 NOTE — Telephone Encounter (Signed)
Pt received   rx on 08/19/2017

## 2017-09-25 ENCOUNTER — Other Ambulatory Visit: Payer: Self-pay | Admitting: Medical

## 2017-10-08 ENCOUNTER — Encounter: Payer: Self-pay | Admitting: Medical

## 2017-10-08 ENCOUNTER — Other Ambulatory Visit: Payer: Self-pay | Admitting: Medical

## 2017-10-08 NOTE — Telephone Encounter (Signed)
Please advise?  Last tramadol RX: 08/19/17, #90 Last OV: 07/30/17 Next OV: f/u 1 month or as needed but not yet scheduled. UDS: 02/18/17 CSC: 02/18/17 CSR: Registry shows dispense dates of 5/20 and 08/27/17, #90 [facility] from [facility]. Spoke with Elita QuickPam and she states rx was too soon to fill on 08/19/17 and refill was deferred to 08/27/17. Rx was only dispensed on 08/27/17, #90. NARX report place din PCP's red folder for review.

## 2017-10-09 ENCOUNTER — Other Ambulatory Visit: Payer: Self-pay | Admitting: Medical

## 2017-10-09 ENCOUNTER — Telehealth: Payer: Self-pay | Admitting: Medical

## 2017-10-09 MED ORDER — TRAMADOL HCL 50 MG PO TABS: ORAL_TABLET | ORAL | 0 refills | Status: DC

## 2017-10-09 NOTE — Telephone Encounter (Signed)
Reviewed patient's narcs profile and your note about tramadol only being dispensed one time end of May.  Thanks for printing  the report.  I did go ahead and send in 1 months prescription.  But please notify patient that she has to come in next month for office visit to follow the controlled medication rules.  She might not want to come in since I am not sure if she still has insurance?.  But appointment next month would be required to get further refills.

## 2017-10-09 NOTE — Telephone Encounter (Signed)
Pt no longer on savella. Took of list.

## 2017-10-11 NOTE — Telephone Encounter (Signed)
Author phoned pt to relay Joanna Anderson's message re: needing OV prior to additional tramadol refills. Pt. Is concerned about paying out of pocket for an OV, however, but understands the restrictions she will have in getting her tramadol next time. Pt. Stated she has been trying not to be so reliant on tramadol, and has been exercising more to deal with her fibromyalgia pain. Pt. reports a no-processed sugar diet has improved migraines, but she is discouraged with her tramadol dependence and weight. Author stated she would forward to PA, and author encouraged pt. To keep swimming. Pt. did not want to make an appointment at this time.

## 2017-10-21 ENCOUNTER — Encounter: Payer: Self-pay | Admitting: Medical

## 2017-10-23 ENCOUNTER — Encounter: Payer: Self-pay | Admitting: Medical

## 2017-10-28 ENCOUNTER — Ambulatory Visit (INDEPENDENT_AMBULATORY_CARE_PROVIDER_SITE_OTHER): Payer: Medicaid Other | Admitting: Medical

## 2017-10-28 ENCOUNTER — Encounter: Payer: Self-pay | Admitting: Medical

## 2017-10-28 VITALS — BP 119/53 | HR 82 | Temp 99.1°F | Resp 16 | Ht 63.0 in | Wt 162.0 lb

## 2017-10-28 DIAGNOSIS — S2232XA Fracture of one rib, left side, initial encounter for closed fracture: Secondary | ICD-10-CM | POA: Diagnosis not present

## 2017-10-28 DIAGNOSIS — S32008A Other fracture of unspecified lumbar vertebra, initial encounter for closed fracture: Secondary | ICD-10-CM | POA: Diagnosis not present

## 2017-10-28 DIAGNOSIS — T300 Burn of unspecified body region, unspecified degree: Secondary | ICD-10-CM | POA: Diagnosis not present

## 2017-10-28 MED ORDER — MUPIROCIN 2 % EX OINT: TOPICAL_OINTMENT | CUTANEOUS | 0 refills | Status: DC

## 2017-10-28 MED ORDER — HYDROCODONE-ACETAMINOPHEN 5-325 MG PO TABS: ORAL_TABLET | ORAL | 0 refills | Status: DC

## 2017-10-28 MED FILL — HYDROCODON-APAP 5-325: 5-325 | 5 days supply | Qty: 40 | Fill #0
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
## 2017-11-04 ENCOUNTER — Encounter: Payer: Self-pay | Admitting: Medical

## 2017-11-13 ENCOUNTER — Telehealth: Payer: Self-pay | Admitting: *Deleted

## 2017-11-13 NOTE — Telephone Encounter (Signed)
[facility] [employer]; forwarded to [place] for email/scan/SLS 08/14

## 2017-11-28 ENCOUNTER — Ambulatory Visit (INDEPENDENT_AMBULATORY_CARE_PROVIDER_SITE_OTHER): Payer: Medicaid Other | Admitting: Medical

## 2017-11-28 ENCOUNTER — Encounter: Payer: Self-pay | Admitting: Medical

## 2017-11-28 VITALS — BP 122/54 | HR 73 | Temp 98.3°F | Resp 16 | Ht 63.0 in | Wt 161.8 lb

## 2017-11-28 DIAGNOSIS — M5432 Sciatica, left side: Secondary | ICD-10-CM

## 2017-11-28 DIAGNOSIS — M546 Pain in thoracic spine: Secondary | ICD-10-CM | POA: Diagnosis not present

## 2017-11-28 DIAGNOSIS — S32008A Other fracture of unspecified lumbar vertebra, initial encounter for closed fracture: Secondary | ICD-10-CM | POA: Diagnosis not present

## 2017-11-28 DIAGNOSIS — S2232XA Fracture of one rib, left side, initial encounter for closed fracture: Secondary | ICD-10-CM | POA: Diagnosis not present

## 2017-11-28 MED ORDER — HYDROCODONE-ACETAMINOPHEN 5-325 MG PO TABS: 1.0000 | ORAL_TABLET | Freq: Four times a day (QID) | ORAL | 0 refills | Status: DC | PRN

## 2017-11-28 MED ORDER — TRAMADOL HCL 50 MG PO TABS: ORAL_TABLET | ORAL | 0 refills | Status: DC

## 2017-11-28 MED FILL — traMADol HCL 50 MG TABS: 50 | 30 days supply | Qty: 90 | Fill #0

## 2017-11-28 MED FILL — HYDROCODON-APAP 5-325: 5-325 | 7 days supply | Qty: 30 | Fill #0
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
## 2017-11-28 NOTE — Patient Instructions (Signed)
For your area of persisting pain continue your standard regimen for pain. However, you do report occasional severe pain that your standard regimen does not cover. If you get severe pain can use norco 5/325 mg. But extreme caution not to use with tramadol as we discussed today. Pt expressed understanding.  Continue to follow up with PT and see pain specialist.  Follow up with me in 6 weeks or as needed

## 2017-12-25 ENCOUNTER — Other Ambulatory Visit: Payer: Self-pay | Admitting: Medical

## 2017-12-25 ENCOUNTER — Encounter: Payer: Self-pay | Admitting: Medical

## 2017-12-25 MED ORDER — GABAPENTIN 300 MG PO CAPS: 300.0000 mg | ORAL_CAPSULE | Freq: Four times a day (QID) | ORAL | 3 refills | Status: DC

## 2017-12-25 MED FILL — GABAPENTIN 300 MG CAPSULE: 300 | 30 days supply | Qty: 120 | Fill #0

## 2017-12-27 ENCOUNTER — Encounter: Payer: Self-pay | Admitting: Medical

## 2017-12-30 MED ORDER — FLUTICASONE PROPIONATE 50 MCG/ACT NA SUSP: 2.0000 | Freq: Every day | NASAL | 1 refills | Status: DC

## 2018-01-11 ENCOUNTER — Encounter: Payer: Self-pay | Admitting: Medical

## 2018-01-11 ENCOUNTER — Other Ambulatory Visit: Payer: Self-pay | Admitting: Medical

## 2018-01-13 MED ORDER — TRAMADOL HCL 50 MG PO TABS: ORAL_TABLET | ORAL | 0 refills | Status: DC

## 2018-01-13 NOTE — Telephone Encounter (Signed)
Will you run narx report. Refilled tramadol today.  Notify her she needs to be seen next before I can refill again in November. Looks like UDS done 11 month ago. She should be on contract. Will you make sure it is update to date. Let me know if contract expired.

## 2018-01-13 NOTE — Telephone Encounter (Signed)
Pt is requesting refill on tramadol.   Last OV: 11/28/2017 Last Fill: 11/28/2017 #[id number] and 0RF UDS: 02/18/2017

## 2018-01-14 ENCOUNTER — Other Ambulatory Visit: Payer: Self-pay

## 2018-01-14 DIAGNOSIS — M5442 Lumbago with sciatica, left side: Secondary | ICD-10-CM

## 2018-01-14 MED ORDER — TRAMADOL HCL 50 MG PO TABS: ORAL_TABLET | ORAL | 0 refills | Status: DC

## 2018-01-14 NOTE — Telephone Encounter (Signed)
Thanks for cancelling that rx of tramadol.

## 2018-01-14 NOTE — Telephone Encounter (Signed)
Pt already has appointment  Scheduled 02/13/18. Will Need UDS and contract next visit.

## 2018-01-14 NOTE — Telephone Encounter (Signed)
Pt. requesting tramadol to be resent to [facility] in [place]. Author phoned [facility] pharmacy to cancel rx sent on 10/14. Rx pended for PCP review.

## 2018-01-16 ENCOUNTER — Encounter: Payer: Self-pay | Admitting: Medical

## 2018-01-17 ENCOUNTER — Telehealth: Payer: Self-pay

## 2018-01-17 NOTE — Telephone Encounter (Signed)
PA initiated via Covermymeds; KEY: [id number]. Awaiting determination.

## 2018-01-20 NOTE — Telephone Encounter (Signed)
PA denied. Pt is not eligible for pharmacy benefits at this time? If this is incorrect, Pt should contact her insurance at member services.

## 2018-02-06 MED FILL — GABAPENTIN 300 MG CAPSULE: 300 | 30 days supply | Qty: 120 | Fill #1

## 2018-02-13 ENCOUNTER — Ambulatory Visit: Payer: Medicaid Other | Admitting: Medical

## 2018-02-20 ENCOUNTER — Ambulatory Visit: Payer: Medicaid Other | Admitting: Medical

## 2018-03-06 ENCOUNTER — Other Ambulatory Visit (HOSPITAL_COMMUNITY)
Admission: RE | Admit: 2018-03-06 | Discharge: 2018-03-06 | Disposition: A | Discharge status: Home / Self Care | Payer: 59 | Source: Ambulatory Visit | Attending: Medical | Admitting: Medical

## 2018-03-06 ENCOUNTER — Ambulatory Visit (INDEPENDENT_AMBULATORY_CARE_PROVIDER_SITE_OTHER): Payer: 59 | Admitting: Medical

## 2018-03-06 ENCOUNTER — Encounter: Payer: Self-pay | Admitting: Medical

## 2018-03-06 VITALS — BP 112/48 | HR 74 | Temp 98.1°F | Resp 16 | Ht 63.0 in | Wt 158.8 lb

## 2018-03-06 DIAGNOSIS — M5442 Lumbago with sciatica, left side: Secondary | ICD-10-CM

## 2018-03-06 DIAGNOSIS — Z113 Encounter for screening for infections with a predominantly sexual mode of transmission: Secondary | ICD-10-CM | POA: Insufficient documentation

## 2018-03-06 DIAGNOSIS — Z79899 Other long term (current) drug therapy: Secondary | ICD-10-CM | POA: Diagnosis not present

## 2018-03-06 DIAGNOSIS — M797 Fibromyalgia: Secondary | ICD-10-CM | POA: Diagnosis not present

## 2018-03-06 MED ORDER — FLUTICASONE PROPIONATE 50 MCG/ACT NA SUSP: 2.0000 | Freq: Every day | NASAL | 1 refills | Status: DC

## 2018-03-06 MED ORDER — SUMATRIPTAN SUCCINATE 50 MG PO TABS: ORAL_TABLET | ORAL | 1 refills | Status: DC

## 2018-03-06 MED ORDER — TRAMADOL HCL 50 MG PO TABS: 50.0000 mg | ORAL_TABLET | Freq: Three times a day (TID) | ORAL | 0 refills | Status: DC | PRN
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
## 2018-03-07 MED FILL — traMADol HCL 50 MG TABS: 50 | 10 days supply | Qty: 30 | Fill #0

## 2018-03-08 LAB — PAIN MGMT, PROFILE 8 W/CONF, U
6 ACETYLMORPHINE: NEGATIVE ng/mL (ref <10)
AMPHETAMINES: NEGATIVE ng/mL (ref <500)
Alcohol Metabolites: NEGATIVE ng/mL (ref <500)
BUPRENORPHINE, URINE: NEGATIVE ng/mL (ref <5)
Benzodiazepines: NEGATIVE ng/mL (ref <100)
CREATININE: 157.7 mg/dL
Cocaine Metabolite: NEGATIVE ng/mL (ref <150)
MARIJUANA METABOLITE: NEGATIVE ng/mL (ref <20)
MDMA: NEGATIVE ng/mL (ref <500)
OXIDANT: NEGATIVE ug/mL (ref <200)
Opiates: NEGATIVE ng/mL (ref <100)
Oxycodone: NEGATIVE ng/mL (ref <100)
pH: 6.99 (ref 4.5–9.0)

## 2018-03-10 ENCOUNTER — Telehealth: Payer: Self-pay | Admitting: Medical

## 2018-03-10 LAB — URINE CYTOLOGY ANCILLARY ONLY
BACTERIAL VAGINITIS: POSITIVE — AB
CHLAMYDIA, DNA PROBE: NEGATIVE
Neisseria Gonorrhea: NEGATIVE
TRICH (WINDOWPATH): NEGATIVE

## 2018-03-10 MED ORDER — METRONIDAZOLE 500 MG PO TABS: 500.0000 mg | ORAL_TABLET | Freq: Three times a day (TID) | ORAL | 0 refills | Status: DC

## 2018-03-10 NOTE — Telephone Encounter (Signed)
Rx flagyl sent to pt pharmacy. 

## 2018-03-20 ENCOUNTER — Ambulatory Visit (INDEPENDENT_AMBULATORY_CARE_PROVIDER_SITE_OTHER): Payer: 59 | Admitting: Medical

## 2018-03-20 ENCOUNTER — Encounter: Payer: Self-pay | Admitting: Medical

## 2018-03-20 VITALS — BP 130/67 | HR 87 | Temp 99.1°F | Resp 16 | Ht 63.0 in | Wt 159.0 lb

## 2018-03-20 DIAGNOSIS — L309 Dermatitis, unspecified: Secondary | ICD-10-CM

## 2018-03-20 DIAGNOSIS — L739 Follicular disorder, unspecified: Secondary | ICD-10-CM | POA: Diagnosis not present

## 2018-03-20 DIAGNOSIS — R21 Rash and other nonspecific skin eruption: Secondary | ICD-10-CM | POA: Diagnosis not present

## 2018-03-20 MED ORDER — METHYLPREDNISOLONE 4 MG PO TABS: ORAL_TABLET | ORAL | 0 refills | Status: DC

## 2018-03-20 MED ORDER — CEPHALEXIN 500 MG PO CAPS: 500.0000 mg | ORAL_CAPSULE | Freq: Three times a day (TID) | ORAL | 0 refills | Status: DC

## 2018-03-20 NOTE — Patient Instructions (Signed)
Your lower extremity rash on inspection and palpation seems to favor folliculitis.  You also report some exposure to hot tub recently.  I recommend moisturize your legs well and to go ahead and start Keflex antibiotic.  Give us update as to whether you are improving in about 3 days.  Keflex has some good coverage but your allergy history does prohibit use of Bactrim which would be a good choice.  Might need to change antibiotic if not responding to Keflex.  Your areas of rash on upper thorax are very minimal at this time.  You just got a steroid injection and do not want to have you start methylprednisolone just yet.  Rather I want you to see if you have any spreading rash.  If so then start tapered methylprednisone over the next 5 days.  Follow-up in 7 to 10 days or as needed.

## 2018-03-20 NOTE — Progress Notes (Signed)
Pre visit review using our clinic review tool, if applicable. No additional management support is needed unless otherwise documented below in the visit note. 

## 2018-03-20 NOTE — Progress Notes (Signed)
Subjective:    Patient ID: Joanna Anderson, female    DOB: 12/14/1965, 52 y.o.   MRN: [id number]  HPI  Pt in with rash on upper chest, legs and some on her back.  Just started yesterday. No itching. Pt states has used flagyl recently. But has used metrogel before with no reaction. Pt finished out her oral flagyl for bv.  No other new meds. She did get steroid injection day before yesterday. She had this before and this is 4th one of this injection. Yesterday after injection she states face was little red after injection and she states felt mild flushing.  She in a new environment/new apartment. No new lotions, creams, or soaps.  On review no other potential exposures. She also had some hot tub exposure about one week ago. The calf area are worse regions of rash.      Review of Systems  Constitutional: Negative for chills, diaphoresis, fatigue and fever.  Respiratory: Negative for cough, chest tightness and shortness of breath.   Cardiovascular: Negative for chest pain and palpitations.  Gastrointestinal: Negative for abdominal pain.  Genitourinary: Negative for dyspareunia and enuresis.  Musculoskeletal: Negative for back pain.  Skin: Positive for rash.  Neurological: Negative for dizziness, weakness, light-headedness and headaches.  Hematological: Negative for adenopathy. Does not bruise/bleed easily.  Psychiatric/Behavioral: Negative for agitation, confusion and suicidal ideas. The patient is not nervous/anxious.     Past Medical History:  Diagnosis Date  . Anemia   . Anxiety   . ASD (atrial septal defect) 06/11/2014   S/p patch repair  . Atherosclerosis of abdominal aorta (HCC) 06/11/2014   Age advanced atherosclerosis of the infrarenal aorta 52  . Depression   . Dysmenorrhea   . Dysuria   . Encounter for long-term (current) use of other medications   . Fibromyalgia   . Headache   . Hematuria   . High cholesterol   . Left knee pain   . Migraine   . Other malaise and  fatigue   . Pure hyperglyceridemia   . Routine general medical examination at a health care facility   . Shoulder pain   . Vitamin D deficiency   . Vitamin D deficiency   . VSD (ventricular septal defect), multiple 06/11/2014   S/p patch repair     Social History   Socioeconomic History  . Marital status: Married    Spouse name: Not on file  . Number of children: 4  . Years of education: College  . Highest education level: Not on file  Occupational History  . Occupation: Charity fundraiserN    Comment: [employer]  Social Needs  . Financial resource strain: Not on file  . Food insecurity:    Worry: Not on file    Inability: Not on file  . Transportation needs:    Medical: Not on file    Non-medical: Not on file  Tobacco Use  . Smoking status: Never Smoker  . Smokeless tobacco: Never Used  Substance and Sexual Activity  . Alcohol use: No    Alcohol/week: 0.0 standard drinks  . Drug use: No  . Sexual activity: Not on file  Lifestyle  . Physical activity:    Days per week: Not on file    Minutes per session: Not on file  . Stress: Not on file  Relationships  . Social connections:    Talks on phone: Not on file    Gets together: Not on file    Attends religious service: Not on file  Active member of club or organization: Not on file    Attends meetings of clubs or organizations: Not on file    Relationship status: Not on file  . Intimate partner violence:    Fear of current or ex partner: Not on file    Emotionally abused: Not on file    Physically abused: Not on file    Forced sexual activity: Not on file  Other Topics Concern  . Not on file  Social History Narrative   Lives at home with husband and children.   Right-handed.    Past Surgical History:  Procedure Laterality Date  . c seaction    . CARDIAC SURGERY     as child  . CESAREAN SECTION    . KNEE ARTHROSCOPY W/ ACL RECONSTRUCTION AND HAMSTRING GRAFT Left   . vetricular hole      Family History  Problem Relation  Age of Onset  . Depression Mother   . High blood pressure Mother   . Hyperlipidemia Mother   . Anxiety disorder Mother   . Hypertension Mother   . High blood pressure Father   . Hyperlipidemia Father   . Hypertension Father   . Crohn's disease Father   . Kidney disease Father   . Peripheral vascular disease Father   . Peripheral vascular disease Paternal Grandfather     Allergies  Allergen Reactions  . Banana Other (See Comments)    Headaches  . Nsaids Nausea Only and Other (See Comments)    Also caused stomach ulcers (can only tolerate in limited doses)  . Other Other (See Comments)    SSRI(s): Makes the patient feel "disconnected" Tricyclic antidepressants- Muscle spasms Sulfates- Itching  . Sulfa Antibiotics Hives    "gums peeled off"    Current Outpatient Medications on File Prior to Visit  Medication Sig Dispense Refill  . acetaminophen (TYLENOL) 650 MG CR tablet Take 650 mg by mouth every 8 (eight) hours as needed for pain. Reported on 07/01/2015    . Albuterol Sulfate (PROAIR RESPICLICK) 108 (90 Base) MCG/ACT AEPB Inhale 1-2 puffs into the lungs every 6 (six) hours. 1 each 0  . fluticasone (FLONASE) 50 MCG/ACT nasal spray Place 2 sprays into both nostrils daily. 16 g 1  . gabapentin (NEURONTIN) 300 MG capsule Take 1 capsule (300 mg total) by mouth 4 (four) times daily. 120 capsule 3  . HYDROcodone-acetaminophen (NORCO) 5-325 MG tablet Take 1 tablet by mouth every 6 (six) hours as needed for moderate pain. 1 tab po q 6 hours as needed severe pain. 30 tablet 0  . polyethylene glycol (MIRALAX / GLYCOLAX) packet Take 17 g by mouth daily. 14 each 0  . clonazePAM (KLONOPIN) 0.5 MG tablet Take 1 tablet (0.5 mg total) by mouth 2 (two) times daily as needed for anxiety. 60 tablet 1  . famciclovir (FAMVIR) 500 MG tablet Take 1 tablet (500 mg total) by mouth 3 (three) times daily. (Patient not taking: Reported on 03/20/2018) 21 tablet 0  . methocarbamol (ROBAXIN) 500 MG tablet TAKE  1 TABLET BY MOUTH AT BEDTIME (Patient not taking: Reported on 03/20/2018) 30 tablet 0  . metroNIDAZOLE (FLAGYL) 500 MG tablet Take 1 tablet (500 mg total) by mouth 3 (three) times daily. 21 tablet 0  . mupirocin ointment (BACTROBAN) 2 % Apply thin film twice daily 22 g 0  . promethazine (PHENERGAN) 12.5 MG tablet Take 1 tablet (12.5 mg total) by mouth every 8 (eight) hours as needed for nausea or vomiting. 6 tablet  0  . SUMAtriptan (IMITREX) 50 MG tablet One tab at onset of migraine - may repeat in 2 hrs if needed (max dose: 2 tabs/24 hrs or 15/month).  Please call [phone] to schedule appt for continued refills. 15 tablet 1  . traMADol (ULTRAM) 50 MG tablet Take 1 tablet (50 mg total) by mouth every 8 (eight) hours as needed. 30 tablet 0   No current facility-administered medications on file prior to visit.     BP 130/67 (BP Location: Left Arm, Patient Position: Sitting, Cuff Size: Normal)   Pulse 87   Temp 99.1 F (37.3 C) (Oral)   Resp 16   Ht 5\' 3"  (1.6 m)   Wt 159 lb (72.1 kg)   SpO2 97%   BMI 28.17 kg/m       Objective:   Physical Exam  General Mental Status- Alert. General Appearance- Not in acute distress.   Skin You do have scattered red rash with follicles that appear inflamed upon palpation and inspection in pretibial regions..  No warmth or tenderness.    Faint minimal rash on both lower rib regions.  Faint slight pink rash left upper chest.  Really no obvious notable rash.  Back looks clear.  Neck Carotid Arteries- Normal color. Moisture- Normal Moisture. No carotid bruits. No JVD.  Chest and Lung Exam Auscultation: Breath Sounds:-Normal.  Cardiovascular Auscultation:Rythm- Regular. Murmurs & Other Heart Sounds:Auscultation of the heart reveals- No Murmurs.  Abdomen Inspection:-Inspeection Normal. Palpation/Percussion:Note:No mass. Palpation and Percussion of the abdomen reveal- Non Tender, Non Distended + BS, no rebound or  guarding.   Neurologic Cranial Nerve exam:- CN III-XII intact(No nystagmus), symmetric smile. Strength:- 5/5 equal and symmetric strength both upper and lower extremities.      Assessment & Plan:  Your lower extremity rash on inspection and palpation seems to favor folliculitis.  You also report some exposure to hot tub recently.  I recommend moisturize your legs well and to go ahead and start Keflex antibiotic.  Give us update as to whether you are improving in about 3 days.  Keflex has some good coverage but your allergy history does prohibit use of Bactrim which would be a good choice.  Might need to change antibiotic if not responding to Keflex.  Your areas of rash on upper thorax are very minimal at this time.  You just got a steroid injection and do not want to have you start methylprednisolone just yet.  Rather I want you to see if you have any spreading rash.  If so then start tapered methylprednisone over the next 5 days.  Follow-up in 7 to 10 days or as needed.  Esperanza RichtersEdward Jesyca Weisenburger, PA-C

## 2018-03-27 ENCOUNTER — Encounter: Payer: Self-pay | Admitting: Medical

## 2018-03-27 MED FILL — GABAPENTIN 300 MG CAPSULE: 300 | 30 days supply | Qty: 120 | Fill #2

## 2018-03-28 ENCOUNTER — Other Ambulatory Visit: Payer: Self-pay | Admitting: Medical

## 2018-03-28 NOTE — Telephone Encounter (Signed)
Copied from CRM [id number]. Topic: Quick Communication - Rx Refill/Question >> Mar 28, 2018  3:48 PM Jens SomMedley, Jennifer A wrote: Medication: traMADol (ULTRAM) 50 MG tablet [[id number]][[id number]]   Has the patient contacted their pharmacy? Yes  (Agent: If no, request that the patient contact the pharmacy for the refill.) (Agent: If yes, when and what did the pharmacy advise?)  Preferred Pharmacy (with phone number or street name[facility] [address] [place] [phone]  Agent: Please be advised that RX refills may take up to 3 business days. We ask that you follow-up with your pharmacy.

## 2018-03-31 ENCOUNTER — Telehealth: Payer: Self-pay | Admitting: Medical

## 2018-03-31 MED ORDER — CEPHALEXIN 500 MG PO CAPS: 500.0000 mg | ORAL_CAPSULE | Freq: Three times a day (TID) | ORAL | 0 refills | Status: DC

## 2018-03-31 MED ORDER — TRAMADOL HCL 50 MG PO TABS: ORAL_TABLET | ORAL | 0 refills | Status: DC

## 2018-03-31 NOTE — Telephone Encounter (Signed)
Rx tramadol sent to pt pharmacy. 30 tab rx.

## 2018-03-31 NOTE — Telephone Encounter (Signed)
Rx 5 additional days of keflex sent to pt pharmacy.

## 2018-03-31 NOTE — Telephone Encounter (Signed)
I think patient is overdue for controlled medication contract.   would you call her and get her scheduled just to come in to sign the contract.  She does not need a official appointment with me.  Also if you could get her to do a UDS if 1 has not been done within the last year.

## 2018-04-03 ENCOUNTER — Telehealth: Payer: Self-pay

## 2018-04-03 NOTE — Telephone Encounter (Signed)
PA initiated via Covermymeds; KEY: [id number]. Awaiting determination.

## 2018-04-03 NOTE — Telephone Encounter (Signed)
LVM informing pt the below and for pt to call to schedule a lab appt for UDS.

## 2018-04-03 NOTE — Telephone Encounter (Signed)
PA cancelled. See note from insurance below.   Your PA request has been closed. No PA is required. Pharmacy should process request - HU, 04/03/2018 08:36 AM.

## 2018-04-04 ENCOUNTER — Telehealth: Payer: Self-pay | Admitting: Medical

## 2018-04-04 NOTE — Telephone Encounter (Signed)
Pt needs updated contract for tramadol. Will you get her to sign contract. She does not need appointment with me. Just needs to sign. She is waiting for you to call and advise when to come in.

## 2018-04-07 IMAGING — MG DIGITAL SCREENING BILATERAL MAMMOGRAM WITH CAD
6 series · 6 of 6 positions shown · non-contrast
Comparison: Previous exam(s).

CLINICAL DATA: Screening.

EXAM:
DIGITAL SCREENING BILATERAL MAMMOGRAM WITH CAD

[L MLO]
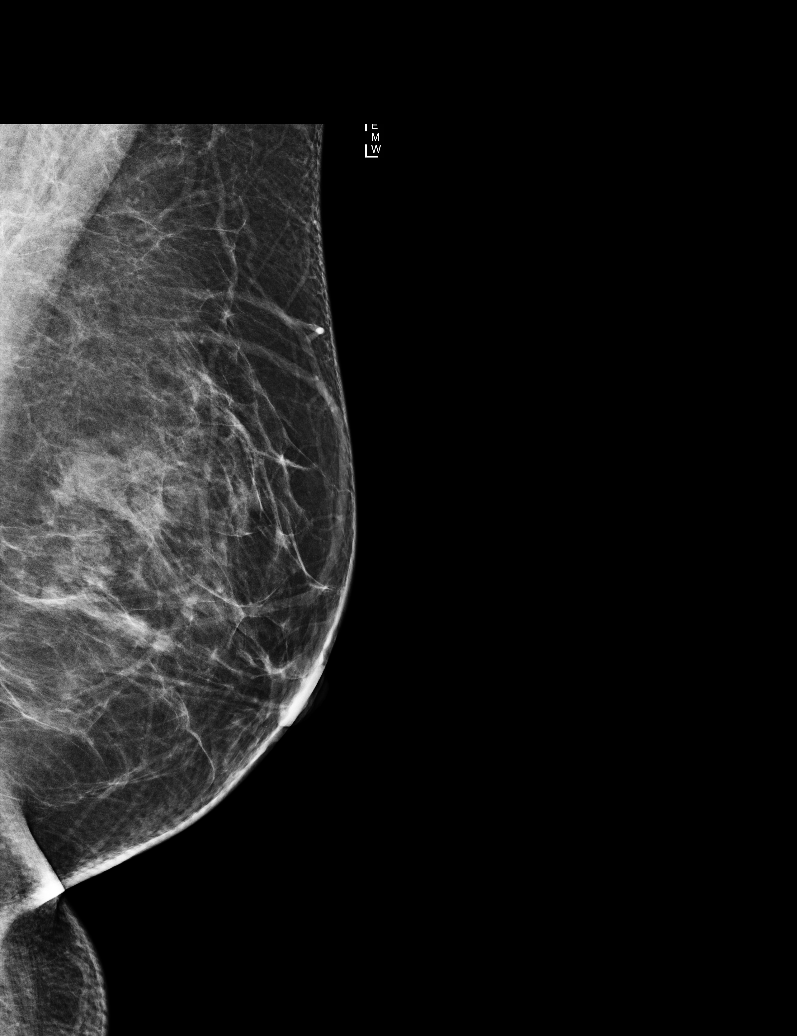

[R MLO]
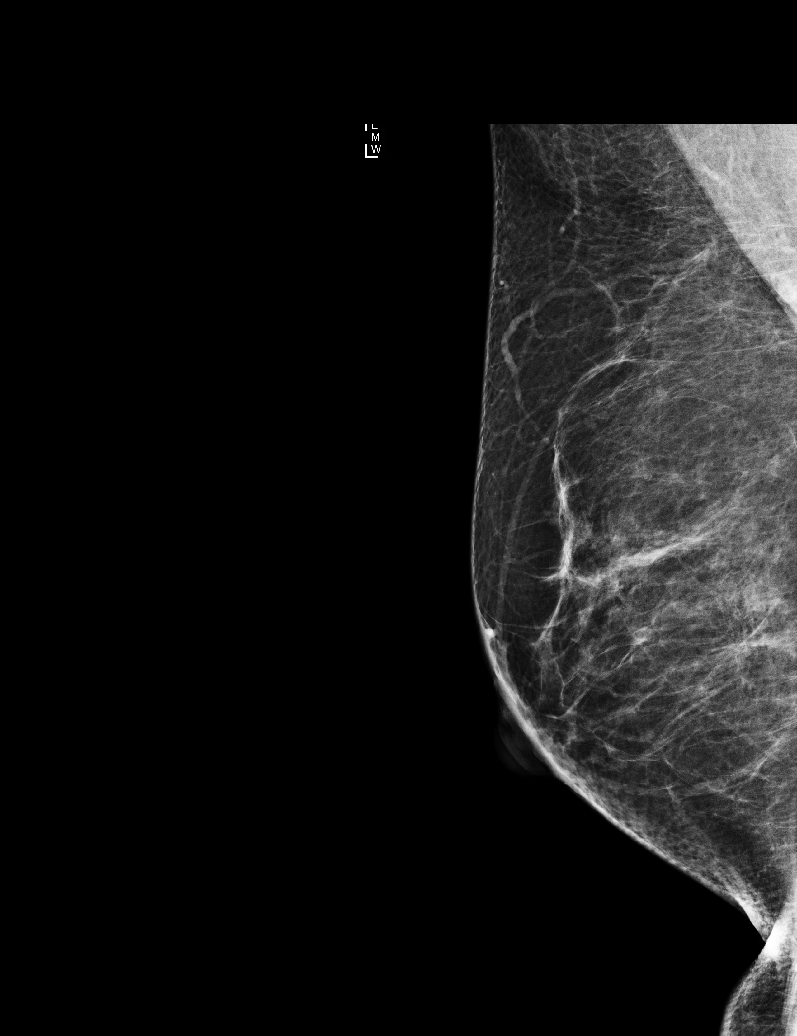

[R XCCL]
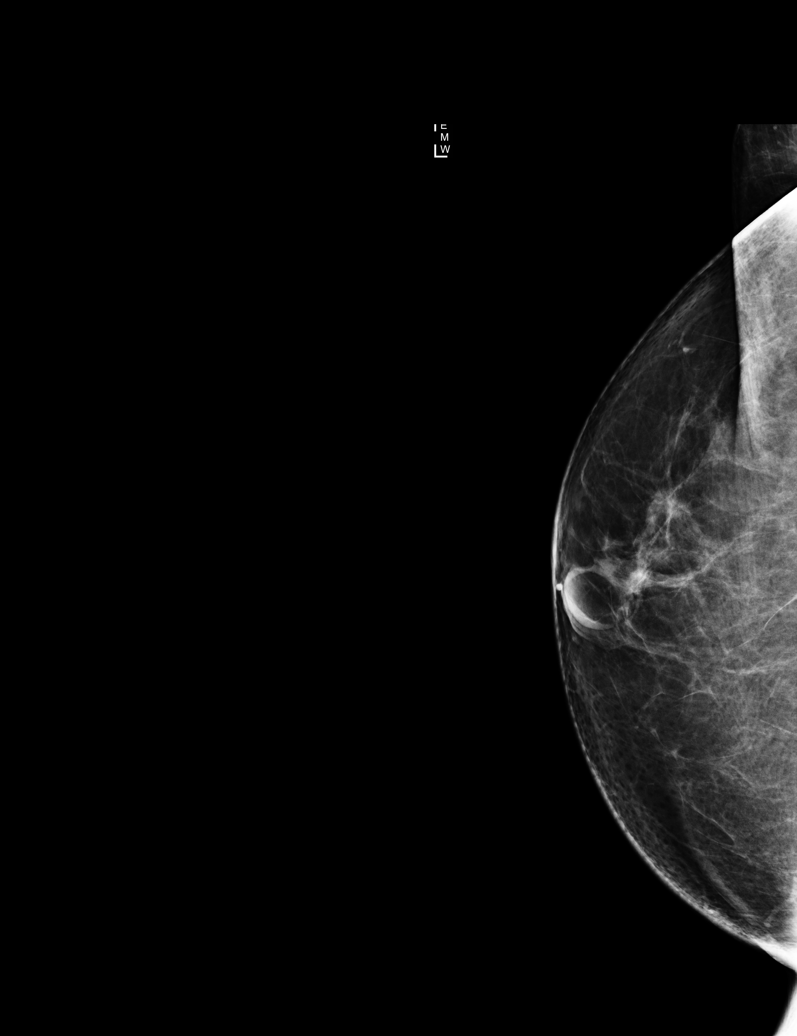

[L CC]
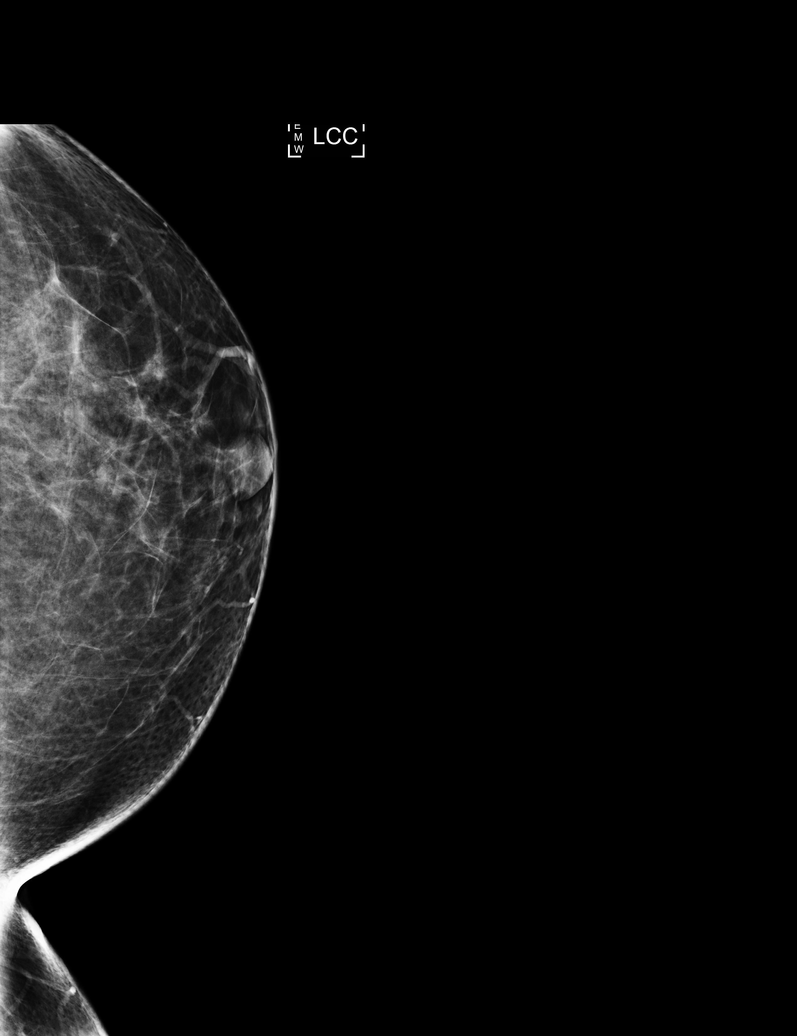

[R CC]
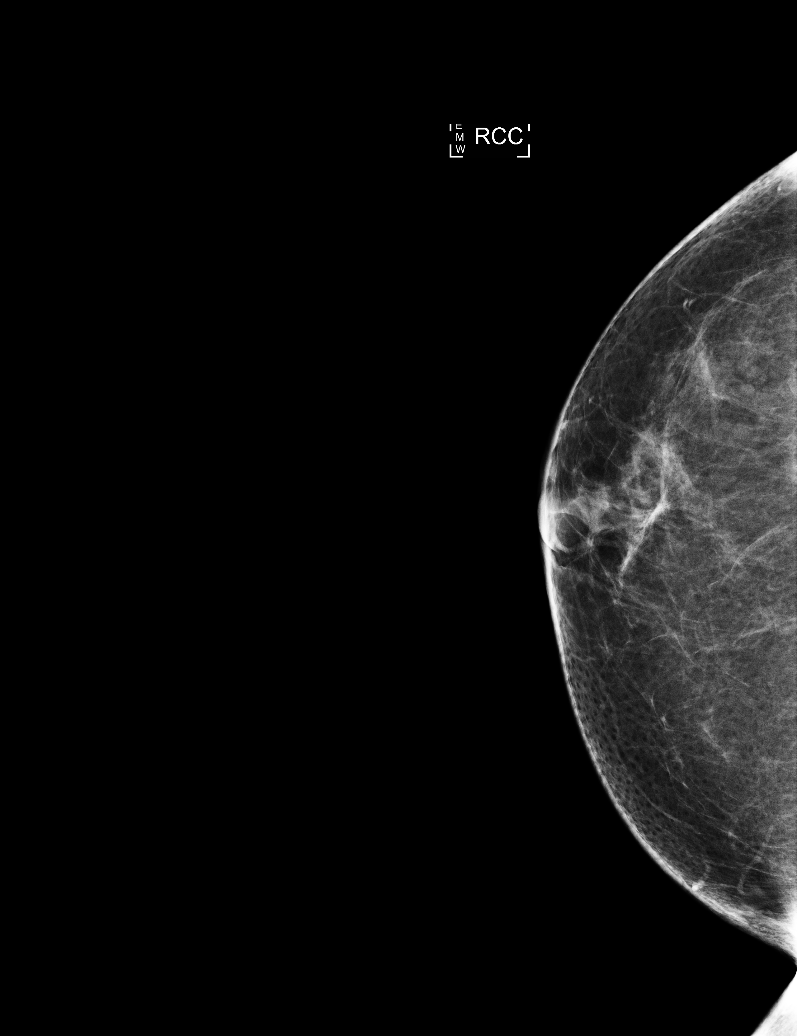

[L XCCL]
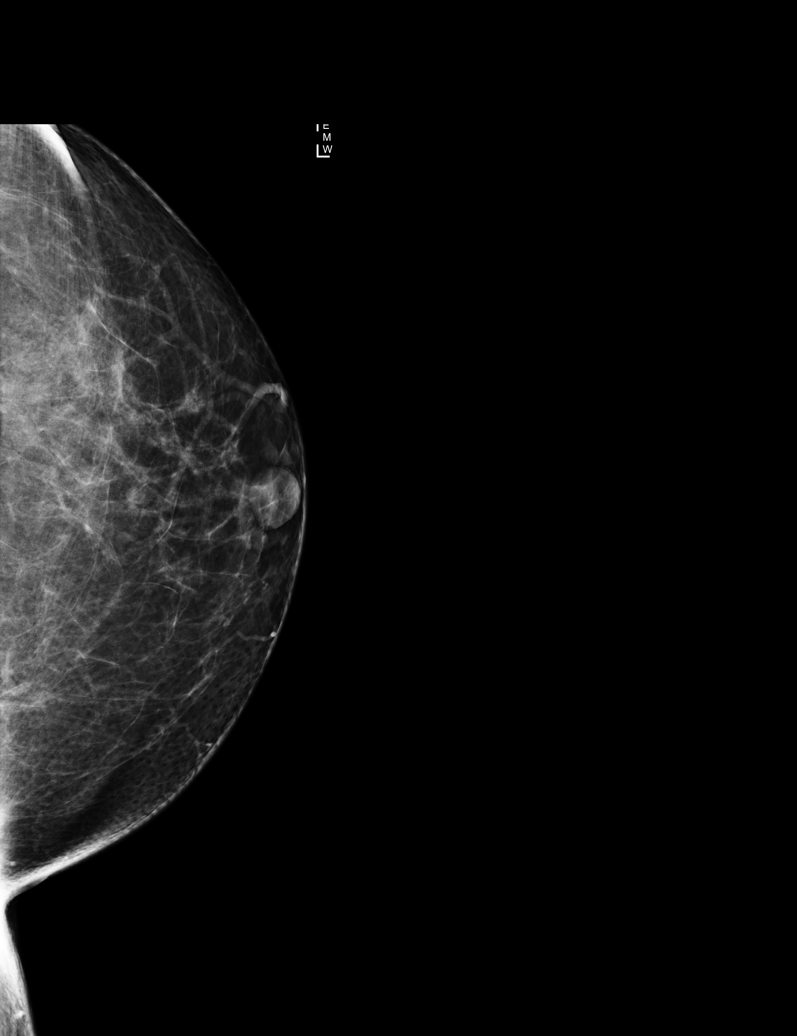

[6 of 6 positions shown; findings below may reference images not displayed]

ACR Breast Density Category b: There are scattered areas of
fibroglandular density.
FINDINGS: There are no findings suspicious for malignancy. Images were
processed with CAD.
IMPRESSION: No mammographic evidence of malignancy. A result letter of this
screening mammogram will be mailed directly to the patient.

RECOMMENDATION:
Screening mammogram in one year. (Code:AS-G-LCT)

BI-RADS CATEGORY  1: Negative.

## 2018-04-07 NOTE — Telephone Encounter (Signed)
Pt signed contract 03/06/18

## 2018-04-07 NOTE — Telephone Encounter (Signed)
Ok. Thanks. I see that now.

## 2018-04-10 ENCOUNTER — Telehealth: Payer: Self-pay | Admitting: Medical

## 2018-04-10 MED ORDER — TRAMADOL HCL 50 MG PO TABS: ORAL_TABLET | ORAL | 0 refills | Status: DC

## 2018-04-10 NOTE — Telephone Encounter (Signed)
Pt states is needing refill on traMADol (ULTRAM) 50 MG tablet (last was 03-31-2018) Pt states is needing refill since she is leaving out of town tomorrow. Please send to [facility] pharmacy, Pt tel [phone]. Please advise.

## 2018-04-10 NOTE — Telephone Encounter (Signed)
Refill Request:Tramadol  Last RX:03/31/18 Last OV:03/20/18 Next MO:QHUT scheduled  UDS:03/06/18 CSC:03/06/18 CSR:

## 2018-04-10 NOTE — Telephone Encounter (Signed)
Rx tramadol sent to pt pharmacy. 

## 2018-04-14 ENCOUNTER — Other Ambulatory Visit: Payer: Self-pay | Admitting: Medical

## 2018-04-14 ENCOUNTER — Encounter: Payer: Self-pay | Admitting: Medical

## 2018-04-23 ENCOUNTER — Encounter: Payer: Self-pay | Admitting: Medical

## 2018-04-23 ENCOUNTER — Telehealth: Payer: Self-pay | Admitting: *Deleted

## 2018-04-23 ENCOUNTER — Ambulatory Visit (INDEPENDENT_AMBULATORY_CARE_PROVIDER_SITE_OTHER): Payer: 59 | Admitting: Medical

## 2018-04-23 VITALS — BP 102/45 | HR 60 | Temp 98.3°F | Resp 16 | Ht 63.0 in | Wt 163.6 lb

## 2018-04-23 DIAGNOSIS — Z111 Encounter for screening for respiratory tuberculosis: Secondary | ICD-10-CM

## 2018-04-23 DIAGNOSIS — E538 Deficiency of other specified B group vitamins: Secondary | ICD-10-CM

## 2018-04-23 DIAGNOSIS — S30860A Insect bite (nonvenomous) of lower back and pelvis, initial encounter: Secondary | ICD-10-CM

## 2018-04-23 DIAGNOSIS — R61 Generalized hyperhidrosis: Secondary | ICD-10-CM

## 2018-04-23 DIAGNOSIS — E7220 Disorder of urea cycle metabolism, unspecified: Secondary | ICD-10-CM

## 2018-04-23 DIAGNOSIS — R5383 Other fatigue: Secondary | ICD-10-CM | POA: Diagnosis not present

## 2018-04-23 DIAGNOSIS — N926 Irregular menstruation, unspecified: Secondary | ICD-10-CM | POA: Diagnosis not present

## 2018-04-23 DIAGNOSIS — W57XXXA Bitten or stung by nonvenomous insect and other nonvenomous arthropods, initial encounter: Secondary | ICD-10-CM

## 2018-04-23 LAB — CBC WITH DIFFERENTIAL/PLATELET
Basophils Absolute: 0 10*3/uL (ref 0.0–0.1)
Basophils Relative: 0.4 % (ref 0.0–3.0)
Eosinophils Absolute: 0.1 10*3/uL (ref 0.0–0.7)
Eosinophils Relative: 1.6 % (ref 0.0–5.0)
HCT: 38.5 % (ref 36.0–46.0)
Hemoglobin: 12.8 g/dL (ref 12.0–15.0)
Lymphocytes Relative: 33.2 % (ref 12.0–46.0)
Lymphs Abs: 2.5 10*3/uL (ref 0.7–4.0)
MCHC: 33.3 g/dL (ref 30.0–36.0)
MCV: 90.2 fl (ref 78.0–100.0)
Monocytes Absolute: 0.6 10*3/uL (ref 0.1–1.0)
Monocytes Relative: 8.2 % (ref 3.0–12.0)
Neutro Abs: 4.3 10*3/uL (ref 1.4–7.7)
Neutrophils Relative %: 56.6 % (ref 43.0–77.0)
PLATELETS: 202 10*3/uL (ref 150.0–400.0)
RBC: 4.26 Mil/uL (ref 3.87–5.11)
RDW: 13.1 % (ref 11.5–15.5)
WBC: 7.6 10*3/uL (ref 4.0–10.5)

## 2018-04-23 LAB — COMPREHENSIVE METABOLIC PANEL
ALBUMIN: 4 g/dL (ref 3.5–5.2)
ALT: 11 U/L (ref 0–35)
AST: 14 U/L (ref 0–37)
Alkaline Phosphatase: 67 U/L (ref 39–117)
BUN: 11 mg/dL (ref 6–23)
CHLORIDE: 101 meq/L (ref 96–112)
CO2: 32 mEq/L (ref 19–32)
Calcium: 9.3 mg/dL (ref 8.4–10.5)
Creatinine, Ser: 0.59 mg/dL (ref 0.40–1.20)
GFR: 107.66 mL/min (ref >60.00)
Glucose, Bld: 86 mg/dL (ref 70–99)
Potassium: 3.8 mEq/L (ref 3.5–5.1)
SODIUM: 140 meq/L (ref 135–145)
Total Bilirubin: 0.6 mg/dL (ref 0.2–1.2)
Total Protein: 6.9 g/dL (ref 6.0–8.3)

## 2018-04-23 LAB — TSH: TSH: 1.81 u[IU]/mL (ref 0.35–4.50)

## 2018-04-23 LAB — FOLLICLE STIMULATING HORMONE: FSH: 63.5 m[IU]/mL

## 2018-04-23 NOTE — Progress Notes (Signed)
Subjective:    Patient ID: Joanna Anderson, female    DOB: [birth year], 53 y.o.   MRN: [id number]  HPI    Pt in today stating she has some recent severe sweating at night x 2 weeks. No cough. No documented fever but has not checked.  Pt in past saw hematologist for fever of undermined origin in past. Pt thinks hematologist told her she may have had an infection.   I thought at one point one of her specialist recommended getting mri. Pt does not remember this initially but later does remember after reviewed prior notes..  Pt last seen by me around 03/20/2018. She had folliculitis type rash. She responded to keflex. Pt temp that day was 99.1.  Pt has not been checking her temp recently with night sweats. No cough with night sweats.  Pt does mentions some menstrual irregularities. She states last menses was 5 weeks.   She is feeling tired recently last 2 weeks.    53 y.o. female presenting to the clinic for evaluation of recurrent fevers with multitude of other symptoms including generalized fatigue, and weakness, possible night sweats, possible skin rashes. Overall, patient is presently afebrile. The history does not affording itself easily to a single medical condition. Lymphoproliferative processes such as B-cell lymphoproliferative disorders or a T-cell lymphoma can cause the symptoms mentioned. Some other myeloproliferative processes could also contribute. Patient does not have a history of anaphylactic reactions such mast cell disorders or eosinophilia are less likely.   Below is work up for her prior work up for recurrent fevers.  Extensive lab work demonstrates no evidence of hematological abnormalities or presence of elevated inflammatory markers. Additional options for symptoms would include early menopause and hormonal disturbances. Alternatively, an autonomic dysfunction is possible. The only abnormal value discovered that is persistent it is mild elevation of ammonia level.  On review of literature, it is possible that the patient has bacterial overgrowth. Additionally, somatization of a psychological disorder such as depression or anxiety is possible.  Plan: --Labs today as outlined below --MRI Brain --RTC 2 week to review  Pt first work up with hematolgist gave below assessment.  Recurrent fever 53 y.o. female presenting to the clinic for evaluation of recurrent fevers with multitude of other symptoms including generalized fatigue, and weakness, possible night sweats, possible skin rashes.  Overall, patient is presently afebrile. The history is not affording itself easily to a single medical condition. Lymphoproliferative processes such as B-cell lymphoproliferative disorders or a T-cell lymphoma can cause the symptoms mentioned. Some other myeloproliferative processes could also contribute. Patient does not have a history of anaphylactic reactions such mast cell disorders or eosinophilia are less likely.  Inflammatory conditions such as lupus or rheumatoid arthritis are a possibility. Indolent infection such as TB, lyme, and other tickborne illnesses should also be on the differential.  Plan: --Labs today as outlined below --RTC 1 week to review  I did order rmsf and lyme early on during illness. Pt states last year working for [employer] she did not get tested for tb. She remembers some exposure to patient about 1.5 years ago at work.  She mentioned about 1.5 years ago she picked a lot of small ticks off of one of her dogs. Then remembers two tick bites in last year.  Pt states she never got mri due to thought deductable not paid off later found out deductable was paid off.   Review of Systems  Constitutional: Positive for diaphoresis and fatigue. Negative for chills,  fever and unexpected weight change.  Respiratory: Negative for cough, chest tightness, shortness of breath and wheezing.   Cardiovascular: Negative for chest pain and palpitations.    Gastrointestinal: Negative for abdominal pain, constipation and diarrhea.  Musculoskeletal: Negative for back pain, joint swelling and neck pain.  Skin: Negative for rash.  Neurological: Negative for dizziness, seizures, weakness, numbness and headaches.  Hematological: Negative for adenopathy. Does not bruise/bleed easily.  Psychiatric/Behavioral: Negative for behavioral problems and confusion. The patient is not nervous/anxious.     Past Medical History:  Diagnosis Date  . Anemia   . Anxiety   . ASD (atrial septal defect) 06/11/2014   S/p patch repair  . Atherosclerosis of abdominal aorta (HCC) 06/11/2014   Age advanced atherosclerosis of the infrarenal aorta  . Depression   . Dysmenorrhea   . Dysuria   . Encounter for long-term (current) use of other medications   . Fibromyalgia   . Headache   . Hematuria   . High cholesterol   . Left knee pain   . Migraine   . Other malaise and fatigue   . Pure hyperglyceridemia   . Routine general medical examination at a health care facility   . Shoulder pain   . Vitamin D deficiency   . Vitamin D deficiency   . VSD (ventricular septal defect), multiple 06/11/2014   S/p patch repair     Social History   Socioeconomic History  . Marital status: Married    Spouse name: Not on file  . Number of children: 4  . Years of education: College  . Highest education level: Not on file  Occupational History  . Occupation: Charity fundraiserN    Comment: [employer]  Social Needs  . Financial resource strain: Not on file  . Food insecurity:    Worry: Not on file    Inability: Not on file  . Transportation needs:    Medical: Not on file    Non-medical: Not on file  Tobacco Use  . Smoking status: Never Smoker  . Smokeless tobacco: Never Used  Substance and Sexual Activity  . Alcohol use: No    Alcohol/week: 0.0 standard drinks  . Drug use: No  . Sexual activity: Not on file  Lifestyle  . Physical activity:    Days per week: Not on file    Minutes per  session: Not on file  . Stress: Not on file  Relationships  . Social connections:    Talks on phone: Not on file    Gets together: Not on file    Attends religious service: Not on file    Active member of club or organization: Not on file    Attends meetings of clubs or organizations: Not on file    Relationship status: Not on file  . Intimate partner violence:    Fear of current or ex partner: Not on file    Emotionally abused: Not on file    Physically abused: Not on file    Forced sexual activity: Not on file  Other Topics Concern  . Not on file  Social History Narrative   Lives at home with husband and children.   Right-handed.    Past Surgical History:  Procedure Laterality Date  . c seaction    . CARDIAC SURGERY     as child  . CESAREAN SECTION    . KNEE ARTHROSCOPY W/ ACL RECONSTRUCTION AND HAMSTRING GRAFT Left   . vetricular hole      Family History  Problem Relation Age of Onset  . Depression Mother   . High blood pressure Mother   . Hyperlipidemia Mother   . Anxiety disorder Mother   . Hypertension Mother   . High blood pressure Father   . Hyperlipidemia Father   . Hypertension Father   . Crohn's disease Father   . Kidney disease Father   . Peripheral vascular disease Father   . Peripheral vascular disease Paternal Grandfather     Allergies  Allergen Reactions  . Banana Other (See Comments)    Headaches  . Nsaids Nausea Only and Other (See Comments)    Also caused stomach ulcers (can only tolerate in limited doses)  . Other Other (See Comments)    SSRI(s): Makes the patient feel "disconnected" Tricyclic antidepressants- Muscle spasms Sulfates- Itching  . Sulfa Antibiotics Hives    "gums peeled off"    Current Outpatient Medications on File Prior to Visit  Medication Sig Dispense Refill  . acetaminophen (TYLENOL) 650 MG CR tablet Take 650 mg by mouth every 8 (eight) hours as needed for pain. Reported on 07/01/2015    . Albuterol Sulfate (PROAIR  RESPICLICK) 108 (90 Base) MCG/ACT AEPB Inhale 1-2 puffs into the lungs every 6 (six) hours. 1 each 0  . cephALEXin (KEFLEX) 500 MG capsule Take 1 capsule (500 mg total) by mouth 3 (three) times daily. 15 capsule 0  . clonazePAM (KLONOPIN) 0.5 MG tablet Take 1 tablet (0.5 mg total) by mouth 2 (two) times daily as needed for anxiety. 60 tablet 1  . famciclovir (FAMVIR) 500 MG tablet Take 1 tablet (500 mg total) by mouth 3 (three) times daily. 21 tablet 0  . fluticasone (FLONASE) 50 MCG/ACT nasal spray Place 2 sprays into both nostrils daily. 16 g 1  . gabapentin (NEURONTIN) 300 MG capsule Take 1 capsule (300 mg total) by mouth 4 (four) times daily. 120 capsule 3  . HYDROcodone-acetaminophen (NORCO) 5-325 MG tablet Take 1 tablet by mouth every 6 (six) hours as needed for moderate pain. 1 tab po q 6 hours as needed severe pain. 30 tablet 0  . methocarbamol (ROBAXIN) 500 MG tablet TAKE 1 TABLET BY MOUTH AT BEDTIME 30 tablet 0  . methylPREDNISolone (MEDROL) 4 MG tablet 5 TAB PO DAY 1. 4 TAB PO DAY 2 3 TAB PO DAY 3,  2 TAB PO DAY 4 1 TAB PO DAY 5 15 tablet 0  . metroNIDAZOLE (FLAGYL) 500 MG tablet Take 1 tablet (500 mg total) by mouth 3 (three) times daily. 21 tablet 0  . mupirocin ointment (BACTROBAN) 2 % Apply thin film twice daily 22 g 0  . polyethylene glycol (MIRALAX / GLYCOLAX) packet Take 17 g by mouth daily. 14 each 0  . promethazine (PHENERGAN) 12.5 MG tablet Take 1 tablet (12.5 mg total) by mouth every 8 (eight) hours as needed for nausea or vomiting. 6 tablet 0  . SUMAtriptan (IMITREX) 50 MG tablet One tab at onset of migraine - may repeat in 2 hrs if needed (max dose: 2 tabs/24 hrs or 15/month).  Please call [phone] to schedule appt for continued refills. 15 tablet 1  . traMADol (ULTRAM) 50 MG tablet 1 tab po q 8 hours as needed pain 90 tablet 0   No current facility-administered medications on file prior to visit.     BP (!) 102/45   Pulse 60   Temp 98.3 F (36.8 C) (Oral)   Resp 16    Ht 5\' 3"  (1.6 m)   Wt 163  lb 9.6 oz (74.2 kg)   SpO2 99%   BMI 28.98 kg/m       Objective:   Physical Exam  General Mental Status- Alert. General Appearance- Not in acute distress.   Skin General: Color- Normal Color. Moisture- Normal Moisture.  Neck Carotid Arteries- Normal color. Moisture- Normal Moisture. No thjyromegaly.  Chest and Lung Exam Auscultation: Breath Sounds:-Normal.  Cardiovascular Auscultation:Rythm- Regular. Murmurs & Other Heart Sounds:Auscultation of the heart reveals- No Murmurs.  Abdomen Inspection:-Inspeection Normal. Palpation/Percussion:Note:No mass. Palpation and Percussion of the abdomen reveal- Non Tender, Non Distended + BS, no rebound or guarding.   Neurologic Cranial Nerve exam:- CN III-XII intact(No nystagmus), symmetric smile. Strength:- 5/5 equal and symmetric strength both upper and lower extremities.  HEENT Head- Normal. Ear Auditory Canal - Left- Normal. Right - Normal.Tympanic Membrane- Left- Normal. Right- Normal. Eye Sclera/Conjunctiva- Left- Normal. Right- Normal. Nose & Sinuses Nasal Mucosa- Left-  Boggy and Congested. Right-  Boggy and  Congested.Bilateral no  maxillary and no  frontal sinus pressure. Mouth & Throat Lips: Upper Lip- Normal: no dryness, cracking, pallor, cyanosis, or vesicular eruption. Lower Lip-Normal: no dryness, cracking, pallor, cyanosis or vesicular eruption. Buccal Mucosa- Bilateral- No Aphthous ulcers. Oropharynx- No Discharge or Erythema. Tonsils: Characteristics- Bilateral- No Erythema or Congestion. Size/Enlargement- Bilateral- No enlargement. Discharge- bilateral-None.  Lymphatic Head & Neck General Head & Neck Lymphatics: Bilateral: Description- No Localized lymphadenopathy.no supraclavicular lymph nodes.       Assessment & Plan:  We discussed your recent night sweats and history of recurrent fevers.  Would recommend at night that you do check your temperature and see if temperature  exceeding 99.6.  I reviewed work-up done in the past by myself before I sent you to a hematologist.  Also reviewed hematologist assessment/summary this visit.  MRI of the head was never done.  That may need to be repeated if today's work-up reveals no cause.  With a recent fatigue, chills, night sweats and history of tick bite, I ordered the below labs.  Also decided to add ammonia level since that was slightly increased last time.  With night sweats/potential hot flashes and slightly late menses, I did order FSH.  I will update you with test results.  Follow-up date to be determined after lab review.  40 minutes spent with patient today.  50% of time counseling patient on prior work-ups that were done and work-up that we will do today.  I explained to her that we might have to try to order MRI in the event that today's work-up is negative.

## 2018-04-23 NOTE — Patient Instructions (Signed)
We discussed your recent night sweats and history of recurrent fevers.  Would recommend at night that you do check your temperature and see if temperature exceeding 99.6.  I reviewed work-up done in the past by myself before I sent you to a hematologist.  Also reviewed hematologist assessment/summary this visit.  MRI of the head was never done.  That may need to be repeated if today's work-up reveals no cause.  With a recent fatigue, chills, night sweats and history of tick bite, I ordered the below labs.  Also decided to add ammonia level since that was slightly increased last time.  With night sweats/potential hot flashes and slightly late menses, I did order FSH.  I will update you with test results.  Follow-up date to be determined after lab review.

## 2018-04-23 NOTE — Telephone Encounter (Signed)
Ammonia level not drawn at lab visit today due to glitch in [employer]. Label printed incorrect specimen requirement. Attempted to notify pt that another lab appointment is needed for ammonia level and left detailed message for pt to return my call. [facility] to discuss and schedule lab appointment for pt. Future order for ammonia level has been entered.

## 2018-04-24 ENCOUNTER — Other Ambulatory Visit (INDEPENDENT_AMBULATORY_CARE_PROVIDER_SITE_OTHER): Payer: 59

## 2018-04-24 DIAGNOSIS — E7220 Disorder of urea cycle metabolism, unspecified: Secondary | ICD-10-CM | POA: Diagnosis not present

## 2018-04-24 LAB — VITAMIN B12: Vitamin B-12: 450 pg/mL (ref 211–911)

## 2018-04-24 LAB — AMMONIA: Ammonia: 30 umol/L (ref 11–35)

## 2018-04-25 ENCOUNTER — Encounter: Payer: Self-pay | Admitting: Medical

## 2018-04-25 LAB — QUANTIFERON-TB GOLD PLUS
Mitogen-NIL: >10 IU/mL
NIL: 0.03 IU/mL
QUANTIFERON-TB GOLD PLUS: NEGATIVE
TB1-NIL: 0.01 IU/mL
TB2-NIL: 0.03 [IU]/mL

## 2018-04-25 LAB — ROCKY MTN SPOTTED FVR ABS PNL(IGG+IGM)
RMSF IGM: NOT DETECTED
RMSF IgG: NOT DETECTED

## 2018-04-25 LAB — B. BURGDORFI ANTIBODIES: B burgdorferi Ab IgG+IgM: <0.9 index

## 2018-04-25 LAB — T4: T4, Total: 7.5 ug/dL (ref 5.1–11.9)

## 2018-05-13 ENCOUNTER — Other Ambulatory Visit: Payer: Self-pay | Admitting: Medical

## 2018-05-14 MED ORDER — TRAMADOL HCL 50 MG PO TABS: ORAL_TABLET | ORAL | 0 refills | Status: DC

## 2018-05-14 NOTE — Telephone Encounter (Signed)
Rx tramadol sent to pt pharmacy. 

## 2018-06-08 ENCOUNTER — Other Ambulatory Visit: Payer: Self-pay | Admitting: Medical

## 2018-06-18 ENCOUNTER — Other Ambulatory Visit: Payer: Self-pay | Admitting: Medical

## 2018-06-18 ENCOUNTER — Encounter: Payer: Self-pay | Admitting: Medical

## 2018-06-18 MED ORDER — TRAMADOL HCL 50 MG PO TABS: ORAL_TABLET | ORAL | 0 refills | Status: DC

## 2018-06-18 NOTE — Telephone Encounter (Signed)
Reviewed controlled med site. Pt is not on norco anymore. Up to date on uds and controlled med contract.Reviewed controlled med site today.

## 2018-07-15 ENCOUNTER — Other Ambulatory Visit: Payer: Self-pay | Admitting: Medical

## 2018-07-15 NOTE — Telephone Encounter (Signed)
Rx gabapentin refilled.

## 2018-07-24 ENCOUNTER — Other Ambulatory Visit: Payer: Self-pay | Admitting: Medical

## 2018-07-24 MED ORDER — TRAMADOL HCL 50 MG PO TABS: ORAL_TABLET | ORAL | 0 refills | Status: DC

## 2018-07-24 NOTE — Telephone Encounter (Signed)
Refill Request: Tramadol   Last RX:06/18/18 Last OV: 04/23/18 Next OV: None Scheduled  UDS:03/06/18 CSC:03/06/18 CSR:

## 2018-07-24 NOTE — Telephone Encounter (Signed)
Pt has to have controlled med visit next month before further tramadol can be prescribed. Refilled med today. Set up virtual visit.

## 2018-07-30 ENCOUNTER — Telehealth: Payer: Self-pay | Admitting: *Deleted

## 2018-07-30 NOTE — Telephone Encounter (Signed)
Joanna Anderson -- pt last saw you 04/2018. She does not have any future appointments scheduled. When will she be due for next routine follow up?

## 2018-07-30 NOTE — Telephone Encounter (Signed)
June virtrual visit or office visit. On controlled med and in June would be 6 months so needs some sort of visit in early June.

## 2018-07-31 NOTE — Telephone Encounter (Signed)
Pt is very upset that she got this message. She says that nothing is wrong with her and there is no reason that she should be coming in and she will not come in. Assured pt that if she is fine and has no urgent issues that we will not expect her to have a virtual or come in unless her dr felt it was absolutely necessary and if she is fine that she can make that decision, we will not expect her for the visit. She said that she will NOT be doing this visit no matter.

## 2018-07-31 NOTE — Telephone Encounter (Signed)
Spoke with pt and explained to her the 6 month follow up is for her controled medication and to get her refills. Pt voiced understanding and scheduled vov 09/03/18

## 2018-07-31 NOTE — Telephone Encounter (Signed)
Left detailed message on voicemail to call the office and schedule a Virtual Visit in June with Joanna Anderson and we can explain the process once she schedules the appointment.

## 2018-08-17 ENCOUNTER — Other Ambulatory Visit: Payer: Self-pay | Admitting: Medical

## 2018-08-18 NOTE — Telephone Encounter (Signed)
rx gabapentin sent to pt pharmacy. She needs appointment by end of this week for controlled medication visit to refill her tramadol. I saw note in computer she was upset but you later explained. Get her scheduled for virtual visit. I can't refill tramadol unless we have that visit. She will be due on 08/23/2018.

## 2018-08-18 NOTE — Telephone Encounter (Signed)
Pt requesting 90 supply of Gabapentin. Please advise.

## 2018-09-01 ENCOUNTER — Encounter: Payer: Self-pay | Admitting: Medical

## 2018-09-02 ENCOUNTER — Other Ambulatory Visit: Payer: Self-pay | Admitting: Medical

## 2018-09-03 ENCOUNTER — Ambulatory Visit (INDEPENDENT_AMBULATORY_CARE_PROVIDER_SITE_OTHER): Payer: 59 | Admitting: Medical

## 2018-09-03 ENCOUNTER — Telehealth: Payer: Self-pay | Admitting: Medical

## 2018-09-03 ENCOUNTER — Encounter: Payer: Self-pay | Admitting: Medical

## 2018-09-03 ENCOUNTER — Other Ambulatory Visit: Payer: Self-pay

## 2018-09-03 DIAGNOSIS — L309 Dermatitis, unspecified: Secondary | ICD-10-CM

## 2018-09-03 DIAGNOSIS — M797 Fibromyalgia: Secondary | ICD-10-CM

## 2018-09-03 DIAGNOSIS — G629 Polyneuropathy, unspecified: Secondary | ICD-10-CM | POA: Diagnosis not present

## 2018-09-03 DIAGNOSIS — G43809 Other migraine, not intractable, without status migrainosus: Secondary | ICD-10-CM

## 2018-09-03 MED ORDER — SUMATRIPTAN SUCCINATE 50 MG PO TABS: 50.0000 mg | ORAL_TABLET | ORAL | 2 refills | Status: AC | PRN

## 2018-09-03 MED ORDER — TRAMADOL HCL 50 MG PO TABS: ORAL_TABLET | ORAL | 0 refills | Status: DC

## 2018-09-03 NOTE — Patient Instructions (Addendum)
For fibromyalgia and neuropathy pain will continue with tramadol and gabapentin. Up to date on uds. Narx site reviewed. Also up to date on controlled med contract. Recommended some exercise for fibromyalgia pain and discussed other conservative options.  For migraine ha hx. Refilling your imitrex.  For probable eczema on face advised moisturize are twice daily and can use low potency hydrocortisone one time daily every other day.(thin film and only to affected area).  Follow up date 3rd or 4th week august for wellness exam

## 2018-09-03 NOTE — Telephone Encounter (Signed)
LVM FOR PT TO CALL AND SCHEDULE WELLNESS EXAM AT EARLIEST CONVENIENCE

## 2018-09-03 NOTE — Progress Notes (Signed)
Subjective:    Patient ID: Joanna Anderson, female    DOB: Oct 31, 1965, 53 y.o.   MRN: [id number]  HPI   Virtual Visit via Video Note  I connected with Joanna Anderson on 09/03/18 at  8:20 AM EDT by a video enabled telemedicine application and verified that I am speaking with the correct person using two identifiers.  Location: Patient: home Provider: home   I discussed the limitations of evaluation and management by telemedicine and the availability of in person appointments. The patient expressed understanding and agreed to proceed.  History of Present Illness:   Pt states she is doing ok overall. Pt working for [employer]. She has been working virtually.  Pt needs refill of tramadol. She has fibromyalgia. Symptoms have been controlled but still present. Pt has tried some essential oils and some probiotics. Last flare of pain about 2-3 weeks ago. Pt states she has been taking probiotics with theory of gut brain connection. Pt tried to cut back tramadol to twice daily but states pain worsened.  Pt mentioned she had migraine ha that last 2-3 days. She states seems to correlate most recent HA with eating banana. No ha recently/since. She states nause and light sensitivity when had ha. Pt has imitrex but on last ha did not take medication early enough.  Pt periods are less frequent. Her fsh was high. Pt prior sweating episodes have resolved.  Pt has faint slight rash on both sides on nares. Dry and flak with mild itch. Pt states maybe eczema.     Observations/Objective: General-no acute distress, pleasant, oriented. Lungs- on inspection lungs appear unlabored. Neck- no tracheal deviation or jvd on inspection. Neuro- gross motor function appears intact.   Past Medical History:  Diagnosis Date  . Anemia   . Anxiety   . ASD (atrial septal defect) 06/11/2014   S/p patch repair  . Atherosclerosis of abdominal aorta (HCC) 06/11/2014   Age advanced atherosclerosis of the infrarenal  aorta  . Depression   . Dysmenorrhea   . Dysuria   . Encounter for long-term (current) use of other medications   . Fibromyalgia   . Headache   . Hematuria   . High cholesterol   . Left knee pain   . Migraine   . Other malaise and fatigue   . Pure hyperglyceridemia   . Routine general medical examination at a health care facility   . Shoulder pain   . Vitamin D deficiency   . Vitamin D deficiency   . VSD (ventricular septal defect), multiple 06/11/2014   S/p patch repair     Social History   Socioeconomic History  . Marital status: Married    Spouse name: Not on file  . Number of children: 4  . Years of education: College  . Highest education level: Not on file  Occupational History  . Occupation: Charity fundraiser    Comment: [employer]  Social Needs  . Financial resource strain: Not on file  . Food insecurity:    Worry: Not on file    Inability: Not on file  . Transportation needs:    Medical: Not on file    Non-medical: Not on file  Tobacco Use  . Smoking status: Never Smoker  . Smokeless tobacco: Never Used  Substance and Sexual Activity  . Alcohol use: No    Alcohol/week: 0.0 standard drinks  . Drug use: No  . Sexual activity: Not on file  Lifestyle  . Physical activity:    Days per  week: Not on file    Minutes per session: Not on file  . Stress: Not on file  Relationships  . Social connections:    Talks on phone: Not on file    Gets together: Not on file    Attends religious service: Not on file    Active member of club or organization: Not on file    Attends meetings of clubs or organizations: Not on file    Relationship status: Not on file  . Intimate partner violence:    Fear of current or ex partner: Not on file    Emotionally abused: Not on file    Physically abused: Not on file    Forced sexual activity: Not on file  Other Topics Concern  . Not on file  Social History Narrative   Lives at home with husband and children.   Right-handed.    Past Surgical  History:  Procedure Laterality Date  . c seaction    . CARDIAC SURGERY     as child  . CESAREAN SECTION    . KNEE ARTHROSCOPY W/ ACL RECONSTRUCTION AND HAMSTRING GRAFT Left   . vetricular hole      Family History  Problem Relation Age of Onset  . Depression Mother   . High blood pressure Mother   . Hyperlipidemia Mother   . Anxiety disorder Mother   . Hypertension Mother   . High blood pressure Father   . Hyperlipidemia Father   . Hypertension Father   . Crohn's disease Father   . Kidney disease Father   . Peripheral vascular disease Father   . Peripheral vascular disease Paternal Grandfather     Allergies  Allergen Reactions  . Banana Other (See Comments)    Headaches  . Nsaids Nausea Only and Other (See Comments)    Also caused stomach ulcers (can only tolerate in limited doses)  . Other Other (See Comments)    SSRI(s): Makes the patient feel "disconnected" Tricyclic antidepressants- Muscle spasms Sulfates- Itching  . Sulfa Antibiotics Hives    "gums peeled off"    Current Outpatient Medications on File Prior to Visit  Medication Sig Dispense Refill  . acetaminophen (TYLENOL) 650 MG CR tablet Take 650 mg by mouth every 8 (eight) hours as needed for pain. Reported on 07/01/2015    . Albuterol Sulfate (PROAIR RESPICLICK) 108 (90 Base) MCG/ACT AEPB Inhale 1-2 puffs into the lungs every 6 (six) hours. 1 each 0  . cephALEXin (KEFLEX) 500 MG capsule Take 1 capsule (500 mg total) by mouth 3 (three) times daily. 15 capsule 0  . clonazePAM (KLONOPIN) 0.5 MG tablet Take 1 tablet (0.5 mg total) by mouth 2 (two) times daily as needed for anxiety. 60 tablet 1  . famciclovir (FAMVIR) 500 MG tablet Take 1 tablet (500 mg total) by mouth 3 (three) times daily. 21 tablet 0  . fluticasone (FLONASE) 50 MCG/ACT nasal spray Place 2 sprays into both nostrils daily. 16 g 1  . gabapentin (NEURONTIN) 300 MG capsule TAKE 1 CAPSULE BY MOUTH FOUR TIMES A DAY 360 capsule 1  . methocarbamol  (ROBAXIN) 500 MG tablet TAKE 1 TABLET BY MOUTH AT BEDTIME 30 tablet 0  . methylPREDNISolone (MEDROL) 4 MG tablet 5 TAB PO DAY 1. 4 TAB PO DAY 2 3 TAB PO DAY 3,  2 TAB PO DAY 4 1 TAB PO DAY 5 15 tablet 0  . metroNIDAZOLE (FLAGYL) 500 MG tablet Take 1 tablet (500 mg total) by mouth 3 (three) times  daily. 21 tablet 0  . mupirocin ointment (BACTROBAN) 2 % Apply thin film twice daily 22 g 0  . polyethylene glycol (MIRALAX / GLYCOLAX) packet Take 17 g by mouth daily. 14 each 0  . promethazine (PHENERGAN) 12.5 MG tablet Take 1 tablet (12.5 mg total) by mouth every 8 (eight) hours as needed for nausea or vomiting. 6 tablet 0  . SUMAtriptan (IMITREX) 50 MG tablet One tab at onset of migraine - may repeat in 2 hrs if needed (max dose: 2 tabs/24 hrs or 15/month).  Please call [phone] to schedule appt for continued refills. 15 tablet 1  . traMADol (ULTRAM) 50 MG tablet 1 tab po q 8 hours as needed pain 90 tablet 0   No current facility-administered medications on file prior to visit.     There were no vitals taken for this visit.   Assessment and Plan: For fibromyalgia and neuropathy pain will continue with tramadol and gabapentin. Up to date on uds. Narx site reviewed. Also up to date on controlled med contract. Recommended some exercise for fibromyalgia pain and discussed other conservative options.  For migraine ha hx. Refilling your imitrex.  For probable eczema on face advised moisturize are twice daily and can use low potency hydrocortisone one time daily every other day.(thin film and only to affected area).  Follow up date 3rd or 4th week august for wellness exam  Follow Up Instructions:    I discussed the assessment and treatment plan with the patient. The patient was provided an opportunity to ask questions and all were answered. The patient agreed with the plan and demonstrated an understanding of the instructions.   The patient was advised to call back or seek an in-person  evaluation if the symptoms worsen or if the condition fails to improve as anticipated.  25 minutes spent with pt. 50% of time spent on discussing plan on dx going forward on today diagnosis. Majority of that time counseling/discussion was on fibromyalgia.   Esperanza Richters, PA-C   Review of Systems  Constitutional: Negative for chills, fatigue and fever.  HENT: Negative for congestion, drooling, ear pain, facial swelling, nosebleeds and postnasal drip.   Respiratory: Negative for cough, chest tightness, shortness of breath and wheezing.   Cardiovascular: Negative for chest pain and palpitations.  Gastrointestinal: Negative for abdominal pain, blood in stool, diarrhea and vomiting.  Genitourinary: Negative for dysuria.  Musculoskeletal: Negative for back pain.  Skin: Negative for rash.  Neurological: Negative for dizziness, syncope, speech difficulty, weakness, numbness and headaches.       No ha presently.  Hematological: Negative for adenopathy. Does not bruise/bleed easily.  Psychiatric/Behavioral: Positive for dysphoric mood. Negative for behavioral problems, decreased concentration and sleep disturbance. The patient is not nervous/anxious.        At times mood decreases when fibro pain flares.       Objective:   Physical Exam        Assessment & Plan:

## 2018-10-01 ENCOUNTER — Ambulatory Visit (INDEPENDENT_AMBULATORY_CARE_PROVIDER_SITE_OTHER): Payer: 59 | Admitting: Medical

## 2018-10-01 ENCOUNTER — Other Ambulatory Visit: Payer: Self-pay

## 2018-10-01 DIAGNOSIS — F419 Anxiety disorder, unspecified: Secondary | ICD-10-CM | POA: Diagnosis not present

## 2018-10-01 DIAGNOSIS — F32A Depression, unspecified: Secondary | ICD-10-CM

## 2018-10-01 DIAGNOSIS — N951 Menopausal and female climacteric states: Secondary | ICD-10-CM | POA: Diagnosis not present

## 2018-10-01 DIAGNOSIS — F329 Major depressive disorder, single episode, unspecified: Secondary | ICD-10-CM

## 2018-10-01 DIAGNOSIS — M25551 Pain in right hip: Secondary | ICD-10-CM | POA: Diagnosis not present

## 2018-10-01 MED ORDER — FLUOXETINE HCL 40 MG PO CAPS: 40.0000 mg | ORAL_CAPSULE | Freq: Every day | ORAL | 3 refills | Status: DC

## 2018-10-01 NOTE — Patient Instructions (Addendum)
For recent depression and anxiety, I rx'd prozac as you used that in the past and did well. Can offer counseling virtual with behavioral services.  Use clonopin as needed for anxiety/severe as needed  For increased fsh can refer you to gyn if you want if menses/bleeding heavy or later when menses stops could get opinion on hormone supplemention.(currently pt declines)   Continue tramadol  for fibromyalgia. Rx advisement and reminded on serotonin side effects(syndrome). Gave education sheet to pt in past.   Follow up 3 weeks video visit.

## 2018-10-01 NOTE — Progress Notes (Signed)
Subjective:    Patient ID: Joanna Anderson, female    DOB: 12/17/1965, 53 y.o.   MRN: [id number]  HPI  Virtual Visit via Video Note  I connected with Joanna Anderson on 10/01/18 at  1:00 PM EDT by a video enabled telemedicine application and verified that I am speaking with the correct person using two identifiers.  Location: Patient: home Provider: office     I discussed the limitations of evaluation and management by telemedicine and the availability of in person appointments. The patient expressed understanding and agreed to proceed.  History of Present Illness:  Pt states she has been having some depression/decreased mood. She states some recent stress going on. She characterizes depression as moderate-severe. She is going through menopause. She states in past after daughter birth years ago she had depression.  Pt uses occasional uses clonopin and it does help a lot for severe anxiety.  Pt states she had some brief heavy menstrual cycles.  She is feeling anxious and hopeless.  Also after last visit family friend committed suicide. Pt knows family.Also close another family friend passed.  Pt feels like she can't do normal acitivites that relax her due to covid fears.   Pt job is going well. She is getting more responsibility due to her competence.   She is trying to take a vacation.  Some depression in past related to fibromyalgia.   Also some recent left hip pain for about one week. She was squatted down and lost balance. Hit concrete. Ever since has anterior hip area pan.     Observations/Objective: General-no acute distress, pleasant, oriented. Lungs- on inspection lungs appear unlabored. Neck- no tracheal deviation or jvd on inspection. Neuro- gross motor function appears intact.    Assessment and Plan: For recent depression and anxiety, I rx'd prozac as you used that in the past and did well. Can offer counseling virtual with behavioral services.  Use  clonopin as needed for anxiety/severe as needed  For increased fsh can refer you to gyn if you want if menses/bleeding heavy or later when menses stops could get opinion on hormone supplemention.(currently pt declines)   Continue tramadol  for fibromyalgia. Rx advisement and reminded on serotonin side effects.(syndrome). Gave education sheet to pt in past.   Follow up  Weeks video visit or as needed  Follow Up Instructions:    I discussed the assessment and treatment plan with the patient. The patient was provided an opportunity to ask questions and all were answered. The patient agreed with the plan and demonstrated an understanding of the instructions.   The patient was advised to call back or seek an in-person evaluation if the symptoms worsen or if the condition fails to improve as anticipated.  I provided 25  minutes of non-face-to-face time during this encounter.   Esperanza RichtersEdward Vitalia Stough, PA-C   Review of Systems  Constitutional: Negative for chills, fatigue and fever.  Respiratory: Negative for cough, chest tightness, shortness of breath and wheezing.   Cardiovascular: Negative for chest pain and palpitations.  Gastrointestinal: Negative for abdominal pain.  Musculoskeletal: Negative for back pain and myalgias.  Neurological: Negative for dizziness, speech difficulty, weakness and headaches.  Hematological: Negative for adenopathy. Does not bruise/bleed easily.  Psychiatric/Behavioral: Positive for dysphoric mood. Negative for behavioral problems, decreased concentration, sleep disturbance and suicidal ideas. The patient is nervous/anxious.    Past Medical History:  Diagnosis Date  . Anemia   . Anxiety   . ASD (atrial septal defect) 06/11/2014  S/p patch repair  . Atherosclerosis of abdominal aorta (HCC) 06/11/2014   Age advanced atherosclerosis of the infrarenal aorta  . Depression   . Dysmenorrhea   . Dysuria   . Encounter for long-term (current) use of other medications    . Fibromyalgia   . Headache   . Hematuria   . High cholesterol   . Left knee pain   . Migraine   . Other malaise and fatigue   . Pure hyperglyceridemia   . Routine general medical examination at a health care facility   . Shoulder pain   . Vitamin D deficiency   . Vitamin D deficiency   . VSD (ventricular septal defect), multiple 06/11/2014   S/p patch repair     Social History   Socioeconomic History  . Marital status: Married    Spouse name: Not on file  . Number of children: 4  . Years of education: College  . Highest education level: Not on file  Occupational History  . Occupation: Charity fundraiserN    Comment: [employer]  Social Needs  . Financial resource strain: Not on file  . Food insecurity    Worry: Not on file    Inability: Not on file  . Transportation needs    Medical: Not on file    Non-medical: Not on file  Tobacco Use  . Smoking status: Never Smoker  . Smokeless tobacco: Never Used  Substance and Sexual Activity  . Alcohol use: No    Alcohol/week: 0.0 standard drinks  . Drug use: No  . Sexual activity: Not on file  Lifestyle  . Physical activity    Days per week: Not on file    Minutes per session: Not on file  . Stress: Not on file  Relationships  . Social Musicianconnections    Talks on phone: Not on file    Gets together: Not on file    Attends religious service: Not on file    Active member of club or organization: Not on file    Attends meetings of clubs or organizations: Not on file    Relationship status: Not on file  . Intimate partner violence    Fear of current or ex partner: Not on file    Emotionally abused: Not on file    Physically abused: Not on file    Forced sexual activity: Not on file  Other Topics Concern  . Not on file  Social History Narrative   Lives at home with husband and children.   Right-handed.    Past Surgical History:  Procedure Laterality Date  . c seaction    . CARDIAC SURGERY     as child  . CESAREAN SECTION    . KNEE  ARTHROSCOPY W/ ACL RECONSTRUCTION AND HAMSTRING GRAFT Left   . vetricular hole      Family History  Problem Relation Age of Onset  . Depression Mother   . High blood pressure Mother   . Hyperlipidemia Mother   . Anxiety disorder Mother   . Hypertension Mother   . High blood pressure Father   . Hyperlipidemia Father   . Hypertension Father   . Crohn's disease Father   . Kidney disease Father   . Peripheral vascular disease Father   . Peripheral vascular disease Paternal Grandfather     Allergies  Allergen Reactions  . Banana Other (See Comments)    Headaches  . Nsaids Nausea Only and Other (See Comments)    Also caused stomach ulcers (can  only tolerate in limited doses)  . Other Other (See Comments)    SSRI(s): Makes the patient feel "disconnected" Tricyclic antidepressants- Muscle spasms Sulfates- Itching  . Sulfa Antibiotics Hives    "gums peeled off"    Current Outpatient Medications on File Prior to Visit  Medication Sig Dispense Refill  . acetaminophen (TYLENOL) 650 MG CR tablet Take 650 mg by mouth every 8 (eight) hours as needed for pain. Reported on 07/01/2015    . Albuterol Sulfate (PROAIR RESPICLICK) 935 (90 Base) MCG/ACT AEPB Inhale 1-2 puffs into the lungs every 6 (six) hours. 1 each 0  . cephALEXin (KEFLEX) 500 MG capsule Take 1 capsule (500 mg total) by mouth 3 (three) times daily. 15 capsule 0  . clonazePAM (KLONOPIN) 0.5 MG tablet Take 1 tablet (0.5 mg total) by mouth 2 (two) times daily as needed for anxiety. 60 tablet 1  . famciclovir (FAMVIR) 500 MG tablet Take 1 tablet (500 mg total) by mouth 3 (three) times daily. 21 tablet 0  . fluticasone (FLONASE) 50 MCG/ACT nasal spray Place 2 sprays into both nostrils daily. 16 g 1  . gabapentin (NEURONTIN) 300 MG capsule TAKE 1 CAPSULE BY MOUTH FOUR TIMES A DAY 360 capsule 1  . methocarbamol (ROBAXIN) 500 MG tablet TAKE 1 TABLET BY MOUTH AT BEDTIME 30 tablet 0  . methylPREDNISolone (MEDROL) 4 MG tablet 5 TAB PO DAY  1. 4 TAB PO DAY 2 3 TAB PO DAY 3,  2 TAB PO DAY 4 1 TAB PO DAY 5 15 tablet 0  . metroNIDAZOLE (FLAGYL) 500 MG tablet Take 1 tablet (500 mg total) by mouth 3 (three) times daily. 21 tablet 0  . mupirocin ointment (BACTROBAN) 2 % Apply thin film twice daily 22 g 0  . polyethylene glycol (MIRALAX / GLYCOLAX) packet Take 17 g by mouth daily. 14 each 0  . promethazine (PHENERGAN) 12.5 MG tablet Take 1 tablet (12.5 mg total) by mouth every 8 (eight) hours as needed for nausea or vomiting. 6 tablet 0  . SUMAtriptan (IMITREX) 50 MG tablet One tab at onset of migraine - may repeat in 2 hrs if needed (max dose: 2 tabs/24 hrs or 15/month).  Please call [phone] to schedule appt for continued refills. 15 tablet 1  . SUMAtriptan (IMITREX) 50 MG tablet Take 1 tablet (50 mg total) by mouth every 2 (two) hours as needed for migraine. May repeat in 2 hours if headache persists or recurs. 10 tablet 2  . traMADol (ULTRAM) 50 MG tablet 1 tab po q 8 hours as needed pain 90 tablet 0   No current facility-administered medications on file prior to visit.     There were no vitals taken for this visit.     Objective:   Physical Exam        Assessment & Plan:

## 2018-10-08 ENCOUNTER — Other Ambulatory Visit: Payer: Self-pay | Admitting: Medical

## 2018-10-09 MED ORDER — TRAMADOL HCL 50 MG PO TABS: ORAL_TABLET | ORAL | 0 refills | Status: DC

## 2018-10-09 NOTE — Telephone Encounter (Signed)
Sent rx already this morning?

## 2018-10-09 NOTE — Telephone Encounter (Signed)
Rx tramadol sent to pt pharmacy. 

## 2018-10-22 ENCOUNTER — Other Ambulatory Visit: Payer: Self-pay

## 2018-10-22 ENCOUNTER — Ambulatory Visit: Payer: Self-pay

## 2018-10-22 ENCOUNTER — Encounter: Payer: Self-pay | Admitting: Family Medicine

## 2018-10-22 ENCOUNTER — Ambulatory Visit (INDEPENDENT_AMBULATORY_CARE_PROVIDER_SITE_OTHER): Payer: 59 | Admitting: Medical

## 2018-10-22 ENCOUNTER — Telehealth: Payer: Self-pay | Admitting: Medical

## 2018-10-22 ENCOUNTER — Ambulatory Visit (INDEPENDENT_AMBULATORY_CARE_PROVIDER_SITE_OTHER): Payer: 59 | Admitting: Family Medicine

## 2018-10-22 VITALS — BP 121/65 | HR 74 | Ht 66.0 in | Wt 160.0 lb

## 2018-10-22 DIAGNOSIS — F419 Anxiety disorder, unspecified: Secondary | ICD-10-CM | POA: Diagnosis not present

## 2018-10-22 DIAGNOSIS — S73199A Other sprain of unspecified hip, initial encounter: Secondary | ICD-10-CM | POA: Insufficient documentation

## 2018-10-22 DIAGNOSIS — F32A Depression, unspecified: Secondary | ICD-10-CM

## 2018-10-22 DIAGNOSIS — F329 Major depressive disorder, single episode, unspecified: Secondary | ICD-10-CM | POA: Diagnosis not present

## 2018-10-22 DIAGNOSIS — M25552 Pain in left hip: Secondary | ICD-10-CM

## 2018-10-22 DIAGNOSIS — M797 Fibromyalgia: Secondary | ICD-10-CM | POA: Diagnosis not present

## 2018-10-22 DIAGNOSIS — G629 Polyneuropathy, unspecified: Secondary | ICD-10-CM

## 2018-10-22 MED ORDER — GABAPENTIN 300 MG PO CAPS: ORAL_CAPSULE | ORAL | 1 refills | Status: DC

## 2018-10-22 MED ORDER — TRIAMCINOLONE ACETONIDE 40 MG/ML IJ SUSP
40.0000 mg | Freq: Once | INTRAMUSCULAR | Status: AC
  Administered 2018-10-22: 40 mg via INTRA_ARTICULAR

## 2018-10-22 NOTE — Progress Notes (Signed)
Medication Samples have been provided to the patient.  Drug name: Duexis       Strength: 800mg /26.6mg         Qty: 1 Box  LOT: [id number]  Exp.Date: 02/2020  Dosing instructions: Take 1 tablet by mouth three (3) times a day.  The patient has been instructed regarding the correct time, dose, and frequency of taking this medication, including desired effects and most common side effects.   Sherrie George, [place] 3:53 PM 10/22/2018

## 2018-10-22 NOTE — Patient Instructions (Signed)
I am glad that you are mood and anxiety is improved with recent use of Prozac.  Also glad that you are still using the clonazepam sparingly for only rare severe anxiety.  Will continue this regimen if mood or anxiety worsens let me know.  For left hip pain, I did go ahead and refer her to sports medicine.  Will get their opinion as to the cause of pain.  Considering possible trochanteric bursitis.  Insomnia seems mostly related to left hip pain presently.  But going forward if insomnia not associated with hip pain and if only occurring sporadically then could offer medication such as hydroxyzine.  Would avoid Ambien since already on clonazepam and would avoid trazodone since might overstimulate serotonin system in light of other medications.  For fibromyalgia continue current medication regimen.  History of some neuropathy type pain.  Went ahead and refilled the gabapentin.  Follow-up August for CPE if needed.  If no obvious appointment then recommend follow-up in 4 to 5 months provided everything going smoothly or sooner if needed.

## 2018-10-22 NOTE — Patient Instructions (Signed)
Nice to meet you Please try ice over the area Please try the medicine if you still have pain after the injection  Please try the exercises   Please send me a message in MyChart with any questions or updates.  Please see me back in 4 weeks.   --Dr. Raeford Razor

## 2018-10-22 NOTE — Progress Notes (Signed)
Joanna BuffyDeborah S Anderson - 53 y.o. female MRN [id number]  Date of birth: Aug 12, 1965  SUBJECTIVE:  Including CC & ROS.  Chief Complaint  Patient presents with  . Hip Pain    left hip x 3 weeks    Joanna BuffyDeborah S Baehr is a 53 y.o. female that is presenting with left hip pain.  The pain is been ongoing for 3 weeks.  She fell on her left side and has had pain since that time.  The pain is occurring in the groin.  The pain is sharp and stabbing in nature.  She feels it with certain movements.  It is intermittent and ranges from moderate to severe.  She denies any history of surgery or previous hip pain.  Is localized to this area.  No improvement with modalities to date..  Independent review of the AP of the pelvis from 2019 shows no significant degenerative changes of the right hip.   Review of Systems  Constitutional: Negative for fever.  HENT: Negative for congestion.   Respiratory: Negative for cough.   Cardiovascular: Negative for chest pain.  Gastrointestinal: Negative for abdominal pain.  Musculoskeletal: Negative for joint swelling.  Skin: Negative for color change.  Neurological: Negative for weakness.  Hematological: Negative for adenopathy.    HISTORY: Past Medical, Surgical, Social, and Family History Reviewed & Updated per EMR.   Pertinent Historical Findings include:  Past Medical History:  Diagnosis Date  . Anemia   . Anxiety   . ASD (atrial septal defect) 06/11/2014   S/p patch repair  . Atherosclerosis of abdominal aorta (HCC) 06/11/2014   Age advanced atherosclerosis of the infrarenal aorta  . Depression   . Dysmenorrhea   . Dysuria   . Encounter for long-term (current) use of other medications   . Fibromyalgia   . Headache   . Hematuria   . High cholesterol   . Left knee pain   . Migraine   . Other malaise and fatigue   . Pure hyperglyceridemia   . Routine general medical examination at a health care facility   . Shoulder pain   . Vitamin D deficiency   . Vitamin D  deficiency   . VSD (ventricular septal defect), multiple 06/11/2014   S/p patch repair    Past Surgical History:  Procedure Laterality Date  . c seaction    . CARDIAC SURGERY     as child  . CESAREAN SECTION    . KNEE ARTHROSCOPY W/ ACL RECONSTRUCTION AND HAMSTRING GRAFT Left   . vetricular hole      Allergies  Allergen Reactions  . Banana Other (See Comments)    Headaches  . Nsaids Nausea Only and Other (See Comments)    Also caused stomach ulcers (can only tolerate in limited doses)  . Other Other (See Comments)    SSRI(s): Makes the patient feel "disconnected" Tricyclic antidepressants- Muscle spasms Sulfates- Itching  . Sulfa Antibiotics Hives    "gums peeled off"    Family History  Problem Relation Age of Onset  . Depression Mother   . High blood pressure Mother   . Hyperlipidemia Mother   . Anxiety disorder Mother   . Hypertension Mother   . High blood pressure Father   . Hyperlipidemia Father   . Hypertension Father   . Crohn's disease Father   . Kidney disease Father   . Peripheral vascular disease Father   . Peripheral vascular disease Paternal Grandfather      Social History   Socioeconomic History  .  Marital status: Married    Spouse name: Not on file  . Number of children: 4  . Years of education: College  . Highest education level: Not on file  Occupational History  . Occupation: Therapist, sports    Comment: [employer]  Social Needs  . Financial resource strain: Not on file  . Food insecurity    Worry: Not on file    Inability: Not on file  . Transportation needs    Medical: Not on file    Non-medical: Not on file  Tobacco Use  . Smoking status: Never Smoker  . Smokeless tobacco: Never Used  Substance and Sexual Activity  . Alcohol use: No    Alcohol/week: 0.0 standard drinks  . Drug use: No  . Sexual activity: Not on file  Lifestyle  . Physical activity    Days per week: Not on file    Minutes per session: Not on file  . Stress: Not on file   Relationships  . Social Herbalist on phone: Not on file    Gets together: Not on file    Attends religious service: Not on file    Active member of club or organization: Not on file    Attends meetings of clubs or organizations: Not on file    Relationship status: Not on file  . Intimate partner violence    Fear of current or ex partner: Not on file    Emotionally abused: Not on file    Physically abused: Not on file    Forced sexual activity: Not on file  Other Topics Concern  . Not on file  Social History Narrative   Lives at home with husband and children.   Right-handed.     PHYSICAL EXAM:  VS: BP 121/65   Pulse 74   Ht 5\' 6"  (1.676 m)   Wt 160 lb (72.6 kg)   BMI 25.82 kg/m  Physical Exam Gen: NAD, alert, cooperative with exam, well-appearing ENT: normal lips, normal nasal mucosa,  Eye: normal EOM, normal conjunctiva and lids CV:  no edema, +2 pedal pulses   Resp: no accessory muscle use, non-labored,  Skin: no rashes, no areas of induration  Neuro: normal tone, normal sensation to touch Psych:  normal insight, alert and oriented MSK:  Left hip: Tenderness palpation of the greater trochanter. Normal internal and external rotation. Normal strength resistance with hip flexion. Normal flexion and extension of the knee.  Normal strength resistance. Negative straight leg raise. Positive FADIR Normal FABER.  NVI.    Aspiration/Injection Procedure Note ASAL TEAS 1965-04-10  Procedure: Injection Indications: Left hip pain  Procedure Details Consent: Risks of procedure as well as the alternatives and risks of each were explained to the (patient/caregiver).  Consent for procedure obtained. Time Out: Verified patient identification, verified procedure, site/side was marked, verified correct patient position, special equipment/implants available, medications/allergies/relevent history reviewed, required imaging and test results available.  Performed.   The area was cleaned with iodine and alcohol swabs.    The left hip joint was injected using 1 cc's of 40 milligrams Kenalog and 4 cc's of 0.25% bupivacaine with a 22 3 1/2" needle.  Ultrasound was used. Images were obtained in long views showing the injection.     A sterile dressing was applied.  Patient did tolerate procedure well.       ASSESSMENT & PLAN:   Left hip pain Had a fall and landed on the lateral aspect. Pain seems related to the joint.  Possible for labral irritation. Has pain with IR but good passive movement.  - injection  - counseled on HEP and supportive care - provided duexis sample - if no improvement consider PT or imaging.

## 2018-10-22 NOTE — Telephone Encounter (Signed)
Hip xray placed

## 2018-10-22 NOTE — Assessment & Plan Note (Signed)
Had a fall and landed on the lateral aspect. Pain seems related to the joint. Possible for labral irritation. Has pain with IR but good passive movement.  - injection  - counseled on HEP and supportive care - provided duexis sample - if no improvement consider PT or imaging.

## 2018-10-22 NOTE — Progress Notes (Addendum)
Subjective:    Patient ID: Joanna Anderson, female    DOB: 1966-03-24, 53 y.o.   MRN: [id number]  HPI  Virtual Visit via Video Note  I connected with Joanna Anderson on 10/22/18 at  8:20 AM EDT by a video enabled telemedicine application and verified that I am speaking with the correct person using two identifiers.  Location: Patient: home Provider: office    I discussed the limitations of evaluation and management by telemedicine and the availability of in person appointments. The patient expressed understanding and agreed to proceed.  History of Present Illness: Follow up from last visit for recent depression and anxiety, I rx'd prozac as you used that in the past and did well. I  offered counseling virtual with behavioral services. Pt states her overall mood is a lot better.   Use clonopin as needed for anxiety/severe as needed. Anxiety is present just little bit. Not severe. Pt states has not used clonapin since last visit.  Mild insomnia related to left hip pain. Took tylenol last 2 nights and slept well. Pain laying on left side. Lateral aspect tender. January her xray was negative. Pain now for about 3 weeks. It is gradually getting better. I offered pt referral to sports med if she wants.  Continue tramadol  for fibromyalgia. Rx advisement and reminded on serotonin side effects(syndrome). Gave education sheet to pt in past.     Observations/Objective: General-no acute distress, pleasant, oriented. Lungs- on inspection lungs appear unlabored. Neck- no tracheal deviation or jvd on inspection. Neuro- gross motor function appears intact.  Left hip- pain on sitting and on self palpation.    Assessment and Plan: I am glad that you are mood and anxiety is improved with recent use of Prozac.  Also glad that you are still using the clonazepam sparingly for only rare severe anxiety.  Will continue this regimen if mood or anxiety worsens let me know.  For left hip pain, I did  go ahead and refer her to sports medicine.  Will get their opinion as to the cause of pain.  Considering possible trochanteric bursitis.  Insomnia seems mostly related to left hip pain presently.  But going forward if insomnia not associated with hip pain and if only occurring sporadically then could offer medication such as hydroxyzine.  Would avoid Ambien since already on clonazepam and would avoid trazodone since might overstimulate serotonin system in light of other medications.  Xray ordered today of hip.  For fibromyalgia continue current medication regimen.  History of some neuropathy type pain.  Went ahead and refilled the gabapentin.  Follow-up August for CPE if needed.  If no obvious appointment then recommend follow-up in 4 to 5 months provided everything going smoothly or sooner if needed.  20 +minutes spent with pt.    Esperanza RichtersEdward Maizy Davanzo, PA-C  Follow Up Instructions:    I discussed the assessment and treatment plan with the patient. The patient was provided an opportunity to ask questions and all were answered. The patient agreed with the plan and demonstrated an understanding of the instructions.   The patient was advised to call back or seek an in-person evaluation if the symptoms worsen or if the condition fails to improve as anticipated.  I provided 20 minutes of non-face-to-face time during this encounter.   Esperanza RichtersEdward Yarielys Beed, PA-C    Review of Systems  Constitutional: Negative for chills, fatigue and fever.  Respiratory: Negative for chest tightness, shortness of breath and wheezing.   Cardiovascular: Negative  for chest pain and palpitations.  Gastrointestinal: Negative for abdominal pain, nausea and vomiting.  Genitourinary: Negative for dysuria and pelvic pain.  Musculoskeletal: Negative for back pain and myalgias.       Left hip pain.  Psychiatric/Behavioral: Positive for dysphoric mood and sleep disturbance. Negative for behavioral problems. The patient is  nervous/anxious.        Both mood and anxiety are improved with Prozac.  Patient is using Klonopin very sparingly.  Occasional insomnia but more recently related to her left hip pain.    Past Medical History:  Diagnosis Date  . Anemia   . Anxiety   . ASD (atrial septal defect) 06/11/2014   S/p patch repair  . Atherosclerosis of abdominal aorta (HCC) 06/11/2014   Age advanced atherosclerosis of the infrarenal aorta  . Depression   . Dysmenorrhea   . Dysuria   . Encounter for long-term (current) use of other medications   . Fibromyalgia   . Headache   . Hematuria   . High cholesterol   . Left knee pain   . Migraine   . Other malaise and fatigue   . Pure hyperglyceridemia   . Routine general medical examination at a health care facility   . Shoulder pain   . Vitamin D deficiency   . Vitamin D deficiency   . VSD (ventricular septal defect), multiple 06/11/2014   S/p patch repair     Social History   Socioeconomic History  . Marital status: Married    Spouse name: Not on file  . Number of children: 4  . Years of education: College  . Highest education level: Not on file  Occupational History  . Occupation: Charity fundraiserN    Comment: [employer]  Social Needs  . Financial resource strain: Not on file  . Food insecurity    Worry: Not on file    Inability: Not on file  . Transportation needs    Medical: Not on file    Non-medical: Not on file  Tobacco Use  . Smoking status: Never Smoker  . Smokeless tobacco: Never Used  Substance and Sexual Activity  . Alcohol use: No    Alcohol/week: 0.0 standard drinks  . Drug use: No  . Sexual activity: Not on file  Lifestyle  . Physical activity    Days per week: Not on file    Minutes per session: Not on file  . Stress: Not on file  Relationships  . Social Musicianconnections    Talks on phone: Not on file    Gets together: Not on file    Attends religious service: Not on file    Active member of club or organization: Not on file    Attends  meetings of clubs or organizations: Not on file    Relationship status: Not on file  . Intimate partner violence    Fear of current or ex partner: Not on file    Emotionally abused: Not on file    Physically abused: Not on file    Forced sexual activity: Not on file  Other Topics Concern  . Not on file  Social History Narrative   Lives at home with husband and children.   Right-handed.    Past Surgical History:  Procedure Laterality Date  . c seaction    . CARDIAC SURGERY     as child  . CESAREAN SECTION    . KNEE ARTHROSCOPY W/ ACL RECONSTRUCTION AND HAMSTRING GRAFT Left   . vetricular hole  Family History  Problem Relation Age of Onset  . Depression Mother   . High blood pressure Mother   . Hyperlipidemia Mother   . Anxiety disorder Mother   . Hypertension Mother   . High blood pressure Father   . Hyperlipidemia Father   . Hypertension Father   . Crohn's disease Father   . Kidney disease Father   . Peripheral vascular disease Father   . Peripheral vascular disease Paternal Grandfather     Allergies  Allergen Reactions  . Banana Other (See Comments)    Headaches  . Nsaids Nausea Only and Other (See Comments)    Also caused stomach ulcers (can only tolerate in limited doses)  . Other Other (See Comments)    SSRI(s): Makes the patient feel "disconnected" Tricyclic antidepressants- Muscle spasms Sulfates- Itching  . Sulfa Antibiotics Hives    "gums peeled off"    Current Outpatient Medications on File Prior to Visit  Medication Sig Dispense Refill  . acetaminophen (TYLENOL) 650 MG CR tablet Take 650 mg by mouth every 8 (eight) hours as needed for pain. Reported on 07/01/2015    . Albuterol Sulfate (PROAIR RESPICLICK) 182 (90 Base) MCG/ACT AEPB Inhale 1-2 puffs into the lungs every 6 (six) hours. 1 each 0  . clonazePAM (KLONOPIN) 0.5 MG tablet Take 1 tablet (0.5 mg total) by mouth 2 (two) times daily as needed for anxiety. 60 tablet 1  . FLUoxetine (PROZAC)  40 MG capsule Take 1 capsule (40 mg total) by mouth daily. 30 capsule 3  . fluticasone (FLONASE) 50 MCG/ACT nasal spray Place 2 sprays into both nostrils daily. 16 g 1  . SUMAtriptan (IMITREX) 50 MG tablet Take 1 tablet (50 mg total) by mouth every 2 (two) hours as needed for migraine. May repeat in 2 hours if headache persists or recurs. 10 tablet 2  . traMADol (ULTRAM) 50 MG tablet 1 tab po q 8 hours as needed pain 90 tablet 0   No current facility-administered medications on file prior to visit.     There were no vitals taken for this visit.      Objective:   Physical Exam        Assessment & Plan:

## 2018-10-23 ENCOUNTER — Other Ambulatory Visit: Payer: Self-pay | Admitting: Medical

## 2018-11-11 ENCOUNTER — Other Ambulatory Visit: Payer: Self-pay | Admitting: Medical

## 2018-11-12 ENCOUNTER — Encounter: Payer: Self-pay | Admitting: Medical

## 2018-11-12 ENCOUNTER — Other Ambulatory Visit: Payer: Self-pay | Admitting: Medical

## 2018-11-12 NOTE — Telephone Encounter (Addendum)
Refill Request: Tramadol  Last RX:10/09/18 Last OV:10/22/18 Next MO:QHUT scheduled  UDS:03/06/18 CSC:03/06/18 CSR:11/12/2018  Rx tramadol sent to pt pharmacy.

## 2018-11-19 ENCOUNTER — Ambulatory Visit: Payer: 59 | Admitting: Family Medicine

## 2018-11-25 ENCOUNTER — Ambulatory Visit (INDEPENDENT_AMBULATORY_CARE_PROVIDER_SITE_OTHER): Payer: 59 | Admitting: Family Medicine

## 2018-11-25 ENCOUNTER — Other Ambulatory Visit: Payer: Self-pay

## 2018-11-25 ENCOUNTER — Encounter: Payer: Self-pay | Admitting: Family Medicine

## 2018-11-25 VITALS — BP 110/70 | Ht 66.0 in | Wt 160.0 lb

## 2018-11-25 DIAGNOSIS — S73192A Other sprain of left hip, initial encounter: Secondary | ICD-10-CM
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
## 2018-11-25 NOTE — Patient Instructions (Signed)
Good to see you Please get the xrays that Joanna Anderson has ordered  I can see the MRI of the lumbar region and the CT Please send me the last couple of notes from the [place] office  Please send me a message in [place] with any questions or updates.  Please schedule an appointment after the MRI is completed. It can be virtual or in person.   --Dr. Raeford Razor
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
## 2018-12-01 ENCOUNTER — Encounter: Payer: Self-pay | Admitting: Medical

## 2018-12-05 ENCOUNTER — Other Ambulatory Visit: Payer: Self-pay | Admitting: Medical

## 2018-12-09 ENCOUNTER — Encounter: Payer: Self-pay | Admitting: Medical

## 2018-12-10 ENCOUNTER — Emergency Department (HOSPITAL_COMMUNITY)
Admission: EM | Admit: 2018-12-10 | Discharge: 2018-12-10 | Disposition: A | Discharge status: Home / Self Care | Payer: 59 | Attending: Emergency Medicine | Admitting: Emergency Medicine

## 2018-12-10 ENCOUNTER — Emergency Department (HOSPITAL_COMMUNITY): Payer: 59

## 2018-12-10 ENCOUNTER — Encounter (HOSPITAL_COMMUNITY): Payer: Self-pay | Admitting: Emergency Medicine

## 2018-12-10 ENCOUNTER — Ambulatory Visit (INDEPENDENT_AMBULATORY_CARE_PROVIDER_SITE_OTHER): Payer: 59 | Admitting: Medical

## 2018-12-10 ENCOUNTER — Other Ambulatory Visit: Payer: Self-pay

## 2018-12-10 DIAGNOSIS — Z79899 Other long term (current) drug therapy: Secondary | ICD-10-CM | POA: Diagnosis not present

## 2018-12-10 DIAGNOSIS — R0600 Dyspnea, unspecified: Secondary | ICD-10-CM | POA: Diagnosis not present

## 2018-12-10 DIAGNOSIS — R05 Cough: Secondary | ICD-10-CM | POA: Diagnosis not present

## 2018-12-10 DIAGNOSIS — R197 Diarrhea, unspecified: Secondary | ICD-10-CM | POA: Diagnosis not present

## 2018-12-10 DIAGNOSIS — Z20828 Contact with and (suspected) exposure to other viral communicable diseases: Secondary | ICD-10-CM | POA: Insufficient documentation

## 2018-12-10 DIAGNOSIS — R0602 Shortness of breath: Secondary | ICD-10-CM | POA: Insufficient documentation

## 2018-12-10 DIAGNOSIS — R6883 Chills (without fever): Secondary | ICD-10-CM | POA: Diagnosis not present

## 2018-12-10 DIAGNOSIS — R059 Cough, unspecified: Secondary | ICD-10-CM

## 2018-12-10 LAB — COMPREHENSIVE METABOLIC PANEL
ALT: 20 U/L (ref 0–44)
AST: 19 U/L (ref 15–41)
Albumin: 3.8 g/dL (ref 3.5–5.0)
Alkaline Phosphatase: 72 U/L (ref 38–126)
Anion gap: 8 (ref 5–15)
BUN: 17 mg/dL (ref 6–20)
CO2: 27 mmol/L (ref 22–32)
Calcium: 9 mg/dL (ref 8.9–10.3)
Chloride: 104 mmol/L (ref 98–111)
Creatinine, Ser: 0.97 mg/dL (ref 0.44–1.00)
GFR calc Af Amer: >60 mL/min (ref >60)
GFR calc non Af Amer: >60 mL/min (ref >60)
Glucose, Bld: 98 mg/dL (ref 70–99)
Potassium: 3.7 mmol/L (ref 3.5–5.1)
Sodium: 139 mmol/L (ref 135–145)
Total Bilirubin: 0.8 mg/dL (ref 0.3–1.2)
Total Protein: 7.5 g/dL (ref 6.5–8.1)

## 2018-12-10 LAB — LIPASE, BLOOD: Lipase: 25 U/L (ref 11–51)

## 2018-12-10 LAB — CBC WITH DIFFERENTIAL/PLATELET
Abs Immature Granulocytes: 0.01 10*3/uL (ref 0.00–0.07)
Basophils Absolute: 0 10*3/uL (ref 0.0–0.1)
Basophils Relative: 1 %
Eosinophils Absolute: 0.1 10*3/uL (ref 0.0–0.5)
Eosinophils Relative: 2 %
HCT: 39.1 % (ref 36.0–46.0)
Hemoglobin: 12.3 g/dL (ref 12.0–15.0)
Immature Granulocytes: 0 %
Lymphocytes Relative: 30 %
Lymphs Abs: 2.5 10*3/uL (ref 0.7–4.0)
MCH: 29 pg (ref 26.0–34.0)
MCHC: 31.5 g/dL (ref 30.0–36.0)
MCV: 92.2 fL (ref 80.0–100.0)
Monocytes Absolute: 0.7 10*3/uL (ref 0.1–1.0)
Monocytes Relative: 8 %
Neutro Abs: 4.9 10*3/uL (ref 1.7–7.7)
Neutrophils Relative %: 59 %
Platelets: 194 10*3/uL (ref 150–400)
RBC: 4.24 MIL/uL (ref 3.87–5.11)
RDW: 12.4 % (ref 11.5–15.5)
WBC: 8.3 10*3/uL (ref 4.0–10.5)
nRBC: 0 % (ref 0.0–0.2)

## 2018-12-10 MED ORDER — ALBUTEROL SULFATE HFA 108 (90 BASE) MCG/ACT IN AERS
4.0000 | INHALATION_SPRAY | Freq: Once | RESPIRATORY_TRACT | Status: AC
  Administered 2018-12-10: 4 via RESPIRATORY_TRACT
  Filled 2018-12-10: qty 6.7

## 2018-12-10 NOTE — ED Notes (Signed)
Pt ambulated in room and oxygen saturation did not drop below 94% throughout.

## 2018-12-10 NOTE — Discharge Instructions (Addendum)
Your labs were within normal limits today.  X-ray was negative.  You will be called with the results of your cardiac testing within the next 48 hours.  Please remain isolated until you find the results for this.  Your work note has been provided.  If you experience any chest pain, shortness of breath, worsening symptoms please return to the emergency department.

## 2018-12-10 NOTE — ED Triage Notes (Signed)
Patient c/o SOB with cough and diarrhea since yesterday. Reports husband is "assumed positive and at home very sick." States son " has Covid antibodies." Speaking in full sentences without difficulty in NAD.

## 2018-12-10 NOTE — Progress Notes (Signed)
Subjective:    Patient ID: Joanna Anderson, female    DOB: [birth year], 53 y.o.   MRN: [id number]  HPI  Virtual Visit via Video Note  I connected with Anastasia Tompson Triplett on 12/10/18 at  2:20 PM EDT by a video enabled telemedicine application and verified that I am speaking with the correct person using two identifiers.  Location: Patient: home Provider: office.   I discussed the limitations of evaluation and management by telemedicine and the availability of in person appointments. The patient expressed understanding and agreed to proceed.  Connect but video never showed up.  Audio works fine and waited for video to come through but never work adequately.  Patient does not have O2 sat monitor.  She did not check her vitals today.  History of Present Illness: Pt has virtual visit today  with reported concern that she might have covid.   Sports med MD that saw pt thought based on constellation of signs/symptoms she might have covid.  Pt my chart message read.   We are presumed positive for covid by the orthopedic doctor you referred me to. My husband and I have been very sick over the weekend. I have started easing up in the gastrointestinal issues and a little coughing a day or so ago and today I feel like I can't get a full breath. My husband has such horrible diarrhea for 4 days now and fever just started coughing a little. I was feeling better but I'm tired and a touch winded again but not too bad. I just don't feel well. I don't have enough space here to tell all the symptoms. Not much cough though and that is concerning with the way I feel. I'm just concerned. The last few days have been rough here. Do I need to make a video appointment or just wait it out?  Today is about day 7 for me. And day 5 for my husband. I never seen him this sick. I never seen anything like it.  So last night sent back response asking her to be seen by virtual visit.  Pt clarified today she has been feeling  fatigued and shortness of breath. Pt has plans to get o2 sat monitor. She states even walking short distance causes. Sob.  On Thursday had stomach ache, Friday severe fatigue with stomach ache and then Saturday had severe diarrhea. Sunday fever, chills and sweating. Monday states cough started with st. Yesterday afternoon had severe shortness of breath. So much considered calling ems. Pt is wheezing some. She has stethoscope and she thinks can here some crackles in her own lungs.  Pt has albuterol at home and she tried it yesterday and did not help much.  Presently even right now she feels short of breath.  Walking 15 -20 feet feels short of breath.       Observations/Objective:  General- no acute distress. Pleasant, oriented. Normal speech. She states laying in bed and feels some mild sob presently.   Assessment and Plan: Patient has constellation of various signs/symptoms that could indicate COVID infection.  Most troubling symptom presently is her reported dyspnea.  Patient does not have O2 sat monitor at her house.  Based on her overall description I do think it would be best for her to be evaluated in the emergency department this afternoon.  Patient states that she will go to [facility] long ED for evaluation.  He should send me her emergency department work-up evaluation and I will follow later.  I explained to patient the benefit of getting the work-up with early diagnosis.  Patient expressed understanding and will go shortly after virtual visit.  Follow-up date to be determined after ED evaluation.  25 minutes spent with pt today. 50% of time counseled on benefit of evaluation in ED today/need for quick work up.  Follow Up Instructions:    I discussed the assessment and treatment plan with the patient. The patient was provided an opportunity to ask questions and all were answered. The patient agreed with the plan and demonstrated an understanding of the instructions.   The patient  was advised to call back or seek an in-person evaluation if the symptoms worsen or if the condition fails to improve as anticipated.  I provided 25  minutes of non-face-to-face time during this encounter.   Esperanza RichtersEdward Tametria Aho, PA-C    Review of Systems  Constitutional: Positive for fatigue. Negative for chills, diaphoresis and fever.  Respiratory: Positive for cough, shortness of breath and wheezing. Negative for chest tightness.   Cardiovascular: Negative for chest pain and palpitations.  Gastrointestinal: Positive for diarrhea. Negative for abdominal pain, blood in stool, constipation and vomiting.  Musculoskeletal: Negative for back pain.  Skin: Negative for rash.  Neurological: Negative for dizziness, speech difficulty, weakness and light-headedness.  Hematological: Negative for adenopathy. Does not bruise/bleed easily.  Psychiatric/Behavioral: Negative for behavioral problems, hallucinations and sleep disturbance. The patient is not nervous/anxious.     Past Medical History:  Diagnosis Date  . Anemia   . Anxiety   . ASD (atrial septal defect) 06/11/2014   S/p patch repair  . Atherosclerosis of abdominal aorta (HCC) 06/11/2014   Age advanced atherosclerosis of the infrarenal aorta  . Depression   . Dysmenorrhea   . Dysuria   . Encounter for long-term (current) use of other medications   . Fibromyalgia   . Headache   . Hematuria   . High cholesterol   . Left knee pain   . Migraine   . Other malaise and fatigue   . Pure hyperglyceridemia   . Routine general medical examination at a health care facility   . Shoulder pain   . Vitamin D deficiency   . Vitamin D deficiency   . VSD (ventricular septal defect), multiple 06/11/2014   S/p patch repair     Social History   Socioeconomic History  . Marital status: Married    Spouse name: Not on file  . Number of children: 4  . Years of education: College  . Highest education level: Not on file  Occupational History  .  Occupation: Charity fundraiserN    Comment: [employer]  Social Needs  . Financial resource strain: Not on file  . Food insecurity    Worry: Not on file    Inability: Not on file  . Transportation needs    Medical: Not on file    Non-medical: Not on file  Tobacco Use  . Smoking status: Never Smoker  . Smokeless tobacco: Never Used  Substance and Sexual Activity  . Alcohol use: No    Alcohol/week: 0.0 standard drinks  . Drug use: No  . Sexual activity: Not on file  Lifestyle  . Physical activity    Days per week: Not on file    Minutes per session: Not on file  . Stress: Not on file  Relationships  . Social Musicianconnections    Talks on phone: Not on file    Gets together: Not on file    Attends religious service:  Not on file    Active member of club or organization: Not on file    Attends meetings of clubs or organizations: Not on file    Relationship status: Not on file  . Intimate partner violence    Fear of current or ex partner: Not on file    Emotionally abused: Not on file    Physically abused: Not on file    Forced sexual activity: Not on file  Other Topics Concern  . Not on file  Social History Narrative   Lives at home with husband and children.   Right-handed.    Past Surgical History:  Procedure Laterality Date  . c seaction    . CARDIAC SURGERY     as child  . CESAREAN SECTION    . KNEE ARTHROSCOPY W/ ACL RECONSTRUCTION AND HAMSTRING GRAFT Left   . vetricular hole      Family History  Problem Relation Age of Onset  . Depression Mother   . High blood pressure Mother   . Hyperlipidemia Mother   . Anxiety disorder Mother   . Hypertension Mother   . High blood pressure Father   . Hyperlipidemia Father   . Hypertension Father   . Crohn's disease Father   . Kidney disease Father   . Peripheral vascular disease Father   . Peripheral vascular disease Paternal Grandfather     Allergies  Allergen Reactions  . Banana Other (See Comments)    Headaches  . Nsaids Nausea Only  and Other (See Comments)    Also caused stomach ulcers (can only tolerate in limited doses)  . Other Other (See Comments)    SSRI(s): Makes the patient feel "disconnected" Tricyclic antidepressants- Muscle spasms Sulfates- Itching  . Sulfa Antibiotics Hives    "gums peeled off"    Current Outpatient Medications on File Prior to Visit  Medication Sig Dispense Refill  . acetaminophen (TYLENOL) 650 MG CR tablet Take 650 mg by mouth every 8 (eight) hours as needed for pain. Reported on 07/01/2015    . Albuterol Sulfate (PROAIR RESPICLICK) 108 (90 Base) MCG/ACT AEPB Inhale 1-2 puffs into the lungs every 6 (six) hours. 1 each 0  . clonazePAM (KLONOPIN) 0.5 MG tablet Take 1 tablet (0.5 mg total) by mouth 2 (two) times daily as needed for anxiety. 60 tablet 1  . FLUoxetine (PROZAC) 40 MG capsule TAKE 1 CAPSULE BY MOUTH EVERY DAY 90 capsule 2  . fluticasone (FLONASE) 50 MCG/ACT nasal spray SPRAY 2 SPRAYS INTO EACH NOSTRIL EVERY DAY 16 mL 1  . gabapentin (NEURONTIN) 300 MG capsule TAKE 1 CAPSULE BY MOUTH FOUR TIMES A DAY 360 capsule 1  . SUMAtriptan (IMITREX) 50 MG tablet Take 1 tablet (50 mg total) by mouth every 2 (two) hours as needed for migraine. May repeat in 2 hours if headache persists or recurs. 10 tablet 2  . traMADol (ULTRAM) 50 MG tablet TAKE 1 TABLET BY MOUTH EVERY 8 HOURS AS NEEDED FOR PAIN 90 tablet 0   No current facility-administered medications on file prior to visit.     There were no vitals taken for this visit.      Objective:   Physical Exam        Assessment & Plan:

## 2018-12-10 NOTE — ED Provider Notes (Signed)
[facility]-EMERGENCY DEPT Provider Note   CSN: [id number] Arrival date & time: 12/10/18  1609     History   Chief Complaint Chief Complaint  Patient presents with  . Shortness of Breath    HPI Joanna Anderson is a 53 y.o. female.     53 y.o female with a PMH of ASD, depression, migraine, presents to the ED with a chief complaint of body aches, shortness of breath x 1 week. Patient reports she began feeling ill last Monday, states she had body aches, nausea and diarrhea without blood. She aldo endorses a fever with a Tmax of 101 which she has been taking tylenol. She has tried using a home inhaler which helped some with her symptoms. She also endorses feeling like her symptoms worsen yesterday when she had an episode of "gasping" for air which she called her husband to help her. She states "I feel like I cannot cath my breath". She denies any chest pain, dizziness or prior history of blood clots.   The history is provided by the patient.  Shortness of Breath Associated symptoms: no abdominal pain, no chest pain, no fever, no headaches, no sore throat, no vomiting and no wheezing     Past Medical History:  Diagnosis Date  . Anemia   . Anxiety   . ASD (atrial septal defect) 06/11/2014   S/p patch repair  . Atherosclerosis of abdominal aorta (HCC) 06/11/2014   Age advanced atherosclerosis of the infrarenal aorta  . Depression   . Dysmenorrhea   . Dysuria   . Encounter for long-term (current) use of other medications   . Fibromyalgia   . Headache   . Hematuria   . High cholesterol   . Left knee pain   . Migraine   . Other malaise and fatigue   . Pure hyperglyceridemia   . Routine general medical examination at a health care facility   . Shoulder pain   . Vitamin D deficiency   . Vitamin D deficiency   . VSD (ventricular septal defect), multiple 06/11/2014   S/p patch repair    Patient Active Problem List   Diagnosis Date Noted  . Labral tear of hip  joint 10/22/2018  . Recurrent fever 12/26/2016  . Muscle pain 04/19/2015  . Neck pain 02/22/2015  . Abnormality of gait 02/22/2015  . Pelvic pain in female 10/02/2014  . Chest pain 06/11/2014  . ASD (atrial septal defect) 06/11/2014  . VSD (ventricular septal defect), multiple 06/11/2014  . Atherosclerosis of abdominal aorta (HCC) 06/11/2014  . Other malaise and fatigue   . Shoulder pain   . Dysmenorrhea   . Dysuria   . Hematuria   . Anxiety   . Encounter for long-term (current) use of other medications   . Vitamin D deficiency   . Pure hyperglyceridemia   . Migraine   . Depression     Past Surgical History:  Procedure Laterality Date  . c seaction    . CARDIAC SURGERY     as child  . CESAREAN SECTION    . KNEE ARTHROSCOPY W/ ACL RECONSTRUCTION AND HAMSTRING GRAFT Left   . vetricular hole       OB History   No obstetric history on file.      Home Medications    Prior to Admission medications   Medication Sig Start Date End Date Taking? Authorizing Provider  acetaminophen (TYLENOL) 650 MG CR tablet Take 650 mg by mouth every 8 (eight) hours as  needed for pain. Reported on 07/01/2015   Yes [provider]  Albuterol Sulfate (PROAIR RESPICLICK) 108 (90 Base) MCG/ACT AEPB Inhale 1-2 puffs into the lungs every 6 (six) hours. 07/20/17  Yes Wieters, Hallie C, PA-C  clonazePAM (KLONOPIN) 0.5 MG tablet Take 1 tablet (0.5 mg total) by mouth 2 (two) times daily as needed for anxiety. 07/30/17  Yes Saguier, Ramon DredgeEdward, PA-C  FLUoxetine (PROZAC) 40 MG capsule TAKE 1 CAPSULE BY MOUTH EVERY DAY 10/23/18  Yes Saguier, Ramon DredgeEdward, PA-C  fluticasone [facility](FLONASE) 50 MCG/ACT nasal spray SPRAY 2 SPRAYS INTO EACH NOSTRIL EVERY DAY 12/05/18  Yes Saguier, Ramon DredgeEdward, PA-C  gabapentin (NEURONTIN) 300 MG capsule TAKE 1 CAPSULE BY MOUTH FOUR TIMES A DAY 10/22/18  Yes Saguier, Ramon DredgeEdward, PA-C  methocarbamol (ROBAXIN) 500 MG tablet Take 500 mg by mouth every 8 (eight) hours as needed for muscle spasms.   Yes  [provider]  SUMAtriptan (IMITREX) 50 MG tablet Take 1 tablet (50 mg total) by mouth every 2 (two) hours as needed for migraine. May repeat in 2 hours if headache persists or recurs. 09/03/18  Yes Saguier, Ramon DredgeEdward, PA-C  traMADol (ULTRAM) 50 MG tablet TAKE 1 TABLET BY MOUTH EVERY 8 HOURS AS NEEDED FOR PAIN 11/12/18  Yes Saguier, Ramon DredgeEdward, PA-C  vitamin B-12 (CYANOCOBALAMIN) 1000 MCG tablet Take 1,000 mcg by mouth daily.   Yes [provider]  vitamin C (ASCORBIC ACID) 500 MG tablet Take 2,000 mg by mouth daily.   Yes [provider]    Family History Family History  Problem Relation Age of Onset  . Depression Mother   . High blood pressure Mother   . Hyperlipidemia Mother   . Anxiety disorder Mother   . Hypertension Mother   . High blood pressure Father   . Hyperlipidemia Father   . Hypertension Father   . Crohn's disease Father   . Kidney disease Father   . Peripheral vascular disease Father   . Peripheral vascular disease Paternal Grandfather     Social History Social History   Tobacco Use  . Smoking status: Never Smoker  . Smokeless tobacco: Never Used  Substance Use Topics  . Alcohol use: No    Alcohol/week: 0.0 standard drinks  . Drug use: No     Allergies   Banana, Nsaids, Other, and Sulfa antibiotics   Review of Systems Review of Systems  Constitutional: Negative for fever.  HENT: Negative for sore throat.   Eyes: Negative for redness.  Respiratory: Positive for shortness of breath. Negative for wheezing.   Cardiovascular: Negative for chest pain.  Gastrointestinal: Positive for diarrhea and nausea. Negative for abdominal pain and vomiting.  Genitourinary: Negative for flank pain.  Musculoskeletal: Positive for myalgias.  Skin: Negative for pallor and wound.  Neurological: Negative for syncope, weakness, light-headedness and headaches.     Physical Exam Updated Vital Signs BP (!) 116/56   Pulse (!) 59   Temp 98.9 F (37.2 C)  (Oral)   Resp 16   SpO2 97%   Physical Exam Vitals signs and nursing note reviewed.  Constitutional:      Appearance: She is well-developed.     Comments: Non ill appearing.   HENT:     Head: Normocephalic.     Mouth/Throat:     Mouth: Mucous membranes are moist.     Pharynx: Oropharynx is clear.  Neck:     Musculoskeletal: Normal range of motion and neck supple.  Cardiovascular:     Rate and Rhythm: Normal rate.  Comments: No Bl pitting edema.  Pulmonary:     Effort: Pulmonary effort is normal. No tachypnea.     Breath sounds: Normal breath sounds. No decreased breath sounds, wheezing or rales.     Comments: Lungs are clear to auscultation.  Chest:     Chest wall: No tenderness.     Comments: No tenderness with palpation of the chest wall.  Musculoskeletal:     Right lower leg: No edema.     Left lower leg: No edema.  Skin:    General: Skin is warm and dry.  Neurological:     Mental Status: She is alert and oriented to person, place, and time.      ED Treatments / Results  Labs (all labs ordered are listed, but only abnormal results are displayed) Labs Reviewed  NOVEL CORONAVIRUS, NAA (HOSP ORDER, SEND-OUT TO REF LAB; TAT 18-24 HRS)  CBC WITH DIFFERENTIAL/PLATELET  COMPREHENSIVE METABOLIC PANEL  LIPASE, BLOOD    EKG EKG Interpretation  Date/Time:  Wednesday December 10 2018 16:51:56 EDT Ventricular Rate:  62 PR Interval:    QRS Duration: 99 QT Interval:  426 QTC Calculation: 433 R Axis:   96 Text Interpretation:  Sinus rhythm Probable left atrial enlargement Borderline right axis deviation Nonspecific T abnormalities, lateral leads Confirmed by Virgel Manifold [id number]) on 12/10/2018 5:30:16 PM   Radiology Dg Chest 2 View  Result Date: 12/10/2018 CLINICAL DATA:  53 year old female with shortness of breath EXAM: CHEST - 2 VIEW COMPARISON:  Chest radiograph dated 10/04/2016 FINDINGS: There is no focal consolidation, pleural effusion, pneumothorax. The  cardiac silhouette is within normal limits. Scoliosis. No acute osseous pathology. IMPRESSION: No active cardiopulmonary disease. Electronically Signed   By: Anner Crete M.D.   On: 12/10/2018 17:22    Procedures Procedures (including critical care time)  Medications Ordered in ED Medications  albuterol (VENTOLIN HFA) 108 (90 Base) MCG/ACT inhaler 4 puff (4 puffs Inhalation Given 12/10/18 2125)     Initial Impression / Assessment and Plan / ED Course  I have reviewed the triage vital signs and the nursing notes.  Pertinent labs & imaging results that were available during my care of the patient were reviewed by me and considered in my medical decision making (see chart for details).       Patient with no pertinent past medical history presents to the ED with complaints of shortness of breath, this began about a week ago, she reports her husband has been sick with multiple episodes of diarrhea, nausea, some fevers.  She reports a T-max of 101, suspect diagnosis of COVID-19, she has not been tested for this.  Patient arrived to the ED satting at 98% on room air, no tachycardia, no tachypnea.  X-ray of her chest was obtained which showed no consolidation, pneumothorax, pleural effusion.  CBC was obtained which showed no leukocytosis, hemoglobin is within normal limits.  CMP without any electrolyte derangement, creatinine level is unremarkable.  LFTs are within normal limits, reports no abdominal pain.  Lipase level is within normal limits.  OB testing was obtained and will be send out.  EKG without any changes of ischemia or infarct.   Patient was given albuterol inhaler, she had been using an albuterol inhaler at home which was not helping with her symptoms, this inhaler was noted to be expired on her ED visit.  She denies any previous history of blood clots, she is PERC negative without any tachycardia.  She ambulated around the ED with a pulse ox  did not drop below 94%.  Low suspicion for  pulmonary embolism.  Patient understands she is to follow-up with PCP as needed, she is to return to the ED if she experience any worsening symptoms.  Return precautions discussed at length.  FRANCHELLE RECIO was evaluated in Emergency Department on 12/10/2018 for the symptoms described in the history of present illness. She was evaluated in the context of the global COVID-19 pandemic, which necessitated consideration that the patient might be at risk for infection with the SARS-CoV-2 virus that causes COVID-19. Institutional protocols and algorithms that pertain to the evaluation of patients at risk for COVID-19 are in a state of rapid change based on information released by regulatory bodies including the CDC and federal and state organizations. These policies and algorithms were followed during the patient's care in the ED.   Portions of this note were generated with Scientist, clinical (histocompatibility and immunogenetics). Dictation errors may occur despite best attempts at proofreading.   Final Clinical Impressions(s) / ED Diagnoses   Final diagnoses:  Shortness of breath    ED Discharge Orders    None       Claude Manges, Cordelia Poche 12/10/18 2151    Raeford Razor, MD 12/14/18 1244

## 2018-12-10 NOTE — Patient Instructions (Signed)
Patient has constellation of various signs/symptoms that could indicate COVID infection.  Most troubling symptom presently is her reported dyspnea.  Patient does not have O2 sat monitor at her house.  Based on her overall description I do think it would be best for her to be evaluated in the emergency department this afternoon.  Patient states that she will go to [facility] long ED for evaluation.  He should send me her emergency department work-up evaluation and I will follow later.  I explained to patient the benefit of getting the work-up with early diagnosis.  Patient expressed understanding and will go shortly after virtual visit.  Follow-up date to be determined after ED evaluation.

## 2018-12-11 LAB — NOVEL CORONAVIRUS, NAA (HOSP ORDER, SEND-OUT TO REF LAB; TAT 18-24 HRS): SARS-CoV-2, NAA: NOT DETECTED

## 2018-12-17 ENCOUNTER — Other Ambulatory Visit: Payer: Self-pay | Admitting: Medical

## 2018-12-18 MED ORDER — TRAMADOL HCL 50 MG PO TABS: ORAL_TABLET | ORAL | 0 refills | Status: DC

## 2018-12-18 NOTE — Telephone Encounter (Addendum)
Refill Request: Tramadol   Last RX: 11/12/18 Last OV: Next OV:12/10/18 UDS:03/06/18 CSC:03/06/18 CSR:12/18/2018  Refilled today.

## 2019-01-06 ENCOUNTER — Other Ambulatory Visit: Payer: Self-pay | Admitting: *Deleted

## 2019-01-06 MED ORDER — FLUTICASONE PROPIONATE 50 MCG/ACT NA SUSP: NASAL | 1 refills | Status: DC

## 2019-01-16 ENCOUNTER — Other Ambulatory Visit: Payer: Self-pay | Admitting: Medical

## 2019-01-17 ENCOUNTER — Other Ambulatory Visit: Payer: Self-pay | Admitting: Medical

## 2019-01-17 MED ORDER — TRAMADOL HCL 50 MG PO TABS: ORAL_TABLET | ORAL | 0 refills | Status: DC

## 2019-01-17 NOTE — Telephone Encounter (Signed)
Controlled med site reviewed today. Up to date on uds, and contract. Refilled pt tramadol today.

## 2019-01-26 ENCOUNTER — Other Ambulatory Visit: Payer: Self-pay

## 2019-01-26 ENCOUNTER — Ambulatory Visit (INDEPENDENT_AMBULATORY_CARE_PROVIDER_SITE_OTHER): Payer: 59 | Admitting: Medical

## 2019-01-26 DIAGNOSIS — M797 Fibromyalgia: Secondary | ICD-10-CM | POA: Diagnosis not present

## 2019-01-26 MED ORDER — PREGABALIN 75 MG PO CAPS: ORAL_CAPSULE | ORAL | 0 refills | Status: DC

## 2019-01-26 NOTE — Patient Instructions (Addendum)
For fibromyalgia pain, I sent in lyrica and advise stop gabapentin(pt expressed understanding on DC gabapentin). Stay on prozac but in near future if maxing out on lyrica but still in severe pain  may add low dose cymbalta. Will do with caution as you perceived side effects when you were hospitalized with conversion disorder in 2016. Though now you report did not have reaction and it helped remarkably  Follow up in 2 weeks or as needed
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
## 2019-01-30 ENCOUNTER — Encounter: Payer: Self-pay | Admitting: Medical

## 2019-01-31 ENCOUNTER — Telehealth: Payer: Self-pay | Admitting: Medical

## 2019-01-31 NOTE — Telephone Encounter (Signed)
Opened to review visits since 2017 with former pcp and my notes as well.

## 2019-02-01 ENCOUNTER — Encounter: Payer: Self-pay | Admitting: Medical

## 2019-02-04 ENCOUNTER — Other Ambulatory Visit: Payer: Self-pay | Admitting: Medical

## 2019-02-09 ENCOUNTER — Other Ambulatory Visit: Payer: Self-pay | Admitting: Medical

## 2019-02-09 MED ORDER — PREGABALIN 150 MG PO CAPS: 150.0000 mg | ORAL_CAPSULE | Freq: Two times a day (BID) | ORAL | 0 refills | Status: DC

## 2019-02-09 NOTE — Telephone Encounter (Signed)
Rx higher dose lyrica sent to pt pharmacy. Titrating up. This is dose that presently feel comfortable prescribing.

## 2019-02-17 ENCOUNTER — Other Ambulatory Visit: Payer: Self-pay

## 2019-02-18 ENCOUNTER — Ambulatory Visit (INDEPENDENT_AMBULATORY_CARE_PROVIDER_SITE_OTHER): Payer: 59 | Admitting: Medical

## 2019-02-18 DIAGNOSIS — M797 Fibromyalgia: Secondary | ICD-10-CM

## 2019-02-18 DIAGNOSIS — U071 COVID-19: Secondary | ICD-10-CM

## 2019-02-18 MED ORDER — AZITHROMYCIN 250 MG PO TABS: ORAL_TABLET | ORAL | 0 refills | Status: DC

## 2019-02-18 MED ORDER — TRAMADOL HCL 50 MG PO TABS: ORAL_TABLET | ORAL | 0 refills | Status: DC

## 2019-02-18 MED ORDER — BENZONATATE 100 MG PO CAPS: 100.0000 mg | ORAL_CAPSULE | Freq: Three times a day (TID) | ORAL | 0 refills | Status: DC | PRN

## 2019-02-18 MED ORDER — CYCLOBENZAPRINE HCL 5 MG PO TABS: ORAL_TABLET | ORAL | 1 refills | Status: AC

## 2019-02-18 NOTE — Patient Instructions (Addendum)
I am glad that you are better following clinical covid dx. Your presentation does sound suspicious and rapid test are not 100% accurate. Since you do report some productive cough I do think good idea for you to start azithromycin antibiotic and send in rx benzonatate for cough. Stay quarantined for additional 4 days.  For fibromyalgia, I am glad that you are doing well with lyrica and that you are able to decrease tramadol since starting. Will refill you tramadol today as you are due for refill. When in next time will need to add lyrica to controlled med contract. Some benefit from low dose flexeril in past described when fibro flares. Will make limitid low dose flexeril available. Just use at night if needed.  Follow up January- feb or as needed

## 2019-02-18 NOTE — Progress Notes (Addendum)
Subjective:    Patient ID: Joanna Anderson, female    DOB: 1965/09/28, 53 y.o.   MRN: [id number]  HPI  Virtual Visit via Video Note  I connected with Joanna Anderson on 02/18/19 at  8:00 AM EST by a video enabled telemedicine application and verified that I am speaking with the correct person using two identifiers.  Location: Patient: home Provider: office   I discussed the limitations of evaluation and management by telemedicine and the availability of in person appointments. The patient expressed understanding and agreed to proceed.  History of Present Illness:   Pt states she last Monday  had some nasal congestion, runny nose and nasal congestion. 3 days 102 fever. Pt had clinical diagnosis of covid per [facility]. Pt states lost complete sense of smell and taste for 5 days. Today is first day that she can smell.  Mid way through had st. Pt states mid way thru was 3 days stayed in bad.Pt O2 sat was 98% at [facility].  Presently feels a lot better. Some mild productive cough, no wheezing, no fever and more energetic. Pt works at home.   Pt also follow up for fibromyalgia. She has been doing much better since starting lyrica. Now on 150 mg twice daily. She states it seems to helping a lot. She states using tramadol just twice a day.  She states stiffness and joint pains seems to help a lot.   Muscle relaxant seem to help. When she had flexeril seemed to help better than robaxin. She states will only use during night.    Pt states will begin to walk after buying treadmill.    Past Medical History:  Diagnosis Date  . Anemia   . Anxiety   . ASD (atrial septal defect) 06/11/2014   S/p patch repair  . Atherosclerosis of abdominal aorta (HCC) 06/11/2014   Age advanced atherosclerosis of the infrarenal aorta  . Depression   . Dysmenorrhea   . Dysuria   . Encounter for long-term (current) use of other medications   . Fibromyalgia   . Headache   . Hematuria   . High cholesterol   .  Left knee pain   . Migraine   . Other malaise and fatigue   . Pure hyperglyceridemia   . Routine general medical examination at a health care facility   . Shoulder pain   . Vitamin D deficiency   . Vitamin D deficiency   . VSD (ventricular septal defect), multiple 06/11/2014   S/p patch repair     Social History   Socioeconomic History  . Marital status: Married    Spouse name: Not on file  . Number of children: 4  . Years of education: College  . Highest education level: Not on file  Occupational History  . Occupation: Charity fundraiserN    Comment: [employer]  Social Needs  . Financial resource strain: Not on file  . Food insecurity    Worry: Not on file    Inability: Not on file  . Transportation needs    Medical: Not on file    Non-medical: Not on file  Tobacco Use  . Smoking status: Never Smoker  . Smokeless tobacco: Never Used  Substance and Sexual Activity  . Alcohol use: No    Alcohol/week: 0.0 standard drinks  . Drug use: No  . Sexual activity: Not on file  Lifestyle  . Physical activity    Days per week: Not on file    Minutes per  session: Not on file  . Stress: Not on file  Relationships  . Social Musician on phone: Not on file    Gets together: Not on file    Attends religious service: Not on file    Active member of club or organization: Not on file    Attends meetings of clubs or organizations: Not on file    Relationship status: Not on file  . Intimate partner violence    Fear of current or ex partner: Not on file    Emotionally abused: Not on file    Physically abused: Not on file    Forced sexual activity: Not on file  Other Topics Concern  . Not on file  Social History Narrative   Lives at home with husband and children.   Right-handed.    Past Surgical History:  Procedure Laterality Date  . c seaction    . CARDIAC SURGERY     as child  . CESAREAN SECTION    . KNEE ARTHROSCOPY W/ ACL RECONSTRUCTION AND HAMSTRING GRAFT Left   . vetricular  hole      Family History  Problem Relation Age of Onset  . Depression Mother   . High blood pressure Mother   . Hyperlipidemia Mother   . Anxiety disorder Mother   . Hypertension Mother   . High blood pressure Father   . Hyperlipidemia Father   . Hypertension Father   . Crohn's disease Father   . Kidney disease Father   . Peripheral vascular disease Father   . Peripheral vascular disease Paternal Grandfather     Allergies  Allergen Reactions  . Banana Other (See Comments)    Headaches  . Nsaids Nausea Only and Other (See Comments)    Also caused stomach ulcers (can only tolerate in limited doses)  . Other Other (See Comments)    SSRI(s): Makes the patient feel "disconnected" Tricyclic antidepressants- Muscle spasms Sulfates- Itching  . Sulfa Antibiotics Hives    "gums peeled off"    Current Outpatient Medications on File Prior to Visit  Medication Sig Dispense Refill  . acetaminophen (TYLENOL) 650 MG CR tablet Take 650 mg by mouth every 8 (eight) hours as needed for pain. Reported on 07/01/2015    . Albuterol Sulfate (PROAIR RESPICLICK) 108 (90 Base) MCG/ACT AEPB Inhale 1-2 puffs into the lungs every 6 (six) hours. 1 each 0  . clonazePAM (KLONOPIN) 0.5 MG tablet Take 1 tablet (0.5 mg total) by mouth 2 (two) times daily as needed for anxiety. 60 tablet 1  . fluticasone (FLONASE) 50 MCG/ACT nasal spray SPRAY 2 SPRAYS INTO EACH NOSTRIL EVERY DAY 16 mL 1  . methocarbamol (ROBAXIN) 500 MG tablet Take 500 mg by mouth every 8 (eight) hours as needed for muscle spasms.    . pregabalin (LYRICA) 150 MG capsule Take 1 capsule (150 mg total) by mouth 2 (two) times daily. 60 capsule 0  . SUMAtriptan (IMITREX) 50 MG tablet Take 1 tablet (50 mg total) by mouth every 2 (two) hours as needed for migraine. May repeat in 2 hours if headache persists or recurs. 10 tablet 2  . traMADol (ULTRAM) 50 MG tablet TAKE 1 TABLET BY MOUTH EVERY 8 HOURS AS NEEDED FOR PAIN 90 tablet 0  . vitamin B-12  (CYANOCOBALAMIN) 1000 MCG tablet Take 1,000 mcg by mouth daily.    . vitamin C (ASCORBIC ACID) 500 MG tablet Take 2,000 mg by mouth daily.     No current facility-administered medications  on file prior to visit.     There were no vitals taken for this visit.    Observations/Objective:  General-no acute distress, pleasant, oriented. Lungs- on inspection lungs appear unlabored. Neck- no tracheal deviation or jvd on inspection. Neuro- gross motor function appears intact.     Assessment and Plan:   Follow Up Instructions:    I discussed the assessment and treatment plan with the patient. The patient was provided an opportunity to ask questions and all were answered. The patient agreed with the plan and demonstrated an understanding of the instructions.   The patient was advised to call back or seek an in-person evaluation if the symptoms worsen or if the condition fails to improve as anticipated.  I provided 25 + minutes of non-face-to-face time during this encounter.   Esperanza Richters, PA-C   Review of Systems  Constitutional: Negative for chills, fatigue and fever.  HENT: Negative for congestion, rhinorrhea, sinus pressure, sinus pain and sore throat.   Respiratory: Positive for cough. Negative for chest tightness, shortness of breath and wheezing.   Cardiovascular: Negative for chest pain and palpitations.  Gastrointestinal: Negative for abdominal pain, constipation and vomiting.  Musculoskeletal: Negative for back pain and joint swelling.       Fibromyalgia pain is less since started lyrica.  Skin: Negative for rash.  Neurological: Negative for dizziness, syncope, weakness and headaches.  Hematological: Negative for adenopathy.  Psychiatric/Behavioral: Negative for behavioral problems, confusion, dysphoric mood and sleep disturbance.       Mood is good today.    Past Medical History:  Diagnosis Date  . Anemia   . Anxiety   . ASD (atrial septal defect) 06/11/2014    S/p patch repair  . Atherosclerosis of abdominal aorta (HCC) 06/11/2014   Age advanced atherosclerosis of the infrarenal aorta  . Depression   . Dysmenorrhea   . Dysuria   . Encounter for long-term (current) use of other medications   . Fibromyalgia   . Headache   . Hematuria   . High cholesterol   . Left knee pain   . Migraine   . Other malaise and fatigue   . Pure hyperglyceridemia   . Routine general medical examination at a health care facility   . Shoulder pain   . Vitamin D deficiency   . Vitamin D deficiency   . VSD (ventricular septal defect), multiple 06/11/2014   S/p patch repair     Social History   Socioeconomic History  . Marital status: Married    Spouse name: Not on file  . Number of children: 4  . Years of education: College  . Highest education level: Not on file  Occupational History  . Occupation: Charity fundraiser    Comment: [employer]  Social Needs  . Financial resource strain: Not on file  . Food insecurity    Worry: Not on file    Inability: Not on file  . Transportation needs    Medical: Not on file    Non-medical: Not on file  Tobacco Use  . Smoking status: Never Smoker  . Smokeless tobacco: Never Used  Substance and Sexual Activity  . Alcohol use: No    Alcohol/week: 0.0 standard drinks  . Drug use: No  . Sexual activity: Not on file  Lifestyle  . Physical activity    Days per week: Not on file    Minutes per session: Not on file  . Stress: Not on file  Relationships  . Social connections  Talks on phone: Not on file    Gets together: Not on file    Attends religious service: Not on file    Active member of club or organization: Not on file    Attends meetings of clubs or organizations: Not on file    Relationship status: Not on file  . Intimate partner violence    Fear of current or ex partner: Not on file    Emotionally abused: Not on file    Physically abused: Not on file    Forced sexual activity: Not on file  Other Topics Concern  .  Not on file  Social History Narrative   Lives at home with husband and children.   Right-handed.    Past Surgical History:  Procedure Laterality Date  . c seaction    . CARDIAC SURGERY     as child  . CESAREAN SECTION    . KNEE ARTHROSCOPY W/ ACL RECONSTRUCTION AND HAMSTRING GRAFT Left   . vetricular hole      Family History  Problem Relation Age of Onset  . Depression Mother   . High blood pressure Mother   . Hyperlipidemia Mother   . Anxiety disorder Mother   . Hypertension Mother   . High blood pressure Father   . Hyperlipidemia Father   . Hypertension Father   . Crohn's disease Father   . Kidney disease Father   . Peripheral vascular disease Father   . Peripheral vascular disease Paternal Grandfather     Allergies  Allergen Reactions  . Banana Other (See Comments)    Headaches  . Nsaids Nausea Only and Other (See Comments)    Also caused stomach ulcers (can only tolerate in limited doses)  . Other Other (See Comments)    SSRI(s): Makes the patient feel "disconnected" Tricyclic antidepressants- Muscle spasms Sulfates- Itching  . Sulfa Antibiotics Hives    "gums peeled off"    Current Outpatient Medications on File Prior to Visit  Medication Sig Dispense Refill  . acetaminophen (TYLENOL) 650 MG CR tablet Take 650 mg by mouth every 8 (eight) hours as needed for pain. Reported on 07/01/2015    . Albuterol Sulfate (PROAIR RESPICLICK) 161 (90 Base) MCG/ACT AEPB Inhale 1-2 puffs into the lungs every 6 (six) hours. 1 each 0  . clonazePAM (KLONOPIN) 0.5 MG tablet Take 1 tablet (0.5 mg total) by mouth 2 (two) times daily as needed for anxiety. 60 tablet 1  . fluticasone (FLONASE) 50 MCG/ACT nasal spray SPRAY 2 SPRAYS INTO EACH NOSTRIL EVERY DAY 16 mL 1  . pregabalin (LYRICA) 150 MG capsule Take 1 capsule (150 mg total) by mouth 2 (two) times daily. 60 capsule 0  . SUMAtriptan (IMITREX) 50 MG tablet Take 1 tablet (50 mg total) by mouth every 2 (two) hours as needed for  migraine. May repeat in 2 hours if headache persists or recurs. 10 tablet 2  . traMADol (ULTRAM) 50 MG tablet TAKE 1 TABLET BY MOUTH EVERY 8 HOURS AS NEEDED FOR PAIN 90 tablet 0  . vitamin B-12 (CYANOCOBALAMIN) 1000 MCG tablet Take 1,000 mcg by mouth daily.    . vitamin C (ASCORBIC ACID) 500 MG tablet Take 2,000 mg by mouth daily.     No current facility-administered medications on file prior to visit.     There were no vitals taken for this visit.      Objective:   Physical Exam        Assessment & Plan:  I am glad that  you are better following clinical covid dx. Your presentation does sound suspicious and rapid test are not 100% accurate. Since you do report some productive cough I do think good idea for you to start azithromycin antibiotic and send in rx benzonatate for cough. Stay quarantined for additional 4 days.  For fibromyalgia, I am glad that you are doing well with lyrica and that you are able to decrease tramadol since starting. Will refill you tramadol today as you are due for refill. When in next time will need to add lyrica to controlled med contract. Some benefit from low dose flexeril in past described when fibro flares. Will make limitid low dose flexeril available. Just use at night if needed.  Follow up January- feb or as needed  Sending not to supervising DO to review.  Esperanza Richters, PA-C

## 2019-02-19 ENCOUNTER — Encounter: Payer: Self-pay | Admitting: Medical

## 2019-02-19 ENCOUNTER — Telehealth: Payer: Self-pay | Admitting: Medical

## 2019-02-19 MED ORDER — AZITHROMYCIN 250 MG PO TABS: ORAL_TABLET | ORAL | 0 refills | Status: DC

## 2019-02-19 NOTE — Telephone Encounter (Signed)
Rx zpack resent. See my chart message.

## 2019-03-03 ENCOUNTER — Other Ambulatory Visit: Payer: Self-pay | Admitting: Medical

## 2019-03-12 ENCOUNTER — Other Ambulatory Visit: Payer: Self-pay | Admitting: Medical

## 2019-03-12 NOTE — Telephone Encounter (Signed)
Will you refill in [place] absence  Last written: 02/09/19 Last ov: 02/18/19 Next ov: nothing scheduled Contract: due UDS: due

## 2019-03-12 NOTE — Telephone Encounter (Signed)
Lyrica sent in by Dr. Etter Sjogren. So did not duplicate rx.

## 2019-03-13 ENCOUNTER — Encounter: Payer: Self-pay | Admitting: Medical

## 2019-03-31 ENCOUNTER — Other Ambulatory Visit: Payer: Self-pay | Admitting: Medical

## 2019-03-31 MED ORDER — TRAMADOL HCL 50 MG PO TABS: ORAL_TABLET | ORAL | 0 refills | Status: DC

## 2019-03-31 NOTE — Telephone Encounter (Signed)
Refilled tramadol. Will you get her scheduled to give uds within a month.She is over due for uds.

## 2019-03-31 NOTE — Telephone Encounter (Signed)
Requesting:tramadol Contract:11/03/2018 UDS:03/06/2018 Last Visit:02/18/2019 Next Visit:none scheduled Last Refill:02/18/2019  Please Advise

## 2019-04-04 ENCOUNTER — Other Ambulatory Visit: Payer: Self-pay | Admitting: Medical

## 2019-04-09 ENCOUNTER — Other Ambulatory Visit: Payer: Self-pay | Admitting: Medical

## 2019-04-09 MED ORDER — PREGABALIN 150 MG PO CAPS: 150.0000 mg | ORAL_CAPSULE | Freq: Two times a day (BID) | ORAL | 0 refills | Status: DC

## 2019-04-09 NOTE — Telephone Encounter (Addendum)
Requesting: Lyrica Contract: 03/06/2018 UDS: 03/06/2018 Last OV: 02/18/2019 Next OV: N/A Last Refill: 03/12/2019, #60--0 RF Database: Reviewed today.   Please advise   Sent in one month rx.   Notify pt she needs to get scheduled for uds and update contract before her next refill.   Go ahead and get her scheduled. I just saw her 2 months ago so if everything going well she just needs to give uds and sign contract. Does not need appointment with me unless she wants one.

## 2019-05-04 ENCOUNTER — Other Ambulatory Visit: Payer: Self-pay

## 2019-05-04 ENCOUNTER — Other Ambulatory Visit: Payer: Self-pay | Admitting: Medical

## 2019-05-05 ENCOUNTER — Ambulatory Visit (INDEPENDENT_AMBULATORY_CARE_PROVIDER_SITE_OTHER): Payer: No Typology Code available for payment source | Admitting: Medical

## 2019-05-05 ENCOUNTER — Other Ambulatory Visit: Payer: Self-pay

## 2019-05-05 ENCOUNTER — Ambulatory Visit (HOSPITAL_BASED_OUTPATIENT_CLINIC_OR_DEPARTMENT_OTHER)
Admission: RE | Admit: 2019-05-05 | Discharge: 2019-05-05 | Disposition: A | Discharge status: Home / Self Care | Payer: No Typology Code available for payment source | Source: Ambulatory Visit | Attending: Medical | Admitting: Medical

## 2019-05-05 ENCOUNTER — Encounter: Payer: Self-pay | Admitting: Medical

## 2019-05-05 VITALS — BP 111/60 | HR 71 | Temp 95.7°F | Resp 16 | Ht 66.0 in | Wt 169.4 lb

## 2019-05-05 DIAGNOSIS — M533 Sacrococcygeal disorders, not elsewhere classified: Secondary | ICD-10-CM

## 2019-05-05 DIAGNOSIS — M5442 Lumbago with sciatica, left side: Secondary | ICD-10-CM | POA: Insufficient documentation

## 2019-05-05 DIAGNOSIS — R438 Other disturbances of smell and taste: Secondary | ICD-10-CM | POA: Diagnosis not present

## 2019-05-05 DIAGNOSIS — Z20822 Contact with and (suspected) exposure to covid-19: Secondary | ICD-10-CM

## 2019-05-05 DIAGNOSIS — M25552 Pain in left hip: Secondary | ICD-10-CM | POA: Diagnosis present

## 2019-05-05 MED ORDER — PREDNISONE 10 MG PO TABS: ORAL_TABLET | ORAL | 0 refills | Status: AC

## 2019-05-05 MED ORDER — TRAMADOL HCL 50 MG PO TABS: ORAL_TABLET | ORAL | 0 refills | Status: DC

## 2019-05-05 NOTE — Patient Instructions (Addendum)
For pain in coccyx and lumbar spine after fall will rx 4 day taper prednisone and use tylenol as well.  For areas of pain will get xray of lumbar spine and coccyx.  For rt trapezius pain continue current meds for fibromyalgia and add the above. No mid c-spine pain so not getting c-spine xray.  Follow up date to be determined after xray.

## 2019-05-05 NOTE — Addendum Note (Signed)
Addended by: Gwenevere Abbot on: 05/05/2019 10:09 AM   Modules accepted: Orders

## 2019-05-05 NOTE — Progress Notes (Signed)
Subjective:    Patient ID: Joanna Anderson, female    DOB: 1965-09-20, 54 y.o.   MRN: [id number]  HPI  Pt fell down stairs at her house. Tripped and landed on tailbone. Some lower back pain and rt side trapezius pain. Primarily landed on her tailbone per pt.   Pt had high level pain coccyx yesterday but today less pain. Sitting pain at times can jump up to high level 9 depending on position. About level 6/10 on average. She can hear tailbone or something click.  Mild lower back and trapezius pain.  Pt has fibromyalgia and is on lyrica, tramadol and muscle relaxant.   Review of Systems  Constitutional: Negative for chills, fatigue and fever.  Respiratory: Negative for chest tightness, shortness of breath and wheezing.   Cardiovascular: Negative for chest pain and palpitations.  Gastrointestinal: Negative for abdominal pain.  Musculoskeletal: Positive for back pain.  Skin: Negative for rash.  Neurological: Negative for dizziness, weakness, numbness and headaches.  Hematological: Negative for adenopathy. Does not bruise/bleed easily.  Psychiatric/Behavioral: Negative for agitation and confusion. The patient is not nervous/anxious.     Past Medical History:  Diagnosis Date  . Anemia   . Anxiety   . ASD (atrial septal defect) 06/11/2014   S/p patch repair  . Atherosclerosis of abdominal aorta ([facility]) 06/11/2014   Age advanced atherosclerosis of the infrarenal aorta  . Depression   . Dysmenorrhea   . Dysuria   . Encounter for long-term (current) use of other medications   . Fibromyalgia   . Headache   . Hematuria   . High cholesterol   . Left knee pain   . Migraine   . Other malaise and fatigue   . Pure hyperglyceridemia   . Routine general medical examination at a health care facility   . Shoulder pain   . Vitamin D deficiency   . Vitamin D deficiency   . VSD (ventricular septal defect), multiple 06/11/2014   S/p patch repair     Social History   Socioeconomic History   . Marital status: Married    Spouse name: Not on file  . Number of children: 4  . Years of education: College  . Highest education level: Not on file  Occupational History  . 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 Smoking status: Never Smoker  . Smokeless tobacco: Never Used  Substance and Sexual Activity  . Alcohol use: No    Alcohol/week: 0.0 standard drinks  . Drug use: No  . Sexual activity: Not on file  Other Topics Concern  . Not on file  Social History Narrative   Lives at home with husband and children.   Right-handed.   Social Determinants of Health   Financial Resource Strain:   . Difficulty of Paying Living Expenses: Not on file  Food Insecurity:   . Worried About Programme researcher, broadcasting/film/video in the Last Year: Not on file  . Ran Out of Food in the Last Year: Not on file  Transportation Needs:   . Lack of Transportation (Medical): Not on file  . Lack of Transportation (Non-Medical): Not on file  Physical Activity:   . Days of Exercise per Week: Not on file  . Minutes of Exercise per Session: Not on file  Stress:   . Feeling of Stress : Not on file  Social Connections:   . Frequency of Communication with Friends and Family: Not on file  . Frequency of Social  Gatherings with Friends and Family: Not on file  . Attends Religious Services: Not on file  . Active Member of Clubs or Organizations: Not on file  . Attends Banker Meetings: Not on file  . Marital Status: Not on file  Intimate Partner Violence:   . Fear of Current or Ex-Partner: Not on file  . Emotionally Abused: Not on file  . Physically Abused: Not on file  . Sexually Abused: Not on file    Past Surgical History:  Procedure Laterality Date  . c seaction    . CARDIAC SURGERY     as child  . CESAREAN SECTION    . KNEE ARTHROSCOPY W/ ACL RECONSTRUCTION AND HAMSTRING GRAFT Left   . vetricular hole      Family History  Problem Relation Age of Onset  . Depression Mother   .  High blood pressure Mother   . Hyperlipidemia Mother   . Anxiety disorder Mother   . Hypertension Mother   . High blood pressure Father   . Hyperlipidemia Father   . Hypertension Father   . Crohn's disease Father   . Kidney disease Father   . Peripheral vascular disease Father   . Peripheral vascular disease Paternal Grandfather     Allergies  Allergen Reactions  . Banana Other (See Comments)    Headaches  . Nsaids Nausea Only and Other (See Comments)    Also caused stomach ulcers (can only tolerate in limited doses)  . Other Other (See Comments)    SSRI(s): Makes the patient feel "disconnected" Tricyclic antidepressants- Muscle spasms Sulfates- Itching  . Sulfa Antibiotics Hives    "gums peeled off"    Current Outpatient Medications on File Prior to Visit  Medication Sig Dispense Refill  . acetaminophen (TYLENOL) 650 MG CR tablet Take 650 mg by mouth every 8 (eight) hours as needed for pain. Reported on 07/01/2015    . Albuterol Sulfate (PROAIR RESPICLICK) 108 (90 Base) MCG/ACT AEPB Inhale 1-2 puffs into the lungs every 6 (six) hours. 1 each 0  . clonazePAM (KLONOPIN) 0.5 MG tablet Take 1 tablet (0.5 mg total) by mouth 2 (two) times daily as needed for anxiety. 60 tablet 1  . cyclobenzaprine (FLEXERIL) 5 MG tablet 1-2 tab po q hs as needed muscle spasms 20 tablet 1  . pregabalin (LYRICA) 150 MG capsule Take 1 capsule (150 mg total) by mouth 2 (two) times daily. 60 capsule 0  . SUMAtriptan (IMITREX) 50 MG tablet Take 1 tablet (50 mg total) by mouth every 2 (two) hours as needed for migraine. May repeat in 2 hours if headache persists or recurs. 10 tablet 2  . traMADol (ULTRAM) 50 MG tablet TAKE 1 TABLET BY MOUTH EVERY 8 HOURS AS NEEDED FOR PAIN 90 tablet 0  . vitamin B-12 (CYANOCOBALAMIN) 1000 MCG tablet Take 1,000 mcg by mouth daily.    . vitamin C (ASCORBIC ACID) 500 MG tablet Take 2,000 mg by mouth daily.    . fluticasone (FLONASE) 50 MCG/ACT nasal spray SPRAY 2 SPRAYS INTO  EACH NOSTRIL EVERY DAY 16 mL 5   No current facility-administered medications on file prior to visit.    BP 111/60 (BP Location: Left Arm, Patient Position: Sitting, Cuff Size: Normal)   Pulse 71   Temp (!) 95.7 F (35.4 C) (Temporal)   Resp 16   Ht 5\' 6"  (1.676 m)   Wt 169 lb 6.4 oz (76.8 kg)   SpO2 96%   BMI 27.34 kg/m  Objective:   Physical Exam  General- No acute distress. Pleasant patient. Neck- Full range of motion, no jvd. No mid spine tenderness. But rt trapezius tenderness. Lungs- Clear, even and unlabored. Heart- regular rate and rhythm. Neurologic- CNII- XII grossly intact.  Lumbar- mid lumbar spine tender. Some left paraspinal tenderness.  Coccyx- direct tenderness to palpation.       Assessment & Plan:   For pain in coccyx and lumbar spine after fall will rx 4 day taper prednisone and use tylenol as well.  For areas of pain will get xray of lumbar spine and coccyx.  For rt trapezius pain continue current meds for fibromyalgia and add the above. No mid cspine pain so not getting c-spine xray.  Follow up date to be determined after xray.  20 + minutes spent with pt.   Mackie Pai, PA-C

## 2019-05-06 LAB — SAR COV2 SEROLOGY (COVID19)AB(IGG),IA: SARS CoV2 AB IGG: NEGATIVE

## 2019-05-12 ENCOUNTER — Other Ambulatory Visit: Payer: Self-pay | Admitting: Medical

## 2019-05-12 MED ORDER — PREGABALIN 150 MG PO CAPS: 150.0000 mg | ORAL_CAPSULE | Freq: Two times a day (BID) | ORAL | 0 refills | Status: DC

## 2019-06-14 ENCOUNTER — Other Ambulatory Visit: Payer: Self-pay | Admitting: Medical

## 2019-06-15 NOTE — Telephone Encounter (Signed)
Requesting: tramadol & Lyrica Contract:11/03/18 UDS: 03/06/18 Last Visit:05/05/19 Next Visit:n/a Last Refill:lyrica(05/12/19) & tramadol(05/05/19)  Please Advise

## 2019-06-16 ENCOUNTER — Other Ambulatory Visit: Payer: Self-pay | Admitting: Medical

## 2019-06-16 MED ORDER — PREGABALIN 150 MG PO CAPS: 150.0000 mg | ORAL_CAPSULE | Freq: Two times a day (BID) | ORAL | 2 refills | Status: DC

## 2019-06-16 MED ORDER — TRAMADOL HCL 50 MG PO TABS: ORAL_TABLET | ORAL | 0 refills | Status: DC

## 2019-06-16 NOTE — Telephone Encounter (Signed)
Requesting:lyrica & tramadol Contract: UDS:03/06/18 Last Visit:05/05/19 Next Visit:n/a Last Refill: tramadol-05/05/19 & lyrica:05-12-19  Please Advise

## 2019-06-16 NOTE — Telephone Encounter (Signed)
Rx tramadol sent to pt pharmacy. Also sent in lyrica rx. Please get her scheduled for update controlled med contract and uds   Very important not sure why she is not updated??

## 2019-06-24 ENCOUNTER — Ambulatory Visit (INDEPENDENT_AMBULATORY_CARE_PROVIDER_SITE_OTHER): Payer: 59 | Admitting: Medical

## 2019-06-24 ENCOUNTER — Other Ambulatory Visit: Payer: Self-pay

## 2019-06-24 ENCOUNTER — Encounter: Payer: Self-pay | Admitting: Medical

## 2019-06-24 VITALS — HR 100 | Temp 99.1°F

## 2019-06-24 DIAGNOSIS — R5383 Other fatigue: Secondary | ICD-10-CM | POA: Diagnosis not present

## 2019-06-24 DIAGNOSIS — R1032 Left lower quadrant pain: Secondary | ICD-10-CM | POA: Diagnosis not present

## 2019-06-24 DIAGNOSIS — A689 Relapsing fever, unspecified: Secondary | ICD-10-CM | POA: Diagnosis not present

## 2019-06-24 DIAGNOSIS — K529 Noninfective gastroenteritis and colitis, unspecified: Secondary | ICD-10-CM

## 2019-06-24 NOTE — Patient Instructions (Signed)
One day of low grade fever and faint body aches. But on review no indicator of obvious infection. Some mild preceding fatigue for one week and hx of faint intermittent left lower quadrant pain that was worse during last menses.   Work up restricted today in light of symptoms in that can't see you in office do to pandemic and early onset fever.   I do want you to get tested for Covid Monday morning either through test center at [facility] or [facility].  Explained if test is negative then would likely be able to get her in the office sometime next week to work-up current condition.  During the interim if her signs and symptoms change or worsen let us know.  Particularly if she develops any degree of constant left lower quadrant pain.  In that event would go ahead and give Cipro and Flagyl to treat possible diverticulitis.  Patient expressed understanding.  She does have long history in the past of.  When she had low-grade fever of undetermined origin.  Extensive work-up was done and then she was sent to hematology/oncology.  MRI of head was recommended but patient did not follow through with that order.  Also looks like Crohn's or ulcerative colitis was possibility but looks like did not make it back to her gastroenterologist.  So we will go ahead and put in referral back to gastroenterologist.  We will see if this low-grade fever persist or signs symptoms change.  Patient is going to update me by my chart.  If any severe/worsening signs symptoms then recommend evaluation through urgent care or ED.  Follow-up date to be determined after she updates me on her Covid test result.  Note since her symptoms discharge today I did not think Covid test this early would be a good idea as could lead to false negative being first day of symptoms/fever.

## 2019-06-24 NOTE — Progress Notes (Addendum)
Subjective:    Patient ID: Joanna Anderson, female    DOB: 1965/05/16, 54 y.o.   MRN: [id number]  .Virtual Visit via Video Note  I connected with [employer] on 06/24/19 at  1:00 PM EDT by a video enabled telemedicine application and verified that I am speaking with the correct person using two identifiers.  Location: Patient: home  Provider: office   I discussed the limitations of evaluation and management by telemedicine and the availability of in person appointments. The patient expressed understanding and agreed to proceed.        History of Present Illness:  Pt has recent fever. Woke up this morning and temp 99.9 and 100.0  She has some mild aches and pain. No nasal congestion. Does feel tired. Pt states some menstrual type pain one week ago.  States last week had worse menses of her life. Pt is working from home.  Pt had extensive work up in past when she had fever. I ordered a lot of labs. As I recall she did not follow through with recommendation of mri.  Pt had no st, no nasal congestion, no diarrhea, no loss of smell, no loss of smell, and no night sweats.  No urinary tract type signs or symptoms. But does note any obvious uti type symptoms.  Pt never had diagnosed covid. Though she had clinical suspicion and close contact. Pt covid antibodies test were negative.   Feeling tired for about one week.    Pt last appointment with hematolgoist/oncologist for work up fuo. Was below.  History of Presenting Illness "Joanna LANINGHAM is a 54 y.o. [facility]. At the present time, patient reports recurrent fevers as high as 100.5 in July with generalized weakness, fatigue, and nausea. Nausea has improved from the summer, but still intermittently occurs. Patient reports intermittent sweats, including drenching night sweats. These have resolved, and patient only has occasional significant sweating with time. It appears  that patient has been doing well until about 3 years ago which time, she has developed a 9 month period of anemia continuous headaches which progressed to multitude of new symptoms and significantly altered the patient's activity levels. Prior to that, patient has been active outdoor runner, but has not been engaged in that since. She denies any previous tick bites. About 2 years ago, patient describes an episode of blistering rash after sun exposure. She also describes a skin rash in the bandlike pattern in the left lower quadrant of the abdomen wrapping around her body which she believes this might be an shingles. This has occurred over the summer, has resolved completely, with the exception of persistent itching. Patient reports decreased appetite, but no significant weight loss. She complains of constipation, no diarrhea, and persistent abdominal pain for the past 2 years. She has had a single emesis episode 2 night ago, but none previously or since. She does have heartburn. She reports history of Escherichia coli infection 3 years ago with clay-colored stools. At that time, patient reports undergoing colonoscopy and EGD which demonstrated no evidence of H. pylori, but gastric ulcer with bleeding. According to the [facility] report, biopsies were negative for celiac sprue or Crohn's ulcerative colitis. Patient did have episodes of dysuria and burning with urination associated with frequency of April 2018 was diagnosed with cystitis treated with antibiotics. She describes an episode of swollen painful nodule developing in the lower neck anteriorly in June. No skin changes overlying the  nodule, nodule has resolved at this time although some tenderness in the area still persists. Patient denies any focal extremity or facial weakness. No focal paresthesias or sensory loss.  Following the last visit to the clinic, additional assessment was requested including lab work and brain.  Nevertheless, patient did not  feel that I was addressing her symptoms adequately and canceled her scheduled follow-ups and did not obtain the labs or the MRI requested.  Presents to my clinic today.  When questioned why she has returned to my clinic, she does not have a clear idea.  In the interim, she has been undergoing evaluation by other providers.  Currently, a possibility of Crohn's colitis or ulcerative colitis has been raised based on recent lab work.  Further workup is planned by gastroenterology.  Patient denies any new or change symptoms since last visit to the clinic.  Continues to have poor energy.  Continues to have recurrent subjective fevers."    Observations/Objective:  General-no acute distress, pleasant, oriented. Lungs- on inspection lungs appear unlabored. Neck- no tracheal deviation or jvd on inspection. Neuro- gross motor function appears intact.  Assessment and Plan: One day of low grade fever and faint body aches. But on review no indicator of obvious infection. Some mild preceding fatigue for one week and hx of faint intermittent left lower quadrant pain that was worse during last menses.   Work up restricted today in light of symptoms in that can't see you in office do to pandemic and early onset fever.   I do want you to get tested for Covid Monday morning either through test center at [facility] or [facility].  Explained if test is negative then would likely be able to get her in the office sometime next week to work-up current condition.  During the interim if her signs and symptoms change or worsen let us know.  Particularly if she develops any degree of constant left lower quadrant pain.  In that event would go ahead and give Cipro and Flagyl to treat possible diverticulitis.  Patient expressed understanding.  She does have long history in the past of.  When she had low-grade fever of undetermined origin.  Extensive work-up was done and then she was sent to hematology/oncology.  MRI of head was  recommended but patient did not follow through with that order.  Also looks like Crohn's or ulcerative colitis was possibility but looks like did not make it back to her gastroenterologist.  So we will go ahead and put in referral back to gastroenterologist.  We will see if this low-grade fever persist or signs symptoms change.  Patient is going to update me by my chart.  If any severe/worsening signs symptoms then recommend evaluation through urgent care or ED.  Follow-up date to be determined after she updates me on her Covid test result.  Note since her symptoms discharge today I did not think Covid test this early would be a good idea as could lead to false negative being first day of symptoms/fever.  Follow Up Instructions:    I discussed the assessment and treatment plan with the patient. The patient was provided an opportunity to ask questions and all were answered. The patient agreed with the plan and demonstrated an understanding of the instructions.   The patient was advised to call back or seek an in-person evaluation if the symptoms worsen or if the condition fails to improve as anticipated.      Time spent today with pt was  45 minutes which  consisted of chart review,  discussing potential differential dx of fever, needed work up/ potential treatment plans and documentation.   Mackie Pai, PA-C   Review of Systems  Constitutional: Positive for fever. Negative for chills and fatigue.  HENT: Negative for congestion and dental problem.   Respiratory: Negative for cough, chest tightness, shortness of breath and wheezing.   Cardiovascular: Negative for chest pain and palpitations.  Gastrointestinal: Negative for abdominal distention and anal bleeding.       Off and on left lower quadrant pain for about 2-3 weeks. No blood in stool, no bloody stools and no constipation diarrhea.  But no prominent today.  Genitourinary: Negative for dysuria.  Musculoskeletal: Negative for  back pain.  Skin: Negative for rash.  Neurological: Negative for dizziness and headaches.  Hematological: Negative for adenopathy. Does not bruise/bleed easily.  Psychiatric/Behavioral: Negative for behavioral problems and confusion.   Past Medical History:  Diagnosis Date  . Anemia   . Anxiety   . ASD (atrial septal defect) 06/11/2014   S/p patch repair  . Atherosclerosis of abdominal aorta ([place]) 06/11/2014   Age advanced atherosclerosis of the infrarenal aorta  . Depression   . Dysmenorrhea   . Dysuria   . Encounter for long-term (current) use of other medications   . Fibromyalgia   . Headache   . Hematuria   . High cholesterol   . Left knee pain   . Migraine   . Other malaise and fatigue   . Pure hyperglyceridemia   . Routine general medical examination at a health care facility   . Shoulder pain   . Vitamin D deficiency   . Vitamin D deficiency   . VSD (ventricular septal defect), multiple 06/11/2014   S/p patch repair     Social History   Socioeconomic History  . Marital status: Married    Spouse name: Not on file  . Number of children: 4  . Years of education: College  . Highest education level: Not on file  Occupational History  . 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 Smoking status: Never Smoker  . Smokeless tobacco: Never Used  Substance and Sexual Activity  . Alcohol use: No    Alcohol/week: 0.0 standard drinks  . Drug use: No  . Sexual activity: Not on file  Other Topics Concern  . Not on file  Social History Narrative   Lives at home with husband and children.   Right-handed.   Social Determinants of Health   Financial Resource Strain:   . Difficulty of Paying Living Expenses:   Food Insecurity:   . Worried About Charity fundraiser in the Last Year:   . Arboriculturist in the Last Year:   Transportation Needs:   . Film/video editor (Medical):   Marland Kitchen Lack of Transportation (Non-Medical):   Physical Activity:   . Days of  Exercise per Week:   . Minutes of Exercise per Session:   Stress:   . Feeling of Stress :   Social Connections:   . Frequency of Communication with Friends and Family:   . Frequency of Social Gatherings with Friends and Family:   . Attends Religious Services:   . Active Member of Clubs or Organizations:   . Attends Archivist Meetings:   Marland Kitchen Marital Status:   Intimate Partner Violence:   . Fear of Current or Ex-Partner:   . Emotionally Abused:   Marland Kitchen Physically  Abused:   . Sexually Abused:     Past Surgical History:  Procedure Laterality Date  . c seaction    . CARDIAC SURGERY     as child  . CESAREAN SECTION    . KNEE ARTHROSCOPY W/ ACL RECONSTRUCTION AND HAMSTRING GRAFT Left   . vetricular hole      Family History  Problem Relation Age of Onset  . Depression Mother   . High blood pressure Mother   . Hyperlipidemia Mother   . Anxiety disorder Mother   . Hypertension Mother   . High blood pressure Father   . Hyperlipidemia Father   . Hypertension Father   . Crohn's disease Father   . Kidney disease Father   . Peripheral vascular disease Father   . Peripheral vascular disease Paternal Grandfather     Allergies  Allergen Reactions  . Banana Other (See Comments)    Headaches  . Nsaids Nausea Only and Other (See Comments)    Also caused stomach ulcers (can only tolerate in limited doses)  . Other Other (See Comments)    SSRI(s): Makes the patient feel "disconnected" Tricyclic antidepressants- Muscle spasms Sulfates- Itching  . Sulfa Antibiotics Hives    "gums peeled off"    Current Outpatient Medications on File Prior to Visit  Medication Sig Dispense Refill  . acetaminophen (TYLENOL) 650 MG CR tablet Take 650 mg by mouth every 8 (eight) hours as needed for pain. Reported on 07/01/2015    . Albuterol Sulfate (PROAIR RESPICLICK) 108 (90 Base) MCG/ACT AEPB Inhale 1-2 puffs into the lungs every 6 (six) hours. 1 each 0  . clonazePAM (KLONOPIN) 0.5 MG tablet  Take 1 tablet (0.5 mg total) by mouth 2 (two) times daily as needed for anxiety. 60 tablet 1  . cyclobenzaprine (FLEXERIL) 5 MG tablet 1-2 tab po q hs as needed muscle spasms 20 tablet 1  . fluticasone (FLONASE) 50 MCG/ACT nasal spray SPRAY 2 SPRAYS INTO EACH NOSTRIL EVERY DAY 16 mL 5  . predniSONE (DELTASONE) 10 MG tablet 4 tab po day 1, 3 tab po day 2, 2 tab po day 3, 1 tab po day 4. 10 tablet 0  . pregabalin (LYRICA) 150 MG capsule Take 1 capsule (150 mg total) by mouth 2 (two) times daily. 60 capsule 2  . SUMAtriptan (IMITREX) 50 MG tablet Take 1 tablet (50 mg total) by mouth every 2 (two) hours as needed for migraine. May repeat in 2 hours if headache persists or recurs. 10 tablet 2  . traMADol (ULTRAM) 50 MG tablet TAKE 1 TABLET BY MOUTH EVERY 8 HOURS AS NEEDED FOR PAIN 90 tablet 0  . vitamin B-12 (CYANOCOBALAMIN) 1000 MCG tablet Take 1,000 mcg by mouth daily.    . vitamin C (ASCORBIC ACID) 500 MG tablet Take 2,000 mg by mouth daily.     No current facility-administered medications on file prior to visit.    There were no vitals taken for this visit.       Objective:   Physical Exam       Assessment & Plan:

## 2019-06-25 NOTE — Addendum Note (Signed)
Addended by: Gwenevere Abbot on: 06/25/2019 08:52 AM   Modules accepted: Orders

## 2019-06-29 ENCOUNTER — Other Ambulatory Visit: Payer: Self-pay

## 2019-06-29 ENCOUNTER — Ambulatory Visit: Payer: 59 | Attending: Internal Medicine

## 2019-07-24 ENCOUNTER — Telehealth: Payer: Self-pay

## 2019-07-24 ENCOUNTER — Ambulatory Visit: Payer: 59 | Admitting: Medical

## 2019-07-24 NOTE — Telephone Encounter (Signed)
Spoke with patient regarding appointment/symptoms.  Patient is scheduled for facial/eye drooping since last night, recently received J&J COVID vaccine. Also reports dizziness.  Per PCP, advised patient to go to the Emergency Room for evaluation and treatment. Explained to patient imaging is readily available at the hospital as well as immediate treatment.  Patient reluctant, however does agree to go to the ER. Plans to have her family drive her to [facility] today.

## 2019-07-28 ENCOUNTER — Telehealth: Payer: Self-pay | Admitting: Medical

## 2019-07-28 ENCOUNTER — Telehealth (INDEPENDENT_AMBULATORY_CARE_PROVIDER_SITE_OTHER): Payer: 59 | Admitting: Medical

## 2019-07-28 ENCOUNTER — Other Ambulatory Visit: Payer: Self-pay

## 2019-07-28 ENCOUNTER — Ambulatory Visit (HOSPITAL_BASED_OUTPATIENT_CLINIC_OR_DEPARTMENT_OTHER)
Admission: RE | Admit: 2019-07-28 | Discharge: 2019-07-28 | Disposition: A | Discharge status: Home / Self Care | Payer: 59 | Source: Ambulatory Visit | Attending: Medical | Admitting: Medical

## 2019-07-28 ENCOUNTER — Other Ambulatory Visit (HOSPITAL_BASED_OUTPATIENT_CLINIC_OR_DEPARTMENT_OTHER): Payer: Self-pay | Admitting: Medical

## 2019-07-28 VITALS — HR 62

## 2019-07-28 DIAGNOSIS — R42 Dizziness and giddiness: Secondary | ICD-10-CM

## 2019-07-28 DIAGNOSIS — R112 Nausea with vomiting, unspecified: Secondary | ICD-10-CM

## 2019-07-28 DIAGNOSIS — H814 Vertigo of central origin: Secondary | ICD-10-CM | POA: Insufficient documentation

## 2019-07-28 DIAGNOSIS — R519 Headache, unspecified: Secondary | ICD-10-CM | POA: Insufficient documentation

## 2019-07-28 LAB — COMPREHENSIVE METABOLIC PANEL
AG Ratio: 1.6 (calc) (ref 1.0–2.5)
ALT: 18 U/L (ref 6–29)
AST: 18 U/L (ref 10–35)
Albumin: 4.1 g/dL (ref 3.6–5.1)
Alkaline phosphatase (APISO): 73 U/L (ref 37–153)
BUN: 12 mg/dL (ref 7–25)
CO2: 29 mmol/L (ref 20–32)
Calcium: 9.4 mg/dL (ref 8.6–10.4)
Chloride: 102 mmol/L (ref 98–110)
Creat: 0.67 mg/dL (ref 0.50–1.05)
Globulin: 2.5 g/dL (calc) (ref 1.9–3.7)
Glucose, Bld: 86 mg/dL (ref 65–99)
Potassium: 4.4 mmol/L (ref 3.5–5.3)
Sodium: 139 mmol/L (ref 135–146)
Total Bilirubin: 0.4 mg/dL (ref 0.2–1.2)
Total Protein: 6.6 g/dL (ref 6.1–8.1)

## 2019-07-28 LAB — CBC WITH DIFFERENTIAL/PLATELET
Absolute Monocytes: 764 cells/uL (ref 200–950)
Basophils Absolute: 42 cells/uL (ref 0–200)
Basophils Relative: 0.5 %
Eosinophils Absolute: 176 cells/uL (ref 15–500)
Eosinophils Relative: 2.1 %
HCT: 37.7 % (ref 35.0–45.0)
Hemoglobin: 12.6 g/dL (ref 11.7–15.5)
Lymphs Abs: 3368 cells/uL (ref 850–3900)
MCH: 29.9 pg (ref 27.0–33.0)
MCHC: 33.4 g/dL (ref 32.0–36.0)
MCV: 89.3 fL (ref 80.0–100.0)
MPV: 11.4 fL (ref 7.5–12.5)
Monocytes Relative: 9.1 %
Neutro Abs: 4049 cells/uL (ref 1500–7800)
Neutrophils Relative %: 48.2 %
Platelets: 211 10*3/uL (ref 140–400)
RBC: 4.22 10*6/uL (ref 3.80–5.10)
RDW: 12.6 % (ref 11.0–15.0)
Total Lymphocyte: 40.1 %
WBC: 8.4 10*3/uL (ref 3.8–10.8)

## 2019-07-28 MED ORDER — MECLIZINE HCL 12.5 MG PO TABS: 12.5000 mg | ORAL_TABLET | Freq: Three times a day (TID) | ORAL | 0 refills | Status: AC | PRN

## 2019-07-28 NOTE — Telephone Encounter (Signed)
Pt at imaging now. Per notes the Berk[facility] Harvey was approved. I have now documented it in the referral. Pt going back for CT.

## 2019-07-28 NOTE — Telephone Encounter (Signed)
Will you look at pt ct head order and get prior authorization. Want pt to get it today. Around 400 if possible.

## 2019-07-28 NOTE — Addendum Note (Signed)
Addended by: Harley Alto on: 07/28/2019 04:05 PM   Modules accepted: Orders

## 2019-07-28 NOTE — Patient Instructions (Signed)
Patient has various signs and symptoms with onset started after [employer] Covid vaccine.  She was advised to go to the ED on Friday but declined.  Her signs and symptoms persist.  Have concern for differential diagnosis of CVA, early atypical Bell's palsy and more rare side effect of recently reported possible cerebral venous sinus thrombosis.  Has been thinning  So I wanted her to be seen in ED for possible imaging studies thinks ED would start with CT and maybe even get mri in light potential challenges in making diagnosis of cvst.  I will get cbc and cmp stat today and try to get ct head without contrast stat later this afternoon. Doing this out patient as she refused ED evaluation.  If dizziness persist despite meclizine or other symptoms worsen/change then may again advise ED evaluation for further imaging. Pt made aware that in the end may still advise ED.  Follow up to be determined after lab and ct review.

## 2019-07-28 NOTE — Progress Notes (Addendum)
Subjective:    Patient ID: [name], female    DOB: Nov 04, 1965, 54 y.o.   MRN: [id number]  HPI   Virtual Visit via Video Note  I connected with Luzmaria Devaux Liberman on 07/28/19 at 11:00 AM EDT by a video enabled telemedicine application and verified that I am speaking with the correct person using two identifiers.  Location: Patient: home Provider: office  Pt bp cuff not working.   I discussed the limitations of evaluation and management by telemedicine and the availability of in person appointments. The patient expressed understanding and agreed to proceed.   I discussed the assessment and treatment plan with the patient. The patient was provided an opportunity to ask questions and all were answered. The patient agreed with the plan and demonstrated an understanding of the instructions.   The patient was advised to call back or seek an in-person evaluation if the symptoms worsen or if the condition fails to improve as anticipated.     Esperanza Richters, PA-C   Pt in for follow up.  Pt had sometimes since past weekend. Timing occurred after covid vaccine.  She had some anxiety about some symptoms after [employer] vaccine. Past week felt fatigue and off balance. On Friday felt dizzy worse feeling and light headed worse.(still dizzy today and effecting balance) Pt thought had her eye felt like mild drooping. She still feels mild foggy headed  but less.. I had advised pt through RN that work with our office to be seen at ED. Last Friday in afternoon I had concerns for possible rare side effect of [employer] vaccine.   When I got message had concern for bells palsy, cva or  cerebbral venous sinus thrombosis. This was based on reading her complaints and  eye drooping. Husband tells her eye eye looks drooping.   I told RN Selena Batten to advise her ED since imaging would be challenge in terms of prior authorization particularly regards to ordering appropiate test for rare central venous thrombosis.    Pt has had ha. She has some left ear pain and mild left side maxillary pain.   Pt just refuses to go to ED unless she worsens. Admits being stubborn.  Review of Systems  Constitutional: Positive for fatigue. Negative for chills and fever.  Respiratory: Negative for cough, chest tightness, shortness of breath and wheezing.   Cardiovascular: Negative for chest pain and palpitations.  Gastrointestinal: Negative for abdominal pain.  Musculoskeletal: Negative for back pain.  Skin: Negative for rash.  Neurological: Positive for dizziness and light-headedness. Negative for syncope and weakness.    Past Medical History:  Diagnosis Date  . Anemia   . Anxiety   . ASD (atrial septal defect) 06/11/2014   S/p patch repair  . Atherosclerosis of abdominal aorta (HCC) 06/11/2014   Age advanced atherosclerosis of the infrarenal aorta  . Depression   . Dysmenorrhea   . Dysuria   . Encounter for long-term (current) use of other medications   . Fibromyalgia   . Headache   . Hematuria   . High cholesterol   . Left knee pain   . Migraine   . Other malaise and fatigue   . Pure hyperglyceridemia   . Routine general medical examination at a health care facility   . Shoulder pain   . Vitamin D deficiency   . Vitamin D deficiency   . VSD (ventricular septal defect), multiple 06/11/2014   S/p patch repair     Social History  Socioeconomic History  . Marital status: Married    Spouse name: Not on file  . Number of children: 4  . Years of education: College  . Highest education level: Not on file  Occupational History  . 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 Smoking status: Never Smoker  . Smokeless tobacco: Never Used  Substance and Sexual Activity  . Alcohol use: No    Alcohol/week: 0.0 standard drinks  . Drug use: No  . Sexual activity: Not on file  Other Topics Concern  . Not on file  Social History Narrative   Lives at home with husband and children.    Right-handed.   Social Determinants of Health   Financial Resource Strain:   . Difficulty of Paying Living Expenses:   Food Insecurity:   . Worried About Programme researcher, broadcasting/film/video in the Last Year:   . Barista in the Last Year:   Transportation Needs:   . Freight forwarder (Medical):   Marland Kitchen Lack of Transportation (Non-Medical):   Physical Activity:   . Days of Exercise per Week:   . Minutes of Exercise per Session:   Stress:   . Feeling of Stress :   Social Connections:   . Frequency of Communication with Friends and Family:   . Frequency of Social Gatherings with Friends and Family:   . Attends Religious Services:   . Active Member of Clubs or Organizations:   . Attends Banker Meetings:   Marland Kitchen Marital Status:   Intimate Partner Violence:   . Fear of Current or Ex-Partner:   . Emotionally Abused:   Marland Kitchen Physically Abused:   . Sexually Abused:     Past Surgical History:  Procedure Laterality Date  . c seaction    . CARDIAC SURGERY     as child  . CESAREAN SECTION    . KNEE ARTHROSCOPY W/ ACL RECONSTRUCTION AND HAMSTRING GRAFT Left   . vetricular hole      Family History  Problem Relation Age of Onset  . Depression Mother   . High blood pressure Mother   . Hyperlipidemia Mother   . Anxiety disorder Mother   . Hypertension Mother   . High blood pressure Father   . Hyperlipidemia Father   . Hypertension Father   . Crohn's disease Father   . Kidney disease Father   . Peripheral vascular disease Father   . Peripheral vascular disease Paternal Grandfather     Allergies  Allergen Reactions  . Banana Other (See Comments)    Headaches  . Nsaids Nausea Only and Other (See Comments)    Also caused stomach ulcers (can only tolerate in limited doses)  . Other Other (See Comments)    SSRI(s): Makes the patient feel "disconnected" Tricyclic antidepressants- Muscle spasms Sulfates- Itching  . Sulfa Antibiotics Hives    "gums peeled off"    Current  Outpatient Medications on File Prior to Visit  Medication Sig Dispense Refill  . acetaminophen (TYLENOL) 650 MG CR tablet Take 650 mg by mouth every 8 (eight) hours as needed for pain. Reported on 07/01/2015    . Albuterol Sulfate (PROAIR RESPICLICK) 108 (90 Base) MCG/ACT AEPB Inhale 1-2 puffs into the lungs every 6 (six) hours. 1 each 0  . clonazePAM (KLONOPIN) 0.5 MG tablet Take 1 tablet (0.5 mg total) by mouth 2 (two) times daily as needed for anxiety. 60 tablet 1  . cyclobenzaprine (FLEXERIL) 5 MG tablet 1-2 tab  po q hs as needed muscle spasms 20 tablet 1  . fluticasone (FLONASE) 50 MCG/ACT nasal spray SPRAY 2 SPRAYS INTO EACH NOSTRIL EVERY DAY 16 mL 5  . predniSONE (DELTASONE) 10 MG tablet 4 tab po day 1, 3 tab po day 2, 2 tab po day 3, 1 tab po day 4. 10 tablet 0  . pregabalin (LYRICA) 150 MG capsule Take 1 capsule (150 mg total) by mouth 2 (two) times daily. 60 capsule 2  . SUMAtriptan (IMITREX) 50 MG tablet Take 1 tablet (50 mg total) by mouth every 2 (two) hours as needed for migraine. May repeat in 2 hours if headache persists or recurs. 10 tablet 2  . traMADol (ULTRAM) 50 MG tablet TAKE 1 TABLET BY MOUTH EVERY 8 HOURS AS NEEDED FOR PAIN 90 tablet 0  . vitamin B-12 (CYANOCOBALAMIN) 1000 MCG tablet Take 1,000 mcg by mouth daily.    . vitamin C (ASCORBIC ACID) 500 MG tablet Take 2,000 mg by mouth daily.     No current facility-administered medications on file prior to visit.    There were no vitals taken for this visit.      Objective:   Physical Exam  General General-no acute distress, pleasant, oriented. Normal speech Lungs- on inspection lungs appear unlabored. Neck- no tracheal deviation or jvd on inspection. Neuro- gross motor function appears intact. Neurologic Cranial Nerve exam:- CN III-XII intact(No nystagmus), symmetric smile. Sensation in face intact. Drift Test:- No drift. Romberg Exam:- difficulty doing on video  Finger to Nose:- Normal/Intact. On close  inspection by video I barely notice minimal left upper eye lid droop at best.        Assessment & Plan:  Patient has various signs and symptoms with onset started after [place] vaccine.  She was advised to go to the ED on Friday but declined.  Her signs and symptoms persist.  Have concern for differential diagnosis of CVA, early atypical Bell's palsy and more rare side effect of recently reported possible cerebral venous sinus thrombosis.  Has been thinning  So I wanted her to be seen in ED for possible imaging studies thinks ED would start with CT and maybe even get mri in light potential challenges in making diagnosis of cvst.  I will get cbc and cmp stat today and try to get ct head without contrast stat later this afternoon. Doing this out patient as she refused ED evaluation.  If dizziness persist despite meclizine or other symptoms worsen/change then may again advise ED evaluation for further imaging. Pt made aware that in the end may still advise ED.  Follow up to be determined after lab and ct review.  Time spent with patient today was  45 minutes which consisted of chart review, discussing differential diagnosis, work up, treatment,my reasons why thought ED was better option  and documentation.  Pt so dizzy can't stand. When talked. I did explain to her that if she does not improve by time study comes back then will be recommending getting further evaluation for possible mri at an emergency dept. She wants to get something to eat. I told her she can get something but is signs/symptoms change of worsen then come back to [facility]. Work up being done out patient since Friday she did not go to ED as advised.   [phone].  Mackie Pai, PA-C

## 2019-07-28 NOTE — Telephone Encounter (Signed)
Opened to review 

## 2019-07-31 ENCOUNTER — Encounter: Payer: Self-pay | Admitting: Medical

## 2019-07-31 MED ORDER — ENOXAPARIN SODIUM 40 MG/0.4ML ~~LOC~~ SOLN
40.00 | SUBCUTANEOUS | Status: DC
Start: 2019-07-31 — End: 2019-07-31

## 2019-07-31 MED ORDER — PREGABALIN 150 MG PO CAPS
150.00 | ORAL_CAPSULE | ORAL | Status: DC
Start: 2019-07-30 — End: 2019-07-31

## 2019-07-31 MED ORDER — ATORVASTATIN CALCIUM 40 MG PO TABS
80.00 | ORAL_TABLET | ORAL | Status: DC
Start: 2019-07-31 — End: 2019-07-31

## 2019-07-31 MED ORDER — ASPIRIN 81 MG PO TBEC
81.00 | DELAYED_RELEASE_TABLET | ORAL | Status: DC
Start: ? — End: 2019-07-31

## 2019-07-31 MED ORDER — METHOCARBAMOL 500 MG PO TABS
500.00 | ORAL_TABLET | ORAL | Status: DC
Start: 2019-07-31 — End: 2019-07-31

## 2019-07-31 MED ORDER — ACETAMINOPHEN 325 MG PO TABS
650.00 | ORAL_TABLET | ORAL | Status: DC
Start: ? — End: 2019-07-31

## 2019-07-31 MED ORDER — CLONAZEPAM 0.125 MG PO TBDP
0.50 | ORAL_TABLET | ORAL | Status: DC
Start: ? — End: 2019-07-31

## 2019-07-31 MED ORDER — ONDANSETRON HCL 4 MG PO TABS
4.00 | ORAL_TABLET | ORAL | Status: DC
Start: ? — End: 2019-07-31

## 2019-07-31 MED ORDER — PROMETHAZINE HCL 12.5 MG PO TABS
12.50 | ORAL_TABLET | ORAL | Status: DC
Start: ? — End: 2019-07-31

## 2019-08-02 ENCOUNTER — Other Ambulatory Visit: Payer: Self-pay | Admitting: Medical

## 2019-08-03 ENCOUNTER — Other Ambulatory Visit: Payer: Self-pay | Admitting: Medical

## 2019-08-03 ENCOUNTER — Encounter: Payer: Self-pay | Admitting: Medical

## 2019-08-05 ENCOUNTER — Other Ambulatory Visit: Payer: Self-pay | Admitting: Medical

## 2019-08-05 MED ORDER — TRAMADOL HCL 50 MG PO TABS: ORAL_TABLET | ORAL | 0 refills | Status: DC

## 2019-08-05 NOTE — Telephone Encounter (Signed)
Tramadol refill sent to pt pharmacy.

## 2019-08-13 ENCOUNTER — Inpatient Hospital Stay: Payer: 59 | Admitting: Medical

## 2019-08-13 NOTE — Progress Notes (Signed)
Subjective:    Patient ID: Joanna Anderson, female    DOB: [birth year], 54 y.o.   MRN: [id number]  HPI  Pt in for follow up from ED.   Below portions of dc summary from [facility]  Admitting Service: Stroke Neurology Admit date: 07/29/2019 Admission Diagnosis/Symptoms: Vertigo, left hemifacial and left hemibody numbness Admitting Physician: Tami Ribas, MD  Indication for Admission: Concern for stroke  Discharge Summary: Discharge Service: Stroke neurology Discharge Date: 07/30/2019  Discharge Physician: Montine Circle Garen Grams, MD  Primary Discharge Diagnosis: Migraine Secondary Discharge Diagnosis/Complications/Co-Morbidities: AVM, anxiety, fibromyalgia, migraines   Discharged Condition: Stable  Hospital Course For full details, please see H&P, progress notes, consult notes and ancillary notes.  Briefly, Joanna Anderson is a 54 y.o. female with a history significant for anxiety, fibromyalgia, migraines, who presented to [facility] 07/29/2019 for one week h/o vertigo beginning 4/22, also with transient LFD on 4/22 and residual L facial numbness. In brief, the patient noticed abrupt onset of left facial droop, dizziness, nausea, tinnitus, and left facial numbness on 4/22 at 7pm. The facial droop has waxed and waned since initial onset, but the dizziness, nausea, tinnitus, and facial numbness have been consistent since onset. During this period, her chronic migraines had worsened. The patient also endorses a numbness in her LLE, though she has LLE numbness at baseline.  Patient's migraines are bilateral, frontal, and severe (8+ out of 10). She says she has 10 HA free days per month for the past three months. She currently takes sumatriptan and tramadol as abortive medications, but no preventative migraine medications. She does take lyrica daily, though for fibromyalgia. In the past, she has discontinued amitriptyline for mental fog and topiramate  because it didn't work. She says that gabapentin has controlled her migraines in the past. She also is prescribed phenergan and zofran for nausea from her headaches.   Upon initial exam, the patient was found to have NIHSS of 3 due to left hemibody dysmetria and numbness. Head CT showed no acute intracranial abnormalities. The patient was admitted to the neurology stroke service for further evaluation and management.   STROKE WORK-UP Brain Imaging ? CTH (4/28): No acute intracranial hemorrhage or evidence of acute large vessel tori infarct ? MRI (4/29): No acute intracranial abnormality. Nonspecific scattered T2/flair hyperintense white matter lesions, typically attributed to chronic microvascular ischemic changes Vessel Imaging ? CTA (4/28): Substantial venous contamination limits evaluation. There is a prominent vein of Labbe which comes in close proximity to left MCA branches. This could represent the patient's baseline; however, if there is clinical concern for dural arteriovenous fistula, catheter angiogram could further evaluate. ? CTP (4/28): No perfusion defect Cardiovascular Diagnostics ? Telemetry/EKG did not show evidence of arrhythmia ? TTE (4/29): LVEF 50-55%; no shunt  Risk stratification labs were obtained as follows: Lab Results  Component Value Date  HBA1C 5.3 07/29/2019   Lab Results  Component Value Date  TSH 3.334 07/29/2019   Lab Results  Component Value Date  CHOL 298 (H) 07/29/2019  HDL 47 07/29/2019  LDL 205 (H) 07/29/2019  LDL 185 11/13/2017  TRIG 258 (H) 07/29/2019   Further work-up as per above, which included brain MRI which showed no acute stroke. Given aforementioned workup the patient's presenting symptoms were felt to be most likely secondary to migraine. Her symptoms improved with administration of a migraine cocktail. She was prescribed a steroid taper to take as an outpatient to help with her headaches until follow-up in outpatient  general neurology  clinic. Recommend further evaluation of her headaches and optimization of her headache treatment regimen at that time.  Due to presence of risk factors and presence of vascular disease on brain MRI, she was started on aspirin 81 mg daily and Lipitor for stroke prevention.  Therapy evaluations were not indicated as patient returned to her baseline and was able to ambulate without assistance prior to discharge.. On 07/30/2019, the patient was deemed medically stable for discharge from the hospital to Home with close PCP and general neurology follow up.  Follow up Action items: --Follow up appointments requested with PCP and neurology --Recommend continued medical management of stroke risk factors:  Check lipid panel in 6-9 months Check Hgb A1c in 6 months  Regular BP monitoring  Other medical conditions discussed below in problem based format:  OTHER HOSPITAL PROBLEMS  #Incidental finding on CTA head and neck --CTA at admission was initially concerning for right dural AV fistula, for which neurosurgery was consulted and a DSA for further evaluation was planned. However after neurosurgery review and final radiology the finding was felt to be unlikely to represent a fistula, so per neurosurgery, invasive imaging was not indicated at that time.  CHRONIC MEDICAL PROBLEMS Patient was restarted on her home medications for chronic medical conditions, other than changes stated in this discharge summary.   Discharge Examination GENERAL: Awake, alert, no acute distress. EYES: Pupils: pupils equally round, reactive to light. ENT: Oropharynx clear, mucus membranes moist. CARDIOVASCULAR: Not tachycardic  RESPIRATORY: Respirations unlabored, chest rise symmetric. GASTROINTESTINAL: Abdomen: benign SKIN: No new rashes or lesions appreciated.  MENTAL STATUS EXAM:  Orientation: Alert and oriented to person, place, and time. Memory: Cooperative, follows commands well. Recent and remote memory  normal. Attention, concentration: Attention span and concentration are normal.  Language: Speech is clear and language is normal.  Fund of knowledge: Aware of current events, vocabulary appropriate for patient age.   CRANIAL NERVES:  CN 2 (Optic): Visual fields intact to confrontation  CN 3,4,6 (EOM): Pupils equal and reactive to light. Full extraocular eye movement without nystagmus.  CN 5 (Trigeminal): Subjective numbness of left hemiface CN 7 (Facial): No facial weakness or asymmetry.  CN 8 (Auditory): Auditory acuity intact to voice. CN 9,10 (Glossophar): Palate elevation symmetric, no uvular deviation.  CN 11 (spinal access): Head is midline.  CN 12 (Hypoglossal): The tongue is midline. No atrophy or fasciculations.   MOTOR: Muscle Strength: Strength - 5/5 and symmetric in the upper and lower extremities, no pronation or drift.  Muscle Tone: Tone and muscle bulk are normal in the upper and lower extremities.   SENSATION: Intact and symmetric to light touch in bilateral upper and lower extremities.  GAIT: Deferred.  Patient's Ordered Code Status: Full Code  TPA administered?  Patient was not a candidate for IV tPA due to the following contraindications, warnings and/or other factors: arrival outside of tpa window   Rehabilitation Assessment: not indicated   Stroke-Related Medication Core Measures: LDL Direct  Date Value Ref Range Status  07/29/2019 205 (H) <130 mg/dL Final  Comment:  Please consider Familial Hypercholesterolemia in the differential diagnosis for this patient with LDL-C >=190 mg/dL, provided secondary causes of hyperlipidemia (e.g., hypothyroidism, renal disease, etc) have been excluded.   If LDL is greater than or equal to 70: Is patient being discharged on a statin? Yes Atrial Fibrillation:   Discharged on an antithrombotic? Yes Discharged on an anticoagulant? No, Why? not indicated  Disposition: Home  Post-Discharge Follow-up/Action Items Please  refer to bolded and underlined instructions in body of text.  Patient Instructions:  Discharge Medications: Discharge Medication List as of 07/30/2019 9:54 PM   START taking these medications  Details  aspirin 81 MG EC tablet *ANTIPLATELET* Take 1 tablet (81 mg total) by mouth daily., Starting Fri 07/31/2019, Normal   atorvastatin (LIPITOR) 80 MG tablet Take 1 tablet (80 mg total) by mouth daily at 6pm., Starting Thu 07/30/2019, Normal   methylPREDNISolone (MEDROL) 4 mg tablet follow package directions, Normal    CONTINUE these medications which have NOT CHANGED  Details  cholecalciferol, vitamin D3, (VITAMIN D3) 2,000 unit Cap Take 1 Can by mouth daily. , Historical Med   cyclobenzaprine (FLEXERIL) 5 MG tablet 1-2 tab po q hs as needed muscle spasms, Historical Med   fluticasone (FLONASE) 50 mcg/actuation nasal spray 1 spray by Nasal route daily as needed for Rhinitis., Historical Med   methocarbamol (ROBAXIN) 500 MG tablet Take 500 mg by mouth nightly as needed (for muscle spasms). , Historical Med   ondansetron (ZOFRAN) 4 MG tablet Take 4 mg by mouth every 8 (eight) hours as needed for Nausea / Vomiting., Historical Med   pregabalin (LYRICA) 150 MG capsule TAKE 1 CAPSULE BY MOUTH TWICE A DAY, Historical Med   promethazine (PHENERGAN) 12.5 MG tablet Take 12.5 mg by mouth every 8 (eight) hours as needed for Nausea., Historical Med   SUMAtriptan (IMITREX) 50 MG tablet Take 50 mg by mouth every 2 (two) hours as needed for Migraine. Maximum 200 mg/day., Historical Med   traMADol (ULTRAM) 50 mg tablet Take 50 mg by mouth every 8 (eight) hours as needed for Pain. , Historical Med    STOP taking these medications   HYDROcodone-acetaminophen (NORCO) 5-325 mg per tablet   b complex vitamins capsule   clonazePAM (KLONOPIN) 0.5 MG disintegrating tablet   co-enzyme Q-10 30 mg capsule   cyanocobalamin, vitamin B-12, (VITAMIN B-12) 2,500 mcg Subl   gabapentin (NEURONTIN) 300 MG  capsule   sucralfate (CARAFATE) 1 gram tablet     Follow-up Appointments [facility] APPOINTMENTS  Appointment Date and Time Provider Department Dept Phone Address  07/30/2019 1:00 PM [facility] IR 110 Interventional Radiology Imaging - [facility] [phone] [facility] [place] [address]   Pt had appointment with neurologist yesterday. Pt saw NP who really did not mention need for surgery with neurosurgeon.  Pt was referred for sleep study and she was also given magnesium. Yesterday hx of migraine discussed with neurologist NP. Pt states some neurologist in past thought she may have trigeminal neuralgia. At time severe pain. Neurologist NP mentioned they might give gabapentin in future.   Prior dizziness before ED evaluation and left eye droop resolved. Now states only faint/slight dizziness. No gross motor or sensory function deficits.  Hx of tinnitus both sides for years per pt. Seems more prevelent since she had j and j vaccine.    Review of Systems  Constitutional: Negative for chills, fatigue and fever.  HENT: Negative for congestion and facial swelling.   Respiratory: Negative for cough, chest tightness and shortness of breath.   Cardiovascular: Negative for chest pain and palpitations.  Gastrointestinal: Negative for abdominal pain.  Genitourinary: Negative for dysuria, flank pain and frequency.  Musculoskeletal: Negative for back pain and joint swelling.  Skin: Negative for rash.  Neurological: Negative for dizziness, seizures, syncope, weakness and headaches.       See hpi  Hematological: Negative for adenopathy. Does not bruise/bleed easily.  Psychiatric/Behavioral:  Negative for behavioral problems, decreased concentration and sleep disturbance.    Past Medical History:  Diagnosis Date  . Anemia   . Anxiety   . ASD (atrial septal defect) 06/11/2014   S/p patch repair  . Atherosclerosis of abdominal aorta (HCC) 06/11/2014    Age advanced atherosclerosis of the infrarenal aorta  . Depression   . Dysmenorrhea   . Dysuria   . Encounter for long-term (current) use of other medications   . Fibromyalgia   . Headache   . Hematuria   . High cholesterol   . Left knee pain   . Migraine   . Other malaise and fatigue   . Pure hyperglyceridemia   . Routine general medical examination at a health care facility   . Shoulder pain   . Vitamin D deficiency   . Vitamin D deficiency   . VSD (ventricular septal defect), multiple 06/11/2014   S/p patch repair     Social History   Socioeconomic History  . Marital status: Married    Spouse name: Not on file  . Number of children: 4  . Years of education: College  . Highest education level: Not on file  Occupational History  . 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 Smoking status: Never Smoker  . Smokeless tobacco: Never Used  Substance and Sexual Activity  . Alcohol use: No    Alcohol/week: 0.0 standard drinks  . Drug use: No  . Sexual activity: Not on file  Other Topics Concern  . Not on file  Social History Narrative   Lives at home with husband and children.   Right-handed.   Social Determinants of Health   Financial Resource Strain:   . Difficulty of Paying Living Expenses:   Food Insecurity:   . Worried About Programme researcher, broadcasting/film/videounning Out of Food in the Last Year:   . Baristaan Out of Food in the Last Year:   Transportation Needs:   . Freight forwarderLack of Transportation (Medical):   Marland Kitchen. Lack of Transportation (Non-Medical):   Physical Activity:   . Days of Exercise per Week:   . Minutes of Exercise per Session:   Stress:   . Feeling of Stress :   Social Connections:   . Frequency of Communication with Friends and Family:   . Frequency of Social Gatherings with Friends and Family:   . Attends Religious Services:   . Active Member of Clubs or Organizations:   . Attends BankerClub or Organization Meetings:   Marland Kitchen. Marital Status:   Intimate Partner Violence:   . Fear of Current or  Ex-Partner:   . Emotionally Abused:   Marland Kitchen. Physically Abused:   . Sexually Abused:     Past Surgical History:  Procedure Laterality Date  . c seaction    . CARDIAC SURGERY     as child  . CESAREAN SECTION    . KNEE ARTHROSCOPY W/ ACL RECONSTRUCTION AND HAMSTRING GRAFT Left   . vetricular hole      Family History  Problem Relation Age of Onset  . Depression Mother   . High blood pressure Mother   . Hyperlipidemia Mother   . Anxiety disorder Mother   . Hypertension Mother   . High blood pressure Father   . Hyperlipidemia Father   . Hypertension Father   . Crohn's disease Father   . Kidney disease Father   . Peripheral vascular disease Father   . Peripheral vascular disease Paternal Grandfather     Allergies  Allergen Reactions  . Banana Other (See Comments)    Headaches  . Nsaids Nausea Only and Other (See Comments)    Also caused stomach ulcers (can only tolerate in limited doses)  . Other Other (See Comments)    SSRI(s): Makes the patient feel "disconnected" Tricyclic antidepressants- Muscle spasms Sulfates- Itching  . Sulfa Antibiotics Hives    "gums peeled off"    Current Outpatient Medications on File Prior to Visit  Medication Sig Dispense Refill  . magnesium gluconate (MAGONATE) 500 MG tablet Take by mouth.    Marland Kitchen acetaminophen (TYLENOL) 650 MG CR tablet Take 650 mg by mouth every 8 (eight) hours as needed for pain. Reported on 07/01/2015    . Albuterol Sulfate (PROAIR RESPICLICK) 108 (90 Base) MCG/ACT AEPB Inhale 1-2 puffs into the lungs every 6 (six) hours. 1 each 0  . atorvastatin (LIPITOR) 80 MG tablet Take 80 mg by mouth daily.    . Cholecalciferol 50 MCG (2000 UT) CAPS Take by mouth.    . clonazePAM (KLONOPIN) 0.5 MG tablet Take 1 tablet (0.5 mg total) by mouth 2 (two) times daily as needed for anxiety. 60 tablet 1  . cyclobenzaprine (FLEXERIL) 5 MG tablet 1-2 tab po q hs as needed muscle spasms 20 tablet 1  . FLUoxetine (PROZAC) 40 MG capsule Take by  mouth.    . fluticasone (FLONASE) 50 MCG/ACT nasal spray SPRAY 2 SPRAYS INTO EACH NOSTRIL EVERY DAY 16 mL 5  . meclizine (ANTIVERT) 12.5 MG tablet Take 1 tablet (12.5 mg total) by mouth 3 (three) times daily as needed for dizziness. 30 tablet 0  . predniSONE (DELTASONE) 10 MG tablet 4 tab po day 1, 3 tab po day 2, 2 tab po day 3, 1 tab po day 4. 10 tablet 0  . pregabalin (LYRICA) 150 MG capsule Take 1 capsule (150 mg total) by mouth 2 (two) times daily. 60 capsule 2  . SUMAtriptan (IMITREX) 50 MG tablet Take 1 tablet (50 mg total) by mouth every 2 (two) hours as needed for migraine. May repeat in 2 hours if headache persists or recurs. 10 tablet 2  . traMADol (ULTRAM) 50 MG tablet TAKE 1 TABLET BY MOUTH EVERY 8 HOURS AS NEEDED FOR PAIN 90 tablet 0  . vitamin B-12 (CYANOCOBALAMIN) 1000 MCG tablet Take 1,000 mcg by mouth daily.    . vitamin C (ASCORBIC ACID) 500 MG tablet Take 2,000 mg by mouth daily.     No current facility-administered medications on file prior to visit.    BP (!) 115/47 (BP Location: Left Arm, Patient Position: Sitting, Cuff Size: Normal)   Pulse 69   Resp 18   Ht 5\' 6"  (1.676 m)   Wt 167 lb 12.8 oz (76.1 kg)   SpO2 95%   BMI 27.08 kg/m       Objective:   Physical Exam   General Mental Status- Alert. General Appearance- Not in acute distress.   Skin General: Color- Normal Color. Moisture- Normal Moisture.  Neck Carotid Arteries- Normal color. Moisture- Normal Moisture. No carotid bruits. No JVD.  Chest and Lung Exam Auscultation: Breath Sounds:-Normal.  Cardiovascular Auscultation:Rythm- Regular. Murmurs & Other Heart Sounds:Auscultation of the heart reveals- No Murmurs.  Abdomen Inspection:-Inspeection Normal. Palpation/Percussion:Note:No mass. Palpation and Percussion of the abdomen reveal- Non Tender, Non Distended + BS, no rebound or guarding.    Neurologic Cranial Nerve exam:- CN III-XII intact(No nystagmus), symmetric smile. Drift Test:- No  drift. Romberg Exam:- Negative.  Heal to Toe Gait exam:-Normal. Finger  to Nose:- Normal/Intact Strength:- 5/5 equal and symmetric strength both upper and lower extremities.      Assessment & Plan:  For history of migraines, continue with Imitrex magnesium and B vitamins as neurologist NP recommended.  I am also going to go ahead and refer you to Dr. Jaynee Eagles neurologist as would like to get her opinion regarding your imaging studies and your history of persistent/recurrent migraines.  Recent use of prednisone when you were in the emergency department/hospital.  Is possible that they were treating for possible Bell's palsy.  Glad to hear that your dizziness is significantly improved/almost resolved.  He has history of tinnitus for years but recently worsened after Covid vaccine.  New vaccine and may be side effect?  If changes persist/worsens could get ENT opinion.  For fibromyalgia continue current medication regimen.  For low O2 sats/hypoxia while sleeping we recommend he follow through with sleep study.  For depression well controlled recently continue with Prozac.  Follow-up in 1 month or as needed.  Mackie Pai, PA-C   Time spent with patient today was 40  minutes which consisted of chart review, discussing diagnoses,  Treatments, answering questions  and documentation.

## 2019-08-14 ENCOUNTER — Ambulatory Visit (INDEPENDENT_AMBULATORY_CARE_PROVIDER_SITE_OTHER): Payer: 59 | Admitting: Medical

## 2019-08-14 ENCOUNTER — Other Ambulatory Visit: Payer: Self-pay

## 2019-08-14 VITALS — BP 115/47 | HR 69 | Resp 18 | Ht 66.0 in | Wt 167.8 lb

## 2019-08-14 DIAGNOSIS — H9313 Tinnitus, bilateral: Secondary | ICD-10-CM | POA: Diagnosis not present

## 2019-08-14 DIAGNOSIS — R0902 Hypoxemia: Secondary | ICD-10-CM

## 2019-08-14 DIAGNOSIS — F32A Depression, unspecified: Secondary | ICD-10-CM

## 2019-08-14 DIAGNOSIS — R42 Dizziness and giddiness: Secondary | ICD-10-CM

## 2019-08-14 DIAGNOSIS — M797 Fibromyalgia: Secondary | ICD-10-CM | POA: Diagnosis not present

## 2019-08-14 DIAGNOSIS — G43809 Other migraine, not intractable, without status migrainosus: Secondary | ICD-10-CM | POA: Diagnosis not present

## 2019-08-14 DIAGNOSIS — F329 Major depressive disorder, single episode, unspecified: Secondary | ICD-10-CM

## 2019-08-14 MED ORDER — FLUOXETINE HCL 40 MG PO CAPS: 40.0000 mg | ORAL_CAPSULE | Freq: Every day | ORAL | 5 refills | Status: DC

## 2019-08-14 NOTE — Patient Instructions (Signed)
For history of migraines, continue with Imitrex magnesium and B vitamins as neurologist NP recommended.  I am also going to go ahead and refer you to Dr. Lucia Gaskins neurologist as would like to get her opinion regarding your imaging studies and your history of persistent/recurrent migraines.  Recent use of prednisone when you were in the emergency department/hospital.  Is possible that they were treating for possible Bell's palsy.  Glad to hear that your dizziness is significantly improved/almost resolved.  He has history of tinnitus for years but recently worsened after Covid vaccine.  New vaccine and may be side effect?  If changes persist/worsens could get ENT opinion.  For fibromyalgia continue current medication regimen.  For low O2 sats/hypoxia while sleeping we recommend he follow through with sleep study.  For depression well controlled recently continue with Prozac.  Follow-up in 1 month or as needed.

## 2019-08-26 ENCOUNTER — Telehealth: Payer: Self-pay | Admitting: Medical

## 2019-08-26 ENCOUNTER — Encounter: Payer: Self-pay | Admitting: Medical

## 2019-08-26 MED ORDER — FAMCICLOVIR 500 MG PO TABS: 500.0000 mg | ORAL_TABLET | Freq: Three times a day (TID) | ORAL | 0 refills | Status: AC

## 2019-08-26 NOTE — Telephone Encounter (Signed)
Famvir [facility] sent [facility] for cold sore not responding to abbreva.

## 2019-09-05 ENCOUNTER — Other Ambulatory Visit: Payer: Self-pay | Admitting: Medical

## 2019-09-08 ENCOUNTER — Encounter: Payer: Self-pay | Admitting: Medical

## 2019-09-10 ENCOUNTER — Other Ambulatory Visit: Payer: Self-pay | Admitting: Medical

## 2019-09-14 ENCOUNTER — Other Ambulatory Visit: Payer: Self-pay | Admitting: Medical

## 2019-09-14 NOTE — Telephone Encounter (Addendum)
Requesting: lyria Contract:11/03/2018 UDS:03/06/18 Last Visit:08/14/19 Next Visit:n/a Last Refill:06/16/19  Please Advise  She is also on tramadol. Both are controlled medications. She is overdue on contact. Need her to signs opiod contract and not opiod. Also will go ahead and update urine drug screen at same time. Can't give refills unless contracts updated. On review. Overdue by months.

## 2019-09-16 ENCOUNTER — Telehealth: Payer: Self-pay | Admitting: Medical

## 2019-09-16 DIAGNOSIS — F419 Anxiety disorder, unspecified: Secondary | ICD-10-CM

## 2019-09-16 NOTE — Telephone Encounter (Signed)
This is the patient that I had mentioned needs updated uds and contract at same time. Need this in order to refill any of her controlled meds. She is on lyrica and tramadol. So needs opiod and non opiod contract  Please contact her tomorrow 09/17/2019

## 2019-09-17 NOTE — Telephone Encounter (Signed)
Patient is unable to come this week but she stated she will stop by Monday, is the UDS dropped already in her chart?

## 2019-09-17 NOTE — Telephone Encounter (Signed)
No would you put that order in. Reminder update opiod and non opiod contract.

## 2019-09-17 NOTE — Telephone Encounter (Signed)
Labs and contract created

## 2019-09-18 ENCOUNTER — Other Ambulatory Visit: Payer: Self-pay | Admitting: Medical

## 2019-09-18 ENCOUNTER — Other Ambulatory Visit: Payer: Self-pay

## 2019-09-18 ENCOUNTER — Encounter: Payer: Self-pay | Admitting: Medical

## 2019-09-18 ENCOUNTER — Other Ambulatory Visit (INDEPENDENT_AMBULATORY_CARE_PROVIDER_SITE_OTHER): Payer: No Typology Code available for payment source

## 2019-09-18 DIAGNOSIS — F419 Anxiety disorder, unspecified: Secondary | ICD-10-CM | POA: Diagnosis not present

## 2019-09-18 NOTE — Telephone Encounter (Signed)
Patient updated contract and UDS today

## 2019-09-19 LAB — DRUG MONITORING, PANEL 8 WITH CONFIRMATION, URINE
6 Acetylmorphine: NEGATIVE ng/mL (ref <10)
Alcohol Metabolites: NEGATIVE ng/mL
Amphetamines: NEGATIVE ng/mL (ref <500)
Benzodiazepines: NEGATIVE ng/mL (ref <100)
Buprenorphine, Urine: NEGATIVE ng/mL (ref <5)
Cocaine Metabolite: NEGATIVE ng/mL (ref <150)
Creatinine: 72.6 mg/dL
MDMA: NEGATIVE ng/mL (ref <500)
Marijuana Metabolite: NEGATIVE ng/mL (ref <20)
Opiates: NEGATIVE ng/mL (ref <100)
Oxidant: NEGATIVE ug/mL
Oxycodone: NEGATIVE ng/mL (ref <100)
pH: 5.7 (ref 4.5–9.0)

## 2019-09-19 LAB — DM TEMPLATE

## 2019-09-20 ENCOUNTER — Telehealth: Payer: Self-pay | Admitting: Medical

## 2019-09-20 MED ORDER — PREGABALIN 150 MG PO CAPS: 150.0000 mg | ORAL_CAPSULE | Freq: Two times a day (BID) | ORAL | 0 refills | Status: DC

## 2019-09-20 NOTE — Telephone Encounter (Signed)
lyrica rx sent to pt pharmacy.

## 2019-09-20 NOTE — Telephone Encounter (Signed)
09-20-2019. Pt has hx of fibromyalgia. Former pt of Dr. Alwyn Ren. She had difficulty controlling pain historically but recently doing well. She is on lyrica 150 mg twice daily. Also has been on tramadol and doing well.   She has updated uds and contracts late last week per [facility] [employer]. Reviewed uds. I am double checking with [employer] Client on contract as she stated was done but I don't see it in epic.  Controlled med site reviewed.

## 2019-09-20 NOTE — Telephone Encounter (Signed)
Rx lyrica sent to pt pharmacy. She needs in office visit next month for controlled medication visit. Looks like last official visit for controlled med visit was November. She has been seen for other reasons but need controlled med visit.

## 2019-09-22 NOTE — Telephone Encounter (Signed)
Reviewed.  Agree.  Unnamed Zeien R Lowne Chase, DO  

## 2019-09-24 MED ORDER — TRAMADOL HCL 50 MG PO TABS: ORAL_TABLET | ORAL | 0 refills | Status: DC

## 2019-09-24 NOTE — Telephone Encounter (Signed)
Rx tramadol sent to pt pharmacy.Marland Kitchen  Up to date on contract, uds and reviewed control med site.

## 2019-10-21 ENCOUNTER — Other Ambulatory Visit: Payer: Self-pay | Admitting: Medical

## 2019-10-21 NOTE — Telephone Encounter (Signed)
Last OV- 08-14-2019 Last refill- 09-20-19 Next OV- Not scheduled

## 2019-10-22 NOTE — Telephone Encounter (Signed)
I only gave pt 2 weeks of lyrica. I sent a message about 2-4 week ago asking her to schedule controlled med visit. So she needs to get in for visit. Please get her scheduled.  See 6-20 telephone note.

## 2019-10-23 ENCOUNTER — Other Ambulatory Visit: Payer: Self-pay

## 2019-10-23 MED ORDER — ATORVASTATIN CALCIUM 80 MG PO TABS: 80.0000 mg | ORAL_TABLET | Freq: Every day | ORAL | 2 refills | Status: AC

## 2019-10-23 NOTE — Telephone Encounter (Signed)
Called pt and lvm to return call.  

## 2019-10-26 ENCOUNTER — Ambulatory Visit (INDEPENDENT_AMBULATORY_CARE_PROVIDER_SITE_OTHER): Payer: No Typology Code available for payment source | Admitting: Medical

## 2019-10-26 ENCOUNTER — Other Ambulatory Visit: Payer: Self-pay

## 2019-10-26 ENCOUNTER — Telehealth: Payer: Self-pay | Admitting: Medical

## 2019-10-26 VITALS — BP 120/70 | HR 83 | Resp 18 | Ht 66.0 in | Wt 172.0 lb

## 2019-10-26 DIAGNOSIS — G629 Polyneuropathy, unspecified: Secondary | ICD-10-CM

## 2019-10-26 DIAGNOSIS — M25559 Pain in unspecified hip: Secondary | ICD-10-CM | POA: Diagnosis not present

## 2019-10-26 DIAGNOSIS — M797 Fibromyalgia: Secondary | ICD-10-CM

## 2019-10-26 MED ORDER — PREGABALIN 150 MG PO CAPS: 150.0000 mg | ORAL_CAPSULE | Freq: Two times a day (BID) | ORAL | 3 refills | Status: DC

## 2019-10-26 MED ORDER — TRAMADOL HCL 50 MG PO TABS: ORAL_TABLET | ORAL | 3 refills | Status: DC

## 2019-10-26 NOTE — Telephone Encounter (Signed)
Refilled lyrica and tramadol pharmacist Phil at [facility] states tramadol can be refilled up to 5 times on script.
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
## 2019-10-26 NOTE — Patient Instructions (Signed)
Your fibromyalgia is most of time well controlled. Continue both tramadol and lyrica. Up to date on contract and uds.  For hip pain left side recommend see sport med if pain increased. If rt side hurts can put in xray.  Recommend follow up 5 months or as needed. Controlled med contract and uds up to date.

## 2019-11-14 ENCOUNTER — Encounter: Payer: Self-pay | Admitting: Medical

## 2019-11-16 ENCOUNTER — Encounter: Payer: Self-pay | Admitting: Medical

## 2019-12-03 ENCOUNTER — Encounter: Payer: Self-pay | Admitting: Medical

## 2019-12-04 ENCOUNTER — Encounter: Payer: Self-pay | Admitting: Medical

## 2019-12-04 ENCOUNTER — Telehealth: Payer: Self-pay | Admitting: Medical

## 2019-12-04 MED ORDER — PREGABALIN 150 MG PO CAPS: 150.0000 mg | ORAL_CAPSULE | Freq: Two times a day (BID) | ORAL | 1 refills | Status: AC

## 2019-12-04 MED ORDER — TRAMADOL HCL 50 MG PO TABS: ORAL_TABLET | ORAL | 1 refills | Status: AC

## 2019-12-04 NOTE — Telephone Encounter (Signed)
Called [facility] in [place],  to cancel remaining fills on Tramadol and Lyrica. Per [facility]- Pt transferred Tramadol (2x refills) down to [facility], they are unable to transfer the Lyrica but will cancel the 1 refill on file.

## 2019-12-04 NOTE — Telephone Encounter (Signed)
rx tramadol sent to pt pharmacy in [place]. Asked staff to cancel local [place] rx. lyrica was canceled. They were able to transfer the tramadol to new pharamacy.

## 2019-12-04 NOTE — Telephone Encounter (Signed)
Opened to review 

## 2019-12-04 NOTE — Telephone Encounter (Signed)
Opened to transfer rx.

## 2021-02-26 ENCOUNTER — Encounter: Payer: Self-pay | Admitting: Medical

## 2022-07-16 ENCOUNTER — Encounter: Payer: Self-pay | Admitting: *Deleted
